# Patient Record
Sex: Male | Born: 1955
Health system: Southern US, Community
[De-identification: ages and names within clinical notes are randomized; demographics above are authoritative.]

## PROBLEM LIST (undated history)

## (undated) ENCOUNTER — Emergency Department (HOSPITAL_COMMUNITY): Admission: EM | Payer: Medicare Other

## (undated) DIAGNOSIS — E781 Pure hyperglyceridemia: Secondary | ICD-10-CM

## (undated) DIAGNOSIS — K5909 Other constipation: Secondary | ICD-10-CM

## (undated) DIAGNOSIS — H913 Deaf nonspeaking, not elsewhere classified: Secondary | ICD-10-CM

## (undated) DIAGNOSIS — K429 Umbilical hernia without obstruction or gangrene: Secondary | ICD-10-CM

## (undated) DIAGNOSIS — F200 Paranoid schizophrenia: Secondary | ICD-10-CM

## (undated) DIAGNOSIS — F79 Unspecified intellectual disabilities: Secondary | ICD-10-CM

## (undated) DIAGNOSIS — Z862 Personal history of diseases of the blood and blood-forming organs and certain disorders involving the immune mechanism: Secondary | ICD-10-CM

## (undated) DIAGNOSIS — L304 Erythema intertrigo: Secondary | ICD-10-CM

## (undated) HISTORY — DX: Other constipation: K59.09

## (undated) HISTORY — DX: Umbilical hernia without obstruction or gangrene: K42.9

## (undated) HISTORY — DX: Paranoid schizophrenia: F20.0

## (undated) HISTORY — DX: Unspecified intellectual disabilities: F79

## (undated) HISTORY — DX: Personal history of diseases of the blood and blood-forming organs and certain disorders involving the immune mechanism: Z86.2

## (undated) HISTORY — DX: Erythema intertrigo: L30.4

## (undated) HISTORY — DX: Deaf nonspeaking, not elsewhere classified: H91.3

## (undated) HISTORY — DX: Pure hyperglyceridemia: E78.1

---

## 1998-04-03 ENCOUNTER — Encounter: Admission: RE | Admit: 1998-04-03 | Discharge: 1998-04-03 | Payer: Self-pay | Admitting: Internal Medicine

## 1998-07-10 ENCOUNTER — Encounter: Admission: RE | Admit: 1998-07-10 | Discharge: 1998-07-10 | Payer: Self-pay | Admitting: Internal Medicine

## 1998-10-11 ENCOUNTER — Encounter: Admission: RE | Admit: 1998-10-11 | Discharge: 1998-10-11 | Payer: Self-pay | Admitting: Hematology and Oncology

## 1999-03-16 ENCOUNTER — Encounter: Admission: RE | Admit: 1999-03-16 | Discharge: 1999-03-16 | Payer: Self-pay | Admitting: Internal Medicine

## 1999-09-28 ENCOUNTER — Encounter: Admission: RE | Admit: 1999-09-28 | Discharge: 1999-09-28 | Payer: Self-pay | Admitting: Internal Medicine

## 1999-10-19 ENCOUNTER — Encounter: Admission: RE | Admit: 1999-10-19 | Discharge: 1999-10-19 | Payer: Self-pay | Admitting: Internal Medicine

## 2000-01-29 ENCOUNTER — Encounter: Admission: RE | Admit: 2000-01-29 | Discharge: 2000-01-29 | Payer: Self-pay | Admitting: Internal Medicine

## 2000-05-16 ENCOUNTER — Encounter: Admission: RE | Admit: 2000-05-16 | Discharge: 2000-05-16 | Payer: Self-pay | Admitting: Internal Medicine

## 2000-10-30 ENCOUNTER — Encounter: Admission: RE | Admit: 2000-10-30 | Discharge: 2000-10-30 | Payer: Self-pay | Admitting: Hematology and Oncology

## 2000-11-19 ENCOUNTER — Encounter: Admission: RE | Admit: 2000-11-19 | Discharge: 2000-11-19 | Payer: Self-pay | Admitting: Internal Medicine

## 2001-09-14 ENCOUNTER — Encounter: Admission: RE | Admit: 2001-09-14 | Discharge: 2001-09-14 | Payer: Self-pay

## 2001-10-23 ENCOUNTER — Encounter: Admission: RE | Admit: 2001-10-23 | Discharge: 2001-10-23 | Payer: Self-pay | Admitting: Internal Medicine

## 2002-04-20 ENCOUNTER — Emergency Department (HOSPITAL_COMMUNITY): Admission: EM | Admit: 2002-04-20 | Discharge: 2002-04-20 | Payer: Self-pay | Admitting: Emergency Medicine

## 2002-08-27 ENCOUNTER — Encounter: Admission: RE | Admit: 2002-08-27 | Discharge: 2002-08-27 | Payer: Self-pay | Admitting: Internal Medicine

## 2003-04-13 ENCOUNTER — Encounter: Admission: RE | Admit: 2003-04-13 | Discharge: 2003-04-13 | Payer: Self-pay | Admitting: Internal Medicine

## 2003-04-15 ENCOUNTER — Encounter: Admission: RE | Admit: 2003-04-15 | Discharge: 2003-04-15 | Payer: Self-pay | Admitting: Internal Medicine

## 2003-05-11 ENCOUNTER — Encounter: Admission: RE | Admit: 2003-05-11 | Discharge: 2003-05-11 | Payer: Self-pay | Admitting: Internal Medicine

## 2003-05-25 ENCOUNTER — Encounter: Admission: RE | Admit: 2003-05-25 | Discharge: 2003-05-25 | Payer: Self-pay | Admitting: Internal Medicine

## 2003-09-29 ENCOUNTER — Encounter: Admission: RE | Admit: 2003-09-29 | Discharge: 2003-09-29 | Payer: Self-pay | Admitting: Internal Medicine

## 2004-03-16 ENCOUNTER — Encounter: Admission: RE | Admit: 2004-03-16 | Discharge: 2004-03-16 | Payer: Self-pay | Admitting: Internal Medicine

## 2004-03-22 ENCOUNTER — Encounter: Admission: RE | Admit: 2004-03-22 | Discharge: 2004-03-22 | Payer: Self-pay | Admitting: Internal Medicine

## 2004-08-24 ENCOUNTER — Ambulatory Visit (HOSPITAL_COMMUNITY): Admission: RE | Admit: 2004-08-24 | Discharge: 2004-08-24 | Payer: Self-pay | Admitting: Internal Medicine

## 2004-08-24 ENCOUNTER — Ambulatory Visit: Payer: Self-pay | Admitting: Internal Medicine

## 2004-10-24 ENCOUNTER — Ambulatory Visit: Payer: Self-pay | Admitting: Internal Medicine

## 2005-03-08 ENCOUNTER — Ambulatory Visit: Payer: Self-pay | Admitting: Internal Medicine

## 2005-09-24 ENCOUNTER — Ambulatory Visit: Payer: Self-pay | Admitting: Internal Medicine

## 2005-11-15 ENCOUNTER — Emergency Department (HOSPITAL_COMMUNITY): Admission: EM | Admit: 2005-11-15 | Discharge: 2005-11-15 | Payer: Self-pay | Admitting: Emergency Medicine

## 2005-12-27 ENCOUNTER — Ambulatory Visit: Payer: Self-pay | Admitting: Internal Medicine

## 2006-04-01 ENCOUNTER — Ambulatory Visit: Payer: Self-pay | Admitting: Hospitalist

## 2006-08-04 ENCOUNTER — Emergency Department (HOSPITAL_COMMUNITY): Admission: EM | Admit: 2006-08-04 | Discharge: 2006-08-04 | Payer: Self-pay | Admitting: Family Medicine

## 2006-08-28 ENCOUNTER — Ambulatory Visit: Payer: Self-pay | Admitting: Hospitalist

## 2006-10-13 DIAGNOSIS — I10 Essential (primary) hypertension: Secondary | ICD-10-CM | POA: Insufficient documentation

## 2006-10-13 DIAGNOSIS — K029 Dental caries, unspecified: Secondary | ICD-10-CM

## 2006-10-13 DIAGNOSIS — F79 Unspecified intellectual disabilities: Secondary | ICD-10-CM

## 2006-10-13 DIAGNOSIS — H913 Deaf nonspeaking, not elsewhere classified: Secondary | ICD-10-CM

## 2006-10-13 DIAGNOSIS — F200 Paranoid schizophrenia: Secondary | ICD-10-CM | POA: Insufficient documentation

## 2006-11-18 DIAGNOSIS — E785 Hyperlipidemia, unspecified: Secondary | ICD-10-CM

## 2006-11-18 DIAGNOSIS — E1169 Type 2 diabetes mellitus with other specified complication: Secondary | ICD-10-CM | POA: Insufficient documentation

## 2007-05-19 ENCOUNTER — Ambulatory Visit: Payer: Self-pay | Admitting: *Deleted

## 2007-05-19 DIAGNOSIS — K5900 Constipation, unspecified: Secondary | ICD-10-CM | POA: Insufficient documentation

## 2007-06-04 ENCOUNTER — Telehealth: Payer: Self-pay | Admitting: *Deleted

## 2007-06-04 LAB — CONVERTED CEMR LAB
ALT: 19 units/L (ref 0–53)
Alkaline Phosphatase: 61 units/L (ref 39–117)
Calcium: 9.8 mg/dL (ref 8.4–10.5)
Chloride: 97 meq/L (ref 96–112)
Creatinine, Ser: 1.16 mg/dL (ref 0.40–1.50)
Glucose, Bld: 115 mg/dL — ABNORMAL HIGH (ref 70–99)
HCT: 41.5 % (ref 39.0–52.0)
MCV: 75.5 fL — ABNORMAL LOW (ref 78.0–100.0)
Platelets: 274 10*3/uL (ref 150–400)
Total Bilirubin: 0.5 mg/dL (ref 0.3–1.2)
Total Protein: 8.9 g/dL — ABNORMAL HIGH (ref 6.0–8.3)

## 2007-06-08 ENCOUNTER — Encounter (INDEPENDENT_AMBULATORY_CARE_PROVIDER_SITE_OTHER): Payer: Self-pay | Admitting: *Deleted

## 2007-06-08 ENCOUNTER — Ambulatory Visit: Payer: Self-pay | Admitting: Internal Medicine

## 2007-06-08 LAB — CONVERTED CEMR LAB
BUN: 12 mg/dL (ref 6–23)
CO2: 28 meq/L (ref 19–32)
Calcium: 9.8 mg/dL (ref 8.4–10.5)
Glucose, Bld: 109 mg/dL — ABNORMAL HIGH (ref 70–99)

## 2007-09-22 ENCOUNTER — Ambulatory Visit: Payer: Self-pay | Admitting: Hospitalist

## 2007-10-20 ENCOUNTER — Telehealth (INDEPENDENT_AMBULATORY_CARE_PROVIDER_SITE_OTHER): Payer: Self-pay | Admitting: *Deleted

## 2007-11-13 ENCOUNTER — Encounter (INDEPENDENT_AMBULATORY_CARE_PROVIDER_SITE_OTHER): Payer: Self-pay | Admitting: *Deleted

## 2007-11-13 ENCOUNTER — Ambulatory Visit: Payer: Self-pay | Admitting: Internal Medicine

## 2007-11-13 LAB — CONVERTED CEMR LAB
Albumin: 4.6 g/dL (ref 3.5–5.2)
CO2: 25 meq/L (ref 19–32)
Calcium: 9.6 mg/dL (ref 8.4–10.5)
Chloride: 104 meq/L (ref 96–112)
Creatinine, Ser: 1.08 mg/dL (ref 0.40–1.50)
Glucose, Bld: 96 mg/dL (ref 70–99)
LDL Cholesterol: 84 mg/dL (ref 0–99)
Potassium: 3.6 meq/L (ref 3.5–5.3)
Sodium: 143 meq/L (ref 135–145)
Total CHOL/HDL Ratio: 4.5
Total Protein: 8.3 g/dL (ref 6.0–8.3)
Triglycerides: 158 mg/dL — ABNORMAL HIGH (ref <150)
VLDL: 32 mg/dL (ref 0–40)

## 2007-11-29 ENCOUNTER — Emergency Department (HOSPITAL_COMMUNITY): Admission: EM | Admit: 2007-11-29 | Discharge: 2007-11-29 | Payer: Self-pay | Admitting: Emergency Medicine

## 2008-01-29 ENCOUNTER — Telehealth (INDEPENDENT_AMBULATORY_CARE_PROVIDER_SITE_OTHER): Payer: Self-pay | Admitting: *Deleted

## 2008-03-03 ENCOUNTER — Ambulatory Visit: Payer: Self-pay | Admitting: Hospitalist

## 2008-03-07 ENCOUNTER — Encounter: Payer: Self-pay | Admitting: Licensed Clinical Social Worker

## 2008-08-29 ENCOUNTER — Ambulatory Visit: Payer: Self-pay | Admitting: Internal Medicine

## 2008-12-19 ENCOUNTER — Encounter (INDEPENDENT_AMBULATORY_CARE_PROVIDER_SITE_OTHER): Payer: Self-pay | Admitting: Internal Medicine

## 2008-12-19 ENCOUNTER — Emergency Department (HOSPITAL_COMMUNITY): Admission: EM | Admit: 2008-12-19 | Discharge: 2008-12-19 | Payer: Self-pay | Admitting: Family Medicine

## 2008-12-26 ENCOUNTER — Ambulatory Visit: Payer: Self-pay | Admitting: Internal Medicine

## 2008-12-26 ENCOUNTER — Encounter (INDEPENDENT_AMBULATORY_CARE_PROVIDER_SITE_OTHER): Payer: Self-pay | Admitting: *Deleted

## 2008-12-29 ENCOUNTER — Telehealth (INDEPENDENT_AMBULATORY_CARE_PROVIDER_SITE_OTHER): Payer: Self-pay | Admitting: *Deleted

## 2009-01-06 ENCOUNTER — Telehealth (INDEPENDENT_AMBULATORY_CARE_PROVIDER_SITE_OTHER): Payer: Self-pay | Admitting: *Deleted

## 2009-01-16 ENCOUNTER — Telehealth (INDEPENDENT_AMBULATORY_CARE_PROVIDER_SITE_OTHER): Payer: Self-pay | Admitting: *Deleted

## 2009-01-18 ENCOUNTER — Telehealth (INDEPENDENT_AMBULATORY_CARE_PROVIDER_SITE_OTHER): Payer: Self-pay | Admitting: *Deleted

## 2009-03-15 ENCOUNTER — Telehealth (INDEPENDENT_AMBULATORY_CARE_PROVIDER_SITE_OTHER): Payer: Self-pay | Admitting: *Deleted

## 2009-04-04 ENCOUNTER — Encounter (INDEPENDENT_AMBULATORY_CARE_PROVIDER_SITE_OTHER): Payer: Self-pay | Admitting: *Deleted

## 2009-04-04 ENCOUNTER — Ambulatory Visit: Payer: Self-pay | Admitting: Infectious Disease

## 2009-04-04 LAB — CONVERTED CEMR LAB
ALT: 26 units/L (ref 0–53)
AST: 22 units/L (ref 0–37)
Albumin: 4.7 g/dL (ref 3.5–5.2)
BUN: 15 mg/dL (ref 6–23)
Calcium: 9.5 mg/dL (ref 8.4–10.5)
Chloride: 106 meq/L (ref 96–112)
HDL: 42 mg/dL (ref >39)
Potassium: 3.6 meq/L (ref 3.5–5.3)
Total Bilirubin: 0.3 mg/dL (ref 0.3–1.2)
Total CHOL/HDL Ratio: 4.2
Triglycerides: 209 mg/dL — ABNORMAL HIGH (ref <150)

## 2009-07-24 ENCOUNTER — Encounter: Payer: Self-pay | Admitting: Internal Medicine

## 2009-09-19 ENCOUNTER — Telehealth: Payer: Self-pay | Admitting: Internal Medicine

## 2009-09-21 ENCOUNTER — Ambulatory Visit: Payer: Self-pay | Admitting: Internal Medicine

## 2009-10-18 ENCOUNTER — Ambulatory Visit: Payer: Self-pay | Admitting: Infectious Disease

## 2009-10-18 DIAGNOSIS — J309 Allergic rhinitis, unspecified: Secondary | ICD-10-CM | POA: Insufficient documentation

## 2009-10-18 LAB — CONVERTED CEMR LAB
BUN: 13 mg/dL (ref 6–23)
Calcium: 9.2 mg/dL (ref 8.4–10.5)

## 2009-10-23 ENCOUNTER — Telehealth: Payer: Self-pay | Admitting: Internal Medicine

## 2009-11-30 ENCOUNTER — Telehealth (INDEPENDENT_AMBULATORY_CARE_PROVIDER_SITE_OTHER): Payer: Self-pay | Admitting: *Deleted

## 2009-12-08 ENCOUNTER — Telehealth: Payer: Self-pay | Admitting: Internal Medicine

## 2010-01-01 ENCOUNTER — Telehealth: Payer: Self-pay | Admitting: Internal Medicine

## 2010-01-08 ENCOUNTER — Telehealth: Payer: Self-pay | Admitting: Internal Medicine

## 2010-01-19 ENCOUNTER — Telehealth: Payer: Self-pay | Admitting: Internal Medicine

## 2010-03-20 ENCOUNTER — Ambulatory Visit: Payer: Self-pay | Admitting: Internal Medicine

## 2010-03-20 LAB — CONVERTED CEMR LAB
ALT: 30 units/L (ref 0–53)
Albumin: 4.6 g/dL (ref 3.5–5.2)
Alkaline Phosphatase: 78 units/L (ref 39–117)
Calcium: 9.3 mg/dL (ref 8.4–10.5)
Cholesterol: 178 mg/dL (ref 0–200)
LDL Cholesterol: 87 mg/dL (ref 0–99)
Total Bilirubin: 0.3 mg/dL (ref 0.3–1.2)
Total Protein: 8.2 g/dL (ref 6.0–8.3)
Triglycerides: 232 mg/dL — ABNORMAL HIGH (ref <150)
VLDL: 46 mg/dL — ABNORMAL HIGH (ref 0–40)

## 2010-03-27 DIAGNOSIS — E876 Hypokalemia: Secondary | ICD-10-CM | POA: Insufficient documentation

## 2010-06-22 ENCOUNTER — Telehealth: Payer: Self-pay | Admitting: Internal Medicine

## 2010-06-27 ENCOUNTER — Ambulatory Visit: Payer: Self-pay | Admitting: Internal Medicine

## 2010-06-27 LAB — CONVERTED CEMR LAB
BUN: 14 mg/dL (ref 6–23)
Chloride: 101 meq/L (ref 96–112)

## 2010-09-04 ENCOUNTER — Ambulatory Visit: Payer: Self-pay | Admitting: Internal Medicine

## 2010-11-07 ENCOUNTER — Ambulatory Visit: Payer: Self-pay

## 2010-12-07 ENCOUNTER — Ambulatory Visit: Admission: RE | Admit: 2010-12-07 | Discharge: 2010-12-07 | Payer: Self-pay | Source: Home / Self Care

## 2010-12-09 LAB — CONVERTED CEMR LAB
Calcium: 9.3 mg/dL (ref 8.4–10.5)
Chloride: 103 meq/L (ref 96–112)
Creatinine, Ser: 1.04 mg/dL (ref 0.40–1.50)
Sodium: 142 meq/L (ref 135–145)

## 2011-01-01 NOTE — Progress Notes (Signed)
Summary: med refill/gp  Phone Note Refill Request Message from:  Pharmacy on January 19, 2010 3:53 PM  Refills Requested: Medication #1:  LORATADINE 10 MG TABS Take 1 tablet by mouth once a day  Method Requested: Electronic Initial call taken by: Chinita Pester RN,  January 19, 2010 3:53 PM  Follow-up for Phone Call        Refill approved-nurse to complete    Prescriptions: LORATADINE 10 MG TABS (LORATADINE) Take 1 tablet by mouth once a day  #31 x 3   Entered and Authorized by:   Vassie Loll MD   Signed by:   Vassie Loll MD on 01/19/2010   Method used:   [facility]        [facility] [phone][facility] (retail)       [address]       [place], [place]  [address]       Ph: [phone] or [phone]       Fax: [phone]   RxID:   [id number]

## 2011-01-01 NOTE — Assessment & Plan Note (Signed)
Summary: checkup/pcp-Leonard Tucker/hla   Vital Signs:  Patient profile:   55 year old male Height:      67 inches Weight:      197.1 pounds BMI:     30.98 Temp:     99.3 degrees F oral Pulse rate:   76 / minute BP sitting:   110 / 70  (right arm)  Vitals Entered By: Filomena Jungling NT II (March 20, 2010 2:12 PM) CC: checkup visit Is Patient Diabetic? No Pain Assessment Patient in pain? no      Nutritional Status BMI of > 30 = obese  Have you ever been in a relationship where you felt threatened, hurt or afraid?No   Does patient need assistance? Functional Status Self care Ambulation Normal Comments lives with mother   Primary Care Provider:  [name]  CC:  checkup visit.  History of Present Illness: Leonard Tucker is a 55 y/o man who is hearing and speech impaired and has mental retardation with chronic paranoid schizophrenia (treated at [facility]). He is here with his mother Leonard Tucker) who is also his caregiver to f/u on chronic medical problems of HTN and HLD.  Patient is doing pretty good in general and denies any complaints at this point.   Patient reports to be compliant with his medications and her mother reports that he is doing better regarding healthier food choices and with portion control.  No further episodes of constipation.  BP 110/70  Preventive Screening-Counseling & Management  Alcohol-Tobacco     Alcohol drinks/day: 0     Smoking Status: never  Caffeine-Diet-Exercise     Caffeine use/day: 2  Problems Prior to Update: 1)  Allergic Rhinitis  (ICD-477.9) 2)  Constipation  (ICD-564.00) 3)  Hypertriglyceridemia  (ICD-272.1) 4)  Preventive Health Care  (ICD-V70.0) 5)  Deaf Mutism  (ICD-389.7) 6)  Paranoid Schizophrenia, Chronic  (ICD-295.32) 7)  Mental Retardation  (ICD-319) 8)  Dental Caries  (ICD-521.00) 9)  Hypertension  (ICD-401.9)  Current Problems (verified): 1)  Allergic Rhinitis  (ICD-477.9) 2)  Constipation   (ICD-564.00) 3)  Hypertriglyceridemia  (ICD-272.1) 4)  Preventive Health Care  (ICD-V70.0) 5)  Deaf Mutism  (ICD-389.7) 6)  Paranoid Schizophrenia, Chronic  (ICD-295.32) 7)  Mental Retardation  (ICD-319) 8)  Dental Caries  (ICD-521.00) 9)  Hypertension  (ICD-401.9)  Medications Prior to Update: 1)  Dyazide 37.5-25 Mg Caps (Triamterene-Hctz) .... Take 1 Tablet By Mouth Once A Day 2)  Atenolol 25 Mg Tabs (Atenolol) .... Take 1 Tablet By Mouth Once A Day 3)  Fluphenazine Hcl  Elix (Fluphenazine Hcl Elix) .... Depo Injections At [facility] Every 2 Weeks 4)  Cvs Stool Softener 100 Mg Caps (Docusate Sodium) .... Take 1 Tablet By Mouth Two Times A Day 5)  Omeprazole 20 Mg Cpdr (Omeprazole) .... Take 1 Tablet By Mouth Once A Day 6)  Loratadine 10 Mg Tabs (Loratadine) .... Take 1 Tablet By Mouth Once A Day 7)  Nasonex 50 Mcg/act Susp (Mometasone Furoate) .... 2 Spray Inside Each Nostril Every Day. 8)  Cheratussin Ac 100-10 Mg/46ml Syrp (Guaifenesin-Codeine) .Marland Kitchen.. 1-2 Teaspoon Every 12 Hours As Needed For Cough.  Current Medications (verified): 1)  Dyazide 37.5-25 Mg Caps (Triamterene-Hctz) .... Take 1 Tablet By Mouth Once A Day 2)  Atenolol 25 Mg Tabs (Atenolol) .... Take 1 Tablet By Mouth Once A Day 3)  Fluphenazine Hcl  Elix (Fluphenazine Hcl Elix) .... Depo Injections At [facility] Every 2 Weeks 4)  Cvs Stool Softener 100 Mg  Caps (Docusate Sodium) .... Take 1 Tablet By Mouth Two Times A Day 5)  Omeprazole 20 Mg Cpdr (Omeprazole) .... Take 1 Tablet By Mouth Once A Day 6)  Loratadine 10 Mg Tabs (Loratadine) .... Take 1 Tablet By Mouth Once A Day 7)  Nasonex 50 Mcg/act Susp (Mometasone Furoate) .... 2 Spray Inside Each Nostril Every Day. 8)  Cheratussin Ac 100-10 Mg/2ml Syrp (Guaifenesin-Codeine) .Marland Kitchen.. 1-2 Teaspoon Every 12 Hours As Needed For Cough.  Allergies (verified): 1)  ! * Dextromethorphan  Past History:  Past Medical History: Last updated: 04/04/2009 DEAF MUTISM with  MENTAL RETARDATION CHRONIC PARANOID SCHIZOPHRENIA    - goes to [facility] Health q 2 weeks for his fluphenazine injections. CHRONIC CONSTIPATION     a) complicated by HEMORROIDS    b) intermittent rectal bleeding HYPERTRIGLYCERIDEMIA DENTAL CARIES INTERTRIGO, tx'd in 2008 UMBILICAL HERNIA    - 1.5 cm diameter Hx of Iron deficiency anemia  Social History: Last updated: 11/13/2007 His mother, Leonard Tucker is his primary caretaker.  Risk Factors: Alcohol Use: 0 (03/20/2010) Caffeine Use: 2 (03/20/2010)  Risk Factors: Smoking Status: never (03/20/2010)  Review of Systems  The patient denies anorexia, fever, chest pain, syncope, dyspnea on exertion, prolonged cough, abdominal pain, melena, hematochezia, severe indigestion/heartburn, depression, and unusual weight change.    Physical Exam  General:  Middle-aged man in NAD. Alert, well-developed, well-nourished. Communicates with his mother by simplified sign language. Lungs:  normal respiratory effort, no intercostal retractions, no accessory muscle use, normal breath sounds, no crackles, and no wheezes.   Heart:  normal rate, regular rhythm, and no murmur.   Abdomen:  soft, non-tender, normal bowel sounds, no distention, no masses, no guarding, no rigidity, no hepatomegaly, and no splenomegaly. Positive umbilical hernia w/o signs of strangulation. Extremities:  No clubbing, cyanosis, edema, or deformity noted with normal full range of motion of all joints.   Neurologic:  Alert, awake & oriented. CN III-XII intact (except for his hearing impairment). normal gait. no focal deficit.   Impression & Recommendations:  Problem # 1:  ALLERGIC RHINITIS (ICD-477.9) Well controlled and stable with current regimen. will continue using loratadine and nasonex. Patient advised to avoid extended period of time outdoor during spring (in order to prevent over-exposure to pollen).  His updated medication list for this problem includes:    Loratadine  10 Mg Tabs (Loratadine) .Marland Kitchen... Take 1 tablet by mouth once a day    Nasonex 50 Mcg/act Susp (Mometasone furoate) .Marland Kitchen... 2 spray inside each nostril every day.  Problem # 2:  HYPERTENSION (ICD-401.9) Excellent controlled. No medication changes needed at this point. Patient renal function WNL and except for mild low potassium level all his electrolytes are stable.  His updated medication list for this problem includes:    Dyazide 37.5-25 Mg Caps (Triamterene-hctz) .Marland Kitchen... Take 1 tablet by mouth once a day    Atenolol 25 Mg Tabs (Atenolol) .Marland Kitchen... Take 1 tablet by mouth once a day  Problem # 3:  HYPERTRIGLYCERIDEMIA (ICD-272.1) Patient LDL was checked postprandially today and was 232; which is mildly elevated but not high enough to granted medication therapy. Will recommend low fat diet and exercise for now. LDL and HDL was in desirable level. Will recheck lipid profile in 6-8 months again.  Orders: T-Lipid Profile ([id number])  Problem # 4:  PARANOID SCHIZOPHRENIA, CHRONIC (ICD-295.32) Well controlled and tsble. He received his fluphenazine injection today w/o complications. He will continue following at mental health center. No SI or hallucinations appreciated.  Problem # 5:  HYPOKALEMIA (ICD-276.8) Most likely due to diuretics use. Will prescribed potassium by mouth times 1 dose and will start potassium by mouth daily for maintenance.  Complete Medication List: 1)  Dyazide 37.5-25 Mg Caps (Triamterene-hctz) .... Take 1 tablet by mouth once a day 2)  Atenolol 25 Mg Tabs (Atenolol) .... Take 1 tablet by mouth once a day 3)  Fluphenazine Hcl Elix (Fluphenazine hcl elix) .... Depo injections at behavioral health every 2 weeks 4)  Cvs Stool Softener 100 Mg Caps (Docusate sodium) .... Take 1 tablet by mouth two times a day 5)  Omeprazole 20 Mg Cpdr (Omeprazole) .... Take 1 tablet by mouth once a day 6)  Loratadine 10 Mg Tabs (Loratadine) .... Take 1 tablet by mouth once a day 7)   Nasonex 50 Mcg/act Susp (Mometasone furoate) .... 2 spray inside each nostril every day. 8)  Cheratussin Ac 100-10 Mg/83ml Syrp (Guaifenesin-codeine) .Marland Kitchen.. 1-2 teaspoon every 12 hours as needed for cough. 9)  Potassium Chloride Cr 10 Meq Cr-tabs (Potassium chloride) .... Start taking 1 tablet by mouth daily.  Other Orders: T-Comprehensive Metabolic Panel ([id number])  Patient Instructions: 1)  Please schedule a follow-up appointment in 6 months. 2)  Take your medications as prescribed. 3)  Follow a low fat diet. 4)  Limit your Sodium intake (Salt). 5)  You will be called with any abnormalities in the tests scheduled or performed today.  If you don't hear from [place] within a week from when the test was performed, you can assume that your test was normal. Prescriptions: POTASSIUM CHLORIDE CR 10 MEQ CR-TABS (POTASSIUM CHLORIDE) Start taking 1 tablet by mouth daily.  #31 x 4   Entered and Authorized by:   Vassie Loll MD   Signed by:   Vassie Loll MD on 03/27/2010   Method used:   [facility] to        [facility] [phone]* (retail)       [address]       [place], [place]  [address]       Ph: [phone] or [phone]       Fax: [phone]   RxID:   [id number]  Process Orders Check Orders Results:     [facility]: Check successful Tests Sent for requisitioning (March 27, 2010 11:32 AM):     03/20/2010: [facility] -- T-Lipid Profile [phone] (signed)     03/20/2010: [facility] -- T-Comprehensive Metabolic Panel [phone] (signed)    Prevention & Chronic Care Immunizations   Influenza vaccine: Fluvax MCR  (09/21/2009)   Influenza vaccine deferral: Deferred  (03/20/2010)    Tetanus booster: 10/18/2009: Tdap   Td booster deferral: Deferred  (03/20/2010)   Tetanus booster due: 10/19/2019    Pneumococcal vaccine: Not documented   Pneumococcal vaccine deferral: Deferred   (03/20/2010)  Colorectal Screening   Hemoccult: Not documented    Colonoscopy: Not documented   Colonoscopy action/deferral: Deferred  (03/20/2010)  Other Screening   PSA: Not documented   PSA action/deferral: Discussed-PSA declined  (03/20/2010)  Reports requested:   Last colonoscopy report requested.  Smoking status: never  (03/20/2010)  Lipids   Total Cholesterol: 178  (04/04/2009)   Lipid panel action/deferral: Lipid Panel ordered   LDL: 94  (04/04/2009)   LDL Direct: Not documented   HDL: 42  (04/04/2009)   Triglycerides: 209  (04/04/2009)    SGOT (AST): 22  (04/04/2009)   BMP action: Ordered  SGPT (ALT): 26  (04/04/2009) CMP ordered    Alkaline phosphatase: 67  (04/04/2009)   Total bilirubin: 0.3  (04/04/2009)    Lipid flowsheet reviewed?: Yes   Progress toward LDL goal: At goal  Hypertension   Last Blood Pressure: 110 / 70  (03/20/2010)   Serum creatinine: 1.09  (10/18/2009)   BMP action: Ordered   Serum potassium 3.3  (10/18/2009) CMP ordered     Hypertension flowsheet reviewed?: Yes   Progress toward BP goal: At goal  Self-Management Support :   Personal Goals (by the next clinic visit) :      Personal blood pressure goal: 140/90  (03/20/2010)     Personal LDL goal: 130  (03/20/2010)    Patient will work on the following items until the next clinic visit to reach self-care goals:     Medications and monitoring: take my medicines every day, bring all of my medications to every visit  (03/20/2010)     Eating: eat more vegetables, use fresh or frozen vegetables, eat foods that are low in salt, eat baked foods instead of fried foods, eat fruit for snacks and desserts, limit or avoid alcohol  (03/20/2010)     Activity: take a 30 minute walk every day, take the stairs instead of the elevator, park at the far end of the parking lot  (03/20/2010)    Hypertension self-management support: Written self-care plan  (03/20/2010)   Hypertension self-care plan  printed.    Lipid self-management support: Written self-care plan  (03/20/2010)   Lipid self-care plan printed.   Nursing Instructions: Request report of last colonoscopy

## 2011-01-01 NOTE — Progress Notes (Signed)
Summary: med refill/gp  Phone Note Refill Request Message from:  Fax from Pharmacy on December 08, 2009 10:26 AM  Refills Requested: Medication #1:  ATENOLOL 25 MG TABS Take 1 tablet by mouth once a day   Last Refilled: 10/06/2009  Method Requested: Electronic Initial call taken by: Chinita Pester RN,  December 08, 2009 10:26 AM  Follow-up for Phone Call        Refill approved-nurse to complete    Prescriptions: ATENOLOL 25 MG TABS (ATENOLOL) Take 1 tablet by mouth once a day  #31 x 3   Entered and Authorized by:   Vassie Loll MD   Signed by:   Vassie Loll MD on 12/08/2009   Method used:   Electronically to        [facility] [phone]* (retail)       [address]       [place], [place]  [address]       Ph: [phone] or [phone]       Fax: [phone]   RxID:   [id number]

## 2011-01-01 NOTE — Assessment & Plan Note (Signed)
Summary: potassium refill//kg  Nurse Visit   Allergies: 1)  ! * Dextromethorphan  Impression & Recommendations:  Problem # 1:  HYPOKALEMIA (ICD-276.8) Patient is supposed to continue taking his potassium pills, in order to maintain electrolytes in normal range. Cause of hypokalemia is the use of diuretics for his HTN treatment. [facility]  Complete Medication List: 1)  Dyazide 37.5-25 Mg Caps (Triamterene-hctz) .... Take 1 tablet by mouth once a day 2)  Atenolol 25 Mg Tabs (Atenolol) .... Take 1 tablet by mouth once a day 3)  Fluphenazine Hcl Elix (Fluphenazine hcl elix) .... Depo injections at behavioral health every 2 weeks 4)  Cvs Stool Softener 100 Mg Caps (Docusate sodium) .... Take 1 tablet by mouth two times a day 5)  Omeprazole 20 Mg Cpdr (Omeprazole) .... Take 1 tablet by mouth once a day 6)  Loratadine 10 Mg Tabs (Loratadine) .... Take 1 tablet by mouth once a day 7)  Nasonex 50 Mcg/act Susp (Mometasone furoate) .... 2 spray inside each nostril every day. 8)  Cheratussin Ac 100-10 Mg/85ml Syrp (Guaifenesin-codeine) .Marland Kitchen.. 1-2 teaspoon every 12 hours as needed for cough. 9)  Potassium Chloride Cr 10 Meq Cr-tabs (Potassium chloride) .... Start taking 1 tablet by mouth daily.  Other Orders: Flu Vaccine 69yrs + MEDICARE PATIENTS (Z6109) Administration Flu vaccine - MCR (G0008)   Orders Added: 1)  Flu Vaccine 44yrs + MEDICARE PATIENTS [Q2039] 2)  Administration Flu vaccine - MCR [G0008] Prescriptions: POTASSIUM CHLORIDE CR 10 MEQ CR-TABS (POTASSIUM CHLORIDE) Start taking 1 tablet by mouth daily.  #31 x 6   Entered and Authorized by:   Vassie Loll MD   Signed by:   Vassie Loll MD on 09/04/2010   Method used:   Electronically to        [facility] [phone]* (retail)       [address]       [place], [place]  [address]       Ph: [phone] or [phone]       Fax: [phone]   RxID:    [id number]  Flu Vaccine Consent Questions     Do you have a history of severe allergic reactions to this vaccine? no    Any prior history of allergic reactions to egg and/or gelatin? no    Do you have a sensitivity to the preservative Thimersol? no    Do you have a past history of Guillan-Barre Syndrome? no    Do you currently have an acute febrile illness? no    Have you ever had a severe reaction to latex? no    Vaccine information given and explained to patient? yes    Are you currently pregnant? no    Lot Number:[id number]   Exp Date:06/01/2011   Manufacturer: [employer]    Site Given  Left Deltoid IM.Cynda Familia [facility])  September 04, 2010 2:50 PM Phone Note Other Incoming   Summary of Call: Pt here for flu vaccine and pt's mother presents an empty on potassium 10mg . Per pharmacy last filled in Aug 2011.  Pt's mother wants to know if pt still needs to take, please advise and send in rx accordingly, [facility] [place] Initial call taken by: Cynda Familia [facility]ull),  September 04, 2010 2:53 PM    .Neysa Bonito

## 2011-01-01 NOTE — Assessment & Plan Note (Signed)
Summary: FU VISIT/DS   Vital Signs:  Patient profile:   55 year old male Height:      67 inches (170.18 cm) Weight:      201.9 pounds (91.77 kg) BMI:     31.74 Temp:     97.9 degrees F (36.61 degrees C) oral Pulse rate:   97 / minute BP sitting:   131 / 81  (left arm) Cuff size:   regular  Vitals Entered By: Cynda Familia Duncan Dull) (June 27, 2010 10:11 AM) CC: here for routine f/u, has not been taking atenolol, insurance will not pay for loratadine, needs something different Is Patient Diabetic? No Pain Assessment Patient in pain? no      Nutritional Status BMI of > 30 = obese  Have you ever been in a relationship where you felt threatened, hurt or afraid?Unable to ask   Does patient need assistance? Functional Status Cook/clean, Shopping, Social activities Ambulation Normal Comments pt is a deaf/mute   Primary Care Provider:  [name]  CC:  here for routine f/u, has not been taking atenolol, insurance will not pay for loratadine, and needs something different.  History of Present Illness: Leonard Tucker is a 55 y/o man who is hearing and speech impaired and has mental retardation with chronic paranoid schizophrenia (treated at [facility]). He is here with his mother Leonard Tucker) who is also his caregiver to f/u on chronic medical problems.  Patient is doing pretty good in general and denies any complaints at this point.   They are having some issues with the loratadine and inssurance coverage and they are requesting for a different drug.  Patient also reports that after stopping his loratadine for couple of days, he has been coughing in the am (dry cough).  Preventive Screening-Counseling & Management  Alcohol-Tobacco     Alcohol drinks/day: 0     Smoking Status: never  Problems Prior to Update: 1)  Hypokalemia  (ICD-276.8) 2)  Allergic Rhinitis  (ICD-477.9) 3)  Constipation  (ICD-564.00) 4)  Hypertriglyceridemia  (ICD-272.1) 5)  Preventive  Health Care  (ICD-V70.0) 6)  Deaf Mutism  (ICD-389.7) 7)  Paranoid Schizophrenia, Chronic  (ICD-295.32) 8)  Mental Retardation  (ICD-319) 9)  Dental Caries  (ICD-521.00) 10)  Hypertension  (ICD-401.9)  Current Problems (verified): 1)  Hypokalemia  (ICD-276.8) 2)  Allergic Rhinitis  (ICD-477.9) 3)  Constipation  (ICD-564.00) 4)  Hypertriglyceridemia  (ICD-272.1) 5)  Preventive Health Care  (ICD-V70.0) 6)  Deaf Mutism  (ICD-389.7) 7)  Paranoid Schizophrenia, Chronic  (ICD-295.32) 8)  Mental Retardation  (ICD-319) 9)  Dental Caries  (ICD-521.00) 10)  Hypertension  (ICD-401.9)  Medications Prior to Update: 1)  Dyazide 37.5-25 Mg Caps (Triamterene-Hctz) .... Take 1 Tablet By Mouth Once A Day 2)  Atenolol 25 Mg Tabs (Atenolol) .... Take 1 Tablet By Mouth Once A Day 3)  Fluphenazine Hcl  Elix (Fluphenazine Hcl Elix) .... Depo Injections At [facility] Every 2 Weeks 4)  [facility]vs Stool Softener 100 Mg Caps (Docusate Sodium) .... Take 1 Tablet By Mouth Two Times A Day 5)  Omeprazole 20 Mg Cpdr (Omeprazole) .... Take 1 Tablet By Mouth Once A Day 6)  Loratadine 10 Mg Tabs (Loratadine) .... Take 1 Tablet By Mouth Once A Day 7)  Nasonex 50 Mcg/act Susp (Mometasone Furoate) .... 2 Spray Inside Each Nostril Every Day. 8)  Cheratussin Ac 100-10 Mg/14ml Syrp (Guaifenesin-Codeine) .Marland Kitchen.. 1-2 Teaspoon Every 12 Hours As Needed For Cough. 9)  Potassium Chloride Cr 10 Meq  Cr-Tabs (Potassium Chloride) .... Start Taking 1 Tablet By Mouth Daily.  Current Medications (verified): 1)  Dyazide 37.5-25 Mg Caps (Triamterene-Hctz) .... Take 1 Tablet By Mouth Once A Day 2)  Atenolol 25 Mg Tabs (Atenolol) .... Take 1 Tablet By Mouth Once A Day 3)  Fluphenazine Hcl  Elix (Fluphenazine Hcl Elix) .... Depo Injections At [facility] Every 2 Weeks 4)  [facility]vs Stool Softener 100 Mg Caps (Docusate Sodium) .... Take 1 Tablet By Mouth Two Times A Day 5)  Omeprazole 20 Mg Cpdr (Omeprazole) .... Take 1 Tablet By Mouth Once A  Day 6)  Loratadine 10 Mg Tabs (Loratadine) .... Take 1 Tablet By Mouth Once A Day 7)  Nasonex 50 Mcg/act Susp (Mometasone Furoate) .... 2 Spray Inside Each Nostril Every Day. 8)  Cheratussin Ac 100-10 Mg/87ml Syrp (Guaifenesin-Codeine) .Marland Kitchen.. 1-2 Teaspoon Every 12 Hours As Needed For Cough. 9)  Potassium Chloride Cr 10 Meq Cr-Tabs (Potassium Chloride) .... Start Taking 1 Tablet By Mouth Daily.  Allergies: 1)  ! * Dextromethorphan  Past History:  Past Medical History: Last updated: 04/04/2009 DEAF MUTISM with MENTAL RETARDATION CHRONIC PARANOID SCHIZOPHRENIA    - [facility] to [facility] 2 [facility] for his fluphenazine injections. CHRONIC CONSTIPATION     a) complicated by HEMORROIDS    b) intermittent rectal bleeding HYPERTRIGLYCERIDEMIA DENTAL CARIES INTERTRIGO, tx'd in 2008 UMBILICAL HERNIA    - 1.5 cm diameter Hx of Iron deficiency anemia  Social History: Last updated: 11/13/2007 His mother, Leonard Tucker is his primary caretaker.  Risk Factors: Alcohol Use: 0 (06/27/2010) Caffeine Use: 2 (03/20/2010)  Risk Factors: Smoking Status: never (06/27/2010)  Review of Systems       As per HPI.  Physical Exam  General:  Middle-aged man in NAD. Alert, well-developed, well-nourished. Communicates with his mother by simplified sign language. Lungs:  normal respiratory effort, no intercostal retractions, no accessory muscle use, normal breath sounds, no crackles, and no wheezes.   Heart:  normal rate, regular rhythm, and no murmur.   Abdomen:  soft, non-tender, normal bowel sounds, no distention, no masses, no guarding, no rigidity, no hepatomegaly, and no splenomegaly. Positive umbilical hernia w/o signs of strangulation. Extremities:  No clubbing, cyanosis, edema, or deformity noted with normal full range of motion of all joints.   Neurologic:  Alert, awake & oriented. CN III-XII intact (except for his hearing impairment). normal gait. no focal deficit.   Impression &  Recommendations:  Problem # 1:  HYPOKALEMIA (ICD-276.8) Patient potassium was checked today, and was 3.4; talking to mother she said that for the last 3 days he has not take his potassium; because is to big. Patient and family has been instructed to be compliant with medications and educated about how to cut in half or even crushed for esier swallowing process. They reports to be compliant with medication as directed.  Orders: T-Basic Metabolic Panel [id number])  Problem # 2:  ALLERGIC RHINITIS (ICD-477.9) Patient has stopped loratadine for while due to price. Mother finally got medication; patient advised on how to use it and to call us if symptoms failed to improved. Mom will call insurance and will find out coverage for allegra and alavert; will change to any of them if better prices and cheaper copay is achieved.  His updated medication list for this problem includes:    Loratadine 10 Mg Tabs (Loratadine) .Marland Kitchen... Take 1 tablet by mouth once a day    Nasonex 50 Mcg/act Susp (Mometasone furoate) .Marland Kitchen... 2 spray inside each  nostril every day.  Problem # 3:  HYPERTENSION (ICD-401.9) Well controlled. will continue current regimen'. Patient advised to follow a low sodium diet.  His updated medication list for this problem includes:    Dyazide 37.5-25 Mg Caps (Triamterene-hctz) .Marland Kitchen... Take 1 tablet by mouth once a day    Atenolol 25 Mg Tabs (Atenolol) .Marland Kitchen... Take 1 tablet by mouth once a day  Problem # 4:  HYPERTRIGLYCERIDEMIA (ICD-272.1) Will continue encouraging him to follow a low fat diet and to lose weight. Last labs demonstarted improved on his lipid profile; will hold adding any medications at this time for high triglycerides.  Complete Medication List: 1)  Dyazide 37.5-25 Mg Caps (Triamterene-hctz) .... Take 1 tablet by mouth once a day 2)  Atenolol 25 Mg Tabs (Atenolol) .... Take 1 tablet by mouth once a day 3)  Fluphenazine Hcl Elix (Fluphenazine hcl elix) .... Depo injections at  behavioral health every 2 weeks 4)  Cvs Stool Softener 100 Mg Caps (Docusate sodium) .... Take 1 tablet by mouth two times a day 5)  Omeprazole 20 Mg Cpdr (Omeprazole) .... Take 1 tablet by mouth once a day 6)  Loratadine 10 Mg Tabs (Loratadine) .... Take 1 tablet by mouth once a day 7)  Nasonex 50 Mcg/act Susp (Mometasone furoate) .... 2 spray inside each nostril every day. 8)  Cheratussin Ac 100-10 Mg/56ml Syrp (Guaifenesin-codeine) .Marland Kitchen.. 1-2 teaspoon every 12 hours as needed for cough. 9)  Potassium Chloride Cr 10 Meq Cr-tabs (Potassium chloride) .... Start taking 1 tablet by mouth daily.  Patient Instructions: 1)  Please schedule a follow-up appointment in 4 months. 2)  Take your medications as prescribed. 3)  Follow a low fat diet. 4)  Limit your Sodium intake (Salt). 5)  You will be called with any abnormalities in the tests scheduled or performed today.  If you don't hear from [place] within a week from when the test was performed, you can assume that your test was normal. 6)  Remember to ask the pharmacy about medication coverage by Medicare (loratadine Vs Alavert). Process Orders Check Orders Results:     [facility]: Check successful Tests Sent for requisitioning (July 08, 2010 8:16 PM):     06/27/2010: [facility] -- T-Basic Metabolic Panel [phone] (signed)    Prevention & Chronic Care Immunizations   Influenza vaccine: Fluvax MCR  (09/21/2009)   Influenza vaccine deferral: Deferred  (03/20/2010)    Tetanus booster: 10/18/2009: Tdap   Td booster deferral: Deferred  (03/20/2010)   Tetanus booster due: 10/19/2019    Pneumococcal vaccine: Not documented   Pneumococcal vaccine deferral: Deferred  (03/20/2010)  Colorectal Screening   Hemoccult: Not documented    Colonoscopy: Not documented   Colonoscopy action/deferral: Deferred  (03/20/2010)  Other Screening   PSA: Not documented   PSA action/deferral: Discussed-PSA declined   (03/20/2010)   Smoking status: never  (06/27/2010)  Lipids   Total Cholesterol: 178  (03/20/2010)   Lipid panel action/deferral: Lipid Panel ordered   LDL: 87  (03/20/2010)   LDL Direct: Not documented   HDL: 45  (03/20/2010)   Triglycerides: 232  (03/20/2010)    SGOT (AST): 23  (03/20/2010)   BMP action: Ordered   SGPT (ALT): 30  (03/20/2010)   Alkaline phosphatase: 78  (03/20/2010)   Total bilirubin: 0.3  (03/20/2010)  Hypertension   Last Blood Pressure: 131 / 81  (06/27/2010)   Serum creatinine: 0.99  (03/20/2010)   BMP action: Ordered   Serum potassium  3.3  (03/20/2010)  Self-Management Support :   Personal Goals (by the next clinic visit) :      Personal blood pressure goal: 140/90  (03/20/2010)     Personal LDL goal: 130  (03/20/2010)    Patient will work on the following items until the next clinic visit to reach self-care goals:     Medications and monitoring: take my medicines every day  (06/27/2010)     Eating: eat foods that are low in salt, eat baked foods instead of fried foods  (06/27/2010)     Activity: take a 30 minute walk every day, take the stairs instead of the elevator, park at the far end of the parking lot  (03/20/2010)    Hypertension self-management support: Pre-printed educational material, Written self-care plan, Resources for patients handout  (06/27/2010)   Hypertension self-care plan printed.    Lipid self-management support: Pre-printed educational material, Written self-care plan, Resources for patients handout  (06/27/2010)   Lipid self-care plan printed.      Resource handout printed.

## 2011-01-01 NOTE — Progress Notes (Signed)
Summary: Refill/gh  Phone Note Refill Request Message from:  Fax from Pharmacy on January 01, 2010 11:18 AM  Refills Requested: Medication #1:  OMEPRAZOLE 20 MG CPDR Take 1 tablet by mouth once a day   Last Refilled: 12/04/2009  Method Requested: Electronic Initial call taken by: Angelina Ok RN,  January 01, 2010 11:18 AM  Follow-up for Phone Call        Refill approved-nurse to complete    Prescriptions: OMEPRAZOLE 20 MG CPDR (OMEPRAZOLE) Take 1 tablet by mouth once a day  #30 x 10   Entered and Authorized by:   Vassie Loll MD   Signed by:   Vassie Loll MD on 01/02/2010   Method used:   [facility] to        [facility] [phone]* (retail)       [address]       [place], [place]  [address]       Ph: [phone] or [phone]       Fax: [phone]   RxID:   [id number]

## 2011-01-01 NOTE — Progress Notes (Signed)
Summary: refill/ hla  Phone Note Refill Request Message from:  Pharmacy on June 22, 2010 3:58 PM  Refills Requested: Medication #1:  ATENOLOL 25 MG TABS Take 1 tablet by mouth once a day   Dosage confirmed as above?Dosage Confirmed  Medication #2:  LORATADINE 10 MG TABS Take 1 tablet by mouth once a day   Dosage confirmed as above?Dosage Confirmed Initial call taken by: Marin Roberts RN,  June 22, 2010 3:59 PM  Follow-up for Phone Call        Refill approved-nurse to complete    Prescriptions: LORATADINE 10 MG TABS (LORATADINE) Take 1 tablet by mouth once a day  #31 x 6   Entered and Authorized by:   Vassie Loll MD   Signed by:   Vassie Loll MD on 06/22/2010   Method used:   [facility] to        [facility] [phone]* (retail)       [address]       [place], [place]  [address]       Ph: [phone] or [phone]       Fax: [phone]   RxID:   [id number] ATENOLOL 25 MG TABS (ATENOLOL) Take 1 tablet by mouth once a day  #31 x 4   Entered and Authorized by:   Vassie Loll MD   Signed by:   Vassie Loll MD on 06/22/2010   Method used:   Electronically to        [facility] [phone]* (retail)       [address]       [place], [place]  [address]       Ph: [phone] or [phone]       Fax: [phone]   RxID:   [id number]

## 2011-01-01 NOTE — Progress Notes (Signed)
Summary: med refill/gp  Phone Note Refill Request Message from:  Fax from Pharmacy on January 08, 2010 10:53 AM  Refills Requested: Medication #1:  [facility] STOOL SOFTENER 100 MG CAPS Take 1 tablet by mouth two times a day   Last Refilled: 12/08/2009  Method Requested: Electronic Initial call taken by: Chinita Pester RN,  January 08, 2010 10:53 AM  Follow-up for Phone Call        Refill approved-nurse to complete    Prescriptions: [facility] STOOL SOFTENER 100 MG CAPS (DOCUSATE SODIUM) Take 1 tablet by mouth two times a day  #60 x 11   Entered and Authorized by:   Vassie Loll MD   Signed by:   Vassie Loll MD on 01/09/2010   Method used:   Electronically to        [facility] [phone]* (retail)       [address]       [place], [place]  [address]       Ph: [phone] or [phone]       Fax: [phone]   RxID:   [id number]

## 2011-01-03 NOTE — Assessment & Plan Note (Signed)
Summary: CHECKUP/SB.   Vital Signs:  Patient profile:   55 year old male Height:      67 inches (170.18 cm) Weight:      208.5 pounds (94.77 kg) BMI:     32.77 Temp:     96.8 degrees F (36 degrees C) oral Pulse rate:   79 / minute BP sitting:   121 / 79  (left arm)  Vitals Entered By: Leonard Kidney Ditzler RN (December 07, 2010 2:33 PM) Is Patient Diabetic? No Pain Assessment Patient in pain? no      Nutritional Status BMI of > 30 = obese Nutritional Status Detail appetite good  Have you ever been in a relationship where you felt threatened, hurt or afraid?denies   Does patient need assistance? Functional Status Self care Ambulation Normal Comments Mother with pt. Ck-up and refills on meds.   Primary Care Provider:  Leodis Sias MD   History of Present Illness: Patient is a 55 year old man who presents today for follow up of his chronic medical conditions including hypertension, and hypokalemia.  He is deaf and mute but his mother is her and she reports that he has been taking his medications as prescribed.  Her only concern for him is that he is eating a lot and even waking up at night to eat.  He is up approximately 15 lbs from 1.5 years ago.  Otherwise he has not active complaints.   Depression History:      The patient denies a depressed mood most of the day and a diminished interest in his usual daily activities.        The patient denies that he feels like life is not worth living, denies that he wishes that he were dead, and denies that he has thought about ending his life.         Preventive Screening-Counseling & Management  Alcohol-Tobacco     Alcohol drinks/day: 0     Smoking Status: never  Caffeine-Diet-Exercise     Caffeine use/day: 2  Current Medications (verified): 1)  Dyazide 37.5-25 Mg Caps (Triamterene-Hctz) .... Take 1 Tablet By Mouth Once A Day 2)  Atenolol 25 Mg Tabs (Atenolol) .... Take 1 Tablet By Mouth Once A Day 3)  Fluphenazine Hcl  Elix  (Fluphenazine Hcl Elix) .... Depo Injections At [facility] Every 2 Weeks 4)  Cvs Stool Softener 100 Mg Caps (Docusate Sodium) .... Take 1 Tablet By Mouth Two Times A Day 5)  Omeprazole 20 Mg Cpdr (Omeprazole) .... Take 1 Tablet By Mouth Once A Day 6)  Loratadine 10 Mg Tabs (Loratadine) .... Take 1 Tablet By Mouth Once A Day 7)  Nasonex 50 Mcg/act Susp (Mometasone Furoate) .... 2 Spray Inside Each Nostril Every Day. 8)  Potassium Chloride Cr 10 Meq Cr-Tabs (Potassium Chloride) .... Start Taking 1 Tablet By Mouth Daily.  Allergies: 1)  ! * Dextromethorphan  Past History:  Past medical, surgical, family and social histories (including risk factors) reviewed, and no changes noted (except as noted below).  Past Medical History: Reviewed history from 04/04/2009 and no changes required. DEAF MUTISM with MENTAL RETARDATION CHRONIC PARANOID SCHIZOPHRENIA    - goes to [facility] q 2 weeks for his fluphenazine injections. CHRONIC CONSTIPATION     a) complicated by HEMORROIDS    b) intermittent rectal bleeding HYPERTRIGLYCERIDEMIA DENTAL CARIES INTERTRIGO, tx'd in 2008 UMBILICAL HERNIA    - 1.5 cm diameter Hx of Iron deficiency anemia  Family History: Reviewed history and no changes  required.  Social History: Reviewed history from 11/13/2007 and no changes required. His mother, Leonard Tucker is his primary caretaker.  Physical Exam  Additional Exam:  General: Well developed, male in no acute distress.  Vital signs reviewed.  Head: Atraumatic, normocephalic with no signs of trauma  Eyes: PERRLA, EOM intact  Ears: TM intact  Nose: Nares patent, mucosa is pink and moist, no polyps noted  Mouth: Mucosa is pink and moist, good dention,   Neck: Supple, full ROM, no thyromegaly or masses noted  Resp: Clear to ascultation bilaterally, no wheezes, rales, or rhonchi noted  CV: Regular rate and rhythm with no murmurs, rubs, or gallops noted  Abdomen: Soft, obese, non-tender,  non-distended with normal bowel sounds  Musculoskelatal: ROM full with intact strength, no pain to palpation  Neurologic: Alert and oriented x3, Cranial nerves II-XII grossly intact, DTR normal and symmetric  Pulses: Radial, brachial, carotid, femoral, dorsal pedis, and posterior tibial pulses equal and symmetric     Impression & Recommendations:  Problem # 1:  HYPERTENSION (ICD-401.9) His blood pressure is excellent today.  He should continue on his same medication regimen and follow up in 1 year for this or as needed for other problems.   His updated medication list for this problem includes:    Dyazide 37.5-25 Mg Caps (Triamterene-hctz) .Marland Kitchen... Take 1 tablet by mouth once a day    Atenolol 25 Mg Tabs (Atenolol) .Marland Kitchen... Take 1 tablet by mouth once a day  Orders: T-Basic Metabolic Panel [id number])  BP today: 121/79 Prior BP: 131/81 (06/27/2010)  Labs Reviewed: K+: 3.4 (06/27/2010) Creat: : 1.07 (06/27/2010)   Chol: 178 (03/20/2010)   HDL: 45 (03/20/2010)   LDL: 87 (03/20/2010)   TG: 232 (03/20/2010)  Problem # 2:  HYPERTRIGLYCERIDEMIA (ICD-272.1) HIs triglycerides continue to be a bit high.  We are working on managing this with his diet.  Several options were discussed to try to help him lose some weight including switching from regular soda to diet soda, diet juices, and watching his fat intake.  Also increasing his activity would help as well.    Labs Reviewed: SGOT: 23 (03/20/2010)   SGPT: 30 (03/20/2010)   HDL:45 (03/20/2010), 42 (04/04/2009)  LDL:87 (03/20/2010), 94 (04/04/2009)  Chol:178 (03/20/2010), 178 (04/04/2009)  Trig:232 (03/20/2010), 209 (04/04/2009)  Problem # 3:  HYPOKALEMIA (ICD-276.8) HE is due for his monitoring of his hypokalemia.  The hypokalemia is likely from his diuretic use and he is currently on 10 mEq oral supplement daily.  Problem # 4:  PREVENTIVE HEALTH CARE (ICD-V70.0) He is up to date on his vaccinations.  He does however need colon cancer  screening.  Our discussions in th office ran long today so we will plan to discuss it at his next appointment.    Complete Medication List: 1)  Dyazide 37.5-25 Mg Caps (Triamterene-hctz) .... Take 1 tablet by mouth once a day 2)  Atenolol 25 Mg Tabs (Atenolol) .... Take 1 tablet by mouth once a day 3)  Fluphenazine Hcl Elix (Fluphenazine hcl elix) .... Depo injections at behavioral health every 2 weeks 4)  Cvs Stool Softener 100 Mg Caps (Docusate sodium) .... Take 1 tablet by mouth two times a day 5)  Omeprazole 20 Mg Cpdr (Omeprazole) .... Take 1 tablet by mouth once a day 6)  Loratadine 10 Mg Tabs (Loratadine) .... Take 1 tablet by mouth once a day 7)  Nasonex 50 Mcg/act Susp (Mometasone furoate) .... 2 spray inside each nostril every day. 8)  Potassium  Chloride Cr 10 Meq Cr-tabs (Potassium chloride) .... Start taking 1 tablet by mouth daily.  Patient Instructions: 1)  Continue taking medications as prescribed. 2)  Stop in the lab for your testing.  If there is something to follow up on I will call. 3)  See me back in 3 months. Prescriptions: POTASSIUM CHLORIDE CR 10 MEQ CR-TABS (POTASSIUM CHLORIDE) Start taking 1 tablet by mouth daily.  #31 x 6   Entered and Authorized by:   Leonard Sias MD   Signed by:   Leonard Sias MD on 12/07/2010   Method used:   [facility] to        [facility] [phone][facility] (retail)       [address]       [place], [place]  [address]       Ph: [phone] or [phone]       Fax: [phone]   RxID:   [id number] NASONEX 50 MCG/ACT SUSP (MOMETASONE FUROATE) 2 spray inside each nostril every day.  #1 x 2   Entered and Authorized by:   Leonard Sias MD   Signed by:   Leonard Sias MD on 12/07/2010   Method used:   [facility] to        [facility] [phone][facility] (retail)       [address]       [place], [place]  [address]       Ph: [phone] or [phone]        Fax: [phone]   RxID:   [id number] LORATADINE 10 MG TABS (LORATADINE) Take 1 tablet by mouth once a day  #31 x 6   Entered and Authorized by:   Leonard Sias MD   Signed by:   Leonard Sias MD on 12/07/2010   Method used:   [facility] to        [facility] [phone]* (retail)       [address]       [place], [place]  [address]       Ph: [phone] or [phone]       Fax: [phone]   RxID:   [id number] OMEPRAZOLE 20 MG CPDR (OMEPRAZOLE) Take 1 tablet by mouth once a day  #30 x 10   Entered and Authorized by:   Leonard Sias MD   Signed by:   Leonard Sias MD on 12/07/2010   Method used:   [facility] to        [facility] [phone]* (retail)       [address]       [place], [place]  [address]       Ph: [phone] or [phone]       Fax: [phone]   RxID:   [id number] ATENOLOL 25 MG TABS (ATENOLOL) Take 1 tablet by mouth once a day  #31 x 4   Entered and Authorized by:   Leonard Sias MD   Signed by:   Leonard Sias MD on 12/07/2010   Method used:   [facility] to        [facility] [phone]* (retail)       [address]       [place], [place]  [address]  Ph: [phone] or [phone]       Fax: [phone]   RxID:   [id number] DYAZIDE 37.5-25 MG CAPS (TRIAMTERENE-HCTZ) Take 1 tablet by mouth once a day  #31 x 11   Entered and Authorized by:   Leonard Sias MD   Signed by:   Leonard Sias MD on 12/07/2010   Method used:   [facility] to        [facility] [phone]* (retail)       [address]       [place], [place]  [address]       Ph: [phone] or [phone]       Fax: [phone]   RxID:   [id number]    Orders Added: 1)  T-Basic Metabolic Panel [phone] 2)  Est. Patient Level III [[address]]   Process Orders Check Orders  Results:     [facility]: Check successful Tests Sent for requisitioning (December 09, 2010 3:06 PM):     12/07/2010: [facility] -- T-Basic Metabolic Panel [phone] (signed)     Prevention & Chronic Care Immunizations   Influenza vaccine: Fluvax 3+  (09/04/2010)   Influenza vaccine deferral: Deferred  (03/20/2010)    Tetanus booster: 10/18/2009: Tdap   Td booster deferral: Deferred  (03/20/2010)   Tetanus booster due: 10/19/2019    Pneumococcal vaccine: Not documented   Pneumococcal vaccine deferral: Deferred  (03/20/2010)  Colorectal Screening   Hemoccult: Not documented    Colonoscopy: Not documented   Colonoscopy action/deferral: Deferred  (03/20/2010)  Other Screening   PSA: Not documented   PSA action/deferral: Discussed-PSA declined  (03/20/2010)   Smoking status: never  (12/07/2010)  Lipids   Total Cholesterol: 178  (03/20/2010)   Lipid panel action/deferral: Lipid Panel ordered   LDL: 87  (03/20/2010)   LDL Direct: Not documented   HDL: 45  (03/20/2010)   Triglycerides: 232  (03/20/2010)    SGOT (AST): 23  (03/20/2010)   BMP action: Ordered   SGPT (ALT): 30  (03/20/2010)   Alkaline phosphatase: 78  (03/20/2010)   Total bilirubin: 0.3  (03/20/2010)    Lipid flowsheet reviewed?: Yes   Progress toward LDL goal: Unchanged  Hypertension   Last Blood Pressure: 121 / 79  (12/07/2010)   Serum creatinine: 1.07  (06/27/2010)   BMP action: Ordered   Serum potassium 3.4  (06/27/2010)    Hypertension flowsheet reviewed?: Yes   Progress toward BP goal: At goal  Self-Management Support :   Personal Goals (by the next clinic visit) :      Personal blood pressure goal: 140/90  (03/20/2010)     Personal LDL goal: 130  (03/20/2010)    Patient will work on the following items until the next clinic visit to reach self-care goals:     Medications and monitoring: take my medicines every day, bring all of my medications to every  visit  (12/07/2010)     Eating: eat more vegetables, use fresh or frozen vegetables, eat foods that are low in salt, eat fruit for snacks and desserts, limit or avoid alcohol  (12/07/2010)     Activity: take a 30 minute walk every day, park at the far end of the parking lot  (12/07/2010)    Hypertension self-management support: Written self-care plan, Education handout, Resources for patients handout  (12/07/2010)   Hypertension self-care plan printed.   Hypertension education handout printed    Lipid self-management support: Written self-care  plan, Education handout, Resources for patients handout  (12/07/2010)   Lipid self-care plan printed.   Lipid education handout printed      Resource handout printed.  Appended Document: CHECKUP/SB. I discussed the patient with Dr. Tonny Branch and I agree with the assessment and plan as outlined above.

## 2011-03-15 ENCOUNTER — Encounter: Payer: Self-pay | Admitting: Internal Medicine

## 2011-03-18 LAB — HEMOCCULT GUIAC POC 1CARD (OFFICE): Fecal Occult Bld: POSITIVE

## 2011-05-08 ENCOUNTER — Encounter: Payer: Self-pay | Admitting: Internal Medicine

## 2011-05-29 ENCOUNTER — Other Ambulatory Visit: Payer: Self-pay | Admitting: *Deleted

## 2011-05-29 MED ORDER — CASANTHRANOL-DOCUSATE SODIUM 30-100 MG PO CAPS: 100.0000 mg | ORAL_CAPSULE | Freq: Two times a day (BID) | ORAL | Status: DC

## 2011-05-30 ENCOUNTER — Other Ambulatory Visit: Payer: Self-pay | Admitting: *Deleted

## 2011-05-30 MED ORDER — SENNOSIDES-DOCUSATE SODIUM 8.6-50 MG PO TABS: 2.0000 | ORAL_TABLET | Freq: Every day | ORAL | Status: DC | PRN

## 2011-05-30 NOTE — Telephone Encounter (Signed)
Medication has been discontinued; can u order something else? Thanks

## 2011-05-30 NOTE — Telephone Encounter (Signed)
This medication has been discontinued [facility].

## 2011-06-07 ENCOUNTER — Other Ambulatory Visit: Payer: Self-pay | Admitting: *Deleted

## 2011-06-07 MED ORDER — ATENOLOL 25 MG PO TABS: 25.0000 mg | ORAL_TABLET | Freq: Every day | ORAL | Status: DC

## 2011-06-28 ENCOUNTER — Ambulatory Visit (INDEPENDENT_AMBULATORY_CARE_PROVIDER_SITE_OTHER): Payer: Medicare Other | Admitting: Internal Medicine

## 2011-06-28 ENCOUNTER — Encounter: Payer: Self-pay | Admitting: Internal Medicine

## 2011-06-28 DIAGNOSIS — K219 Gastro-esophageal reflux disease without esophagitis: Secondary | ICD-10-CM | POA: Insufficient documentation

## 2011-06-28 DIAGNOSIS — E781 Pure hyperglyceridemia: Secondary | ICD-10-CM

## 2011-06-28 DIAGNOSIS — I10 Essential (primary) hypertension: Secondary | ICD-10-CM

## 2011-06-28 DIAGNOSIS — J309 Allergic rhinitis, unspecified: Secondary | ICD-10-CM

## 2011-06-28 DIAGNOSIS — F200 Paranoid schizophrenia: Secondary | ICD-10-CM

## 2011-06-28 DIAGNOSIS — K5900 Constipation, unspecified: Secondary | ICD-10-CM

## 2011-06-28 DIAGNOSIS — E876 Hypokalemia: Secondary | ICD-10-CM

## 2011-06-28 LAB — COMPREHENSIVE METABOLIC PANEL
ALT: 55 U/L — ABNORMAL HIGH (ref 0–53)
AST: 42 U/L — ABNORMAL HIGH (ref 0–37)
Alkaline Phosphatase: 78 U/L (ref 39–117)
Creat: 1.05 mg/dL (ref 0.50–1.35)
Total Protein: 8.5 g/dL — ABNORMAL HIGH (ref 6.0–8.3)

## 2011-06-28 LAB — LIPID PANEL
LDL Cholesterol: 87 mg/dL (ref 0–99)
Total CHOL/HDL Ratio: 5.8 Ratio
Triglycerides: 332 mg/dL — ABNORMAL HIGH (ref <150)

## 2011-06-28 MED ORDER — OMEPRAZOLE 20 MG PO CPDR: 20.0000 mg | DELAYED_RELEASE_CAPSULE | Freq: Every day | ORAL | Status: DC

## 2011-06-28 MED ORDER — CETIRIZINE HCL 10 MG PO TABS: 10.0000 mg | ORAL_TABLET | Freq: Every day | ORAL | Status: DC

## 2011-06-28 MED ORDER — TRIAMTERENE-HCTZ 37.5-25 MG PO CAPS: 1.0000 | ORAL_CAPSULE | Freq: Every day | ORAL | Status: DC

## 2011-06-28 MED ORDER — POTASSIUM CHLORIDE ER 10 MEQ PO TBCR: 10.0000 meq | EXTENDED_RELEASE_TABLET | Freq: Every day | ORAL | Status: DC

## 2011-06-28 NOTE — Patient Instructions (Addendum)
Stop the Loratidine  Start Cetirizine 10 mg tablets take 1 tablet daily.  Continue all other medications as prescribed.  Stop in the lab to have your blood drawn.  I will call if we need to follow up on the results.  Follow up in about 6 months.

## 2011-06-28 NOTE — Progress Notes (Signed)
Subjective:   Patient ID: Leonard Tucker male   DOB: July 28, 1956 55 y.o.   MRN: [id number]  HPI: [name] is a 55 y.o. man who presents to clinic today for routine follow up of his chronic medical problems including hypertriglyceridemia, HTN, GERD, chronic constipation, and hypokalemia.  He is accompanied by his mother who is his primary caretaker and the historian for him.  He does sign but is deaf and does not speak as well.  His mother states that is taking his medication and has no problems with side effects.  She is concerned because several of the OTC medications she is using including Miralax and preparation H, are expensive for them.  She also has been giving him Sennakot-S on recommendation from the pharmacist and wanted to discuss it with the doctor.  He is due for monitoring of his hypokalemia as well as his hypertriglyceridemia.    He has also been having trouble with coughing, clear nasal drainage, and itching, red eyes.  He has been on Loratadine for sometime and this usually helps but lately he has been having problems with continued symptoms.  His mom denies any fevers, chills, production with the cough, shortness of breath, or wheezing.   Past Medical History  Diagnosis Date  . Deaf-mutism   . MR (mental retardation)   . Schizophrenia, paranoid, chronic   . Chronic constipation   . Hypertriglyceridemia   . Dental caries   . Intertrigo   . Umbilical hernia   . History of iron deficiency anemia    Current Outpatient Prescriptions  Medication Sig Dispense Refill  . atenolol (TENORMIN) 25 MG tablet Take 1 tablet (25 mg total) by mouth daily.  30 tablet  6  . Casanthranol-Docusate Sodium 30-100 MG CAPS Take 100 mg by mouth 2 (two) times daily.  60 each  11  . fluphenazine (PROLIXIN) 2.5 MG/5ML elixir Inject as directed. Depo injections at behavioral health every 2 weeks       . loratadine (CLARITIN) 10 MG tablet Take 10 mg by mouth daily.        . mometasone (NASONEX) 50  MCG/ACT nasal spray Place 2 sprays into the nose daily.        Marland Kitchen omeprazole (PRILOSEC) 20 MG capsule Take 20 mg by mouth daily.        . potassium chloride (K-DUR) 10 MEQ tablet Take 10 mEq by mouth daily. Start taking 1 tablet by mouth daily       . senna-docusate (SENOKOT-S) 8.6-50 MG per tablet Take 2 tablets by mouth daily as needed for constipation.  60 tablet  1  . triamterene-hydrochlorothiazide (DYAZIDE) 37.5-25 MG per capsule Take 1 capsule by mouth daily.         No family history on file. History   Social History  . Marital Status: Single    Spouse Name: N/A    Number of Children: N/A  . Years of Education: N/A   Social History Main Topics  . Smoking status: Never Smoker   . Smokeless tobacco: Not on file  . Alcohol Use: Not on file  . Drug Use: Not on file  . Sexually Active: Not on file   Other Topics Concern  . Not on file   Social History Narrative   His mother, Leonard Tucker is his primary caretaker   Review of Systems: Constitutional: Denies fever, chills, diaphoresis, appetite change and fatigue.  HEENT: Positive for  rhinorrhea, sneezing, and redness. Denies photophobia, eye pain, , hearing  loss, ear pain, congestion, sore throat, mouth sores, trouble swallowing, neck pain, neck stiffness and tinnitus.   Respiratory: Denies SOB, DOE, cough, chest tightness,  and wheezing.   Cardiovascular: Denies chest pain, palpitations and leg swelling.  Gastrointestinal: Denies nausea, vomiting, abdominal pain, diarrhea, constipation, blood in stool and abdominal distention.  Genitourinary: Denies dysuria, urgency, frequency, hematuria, flank pain and difficulty urinating.  Musculoskeletal: Denies myalgias, back pain, joint swelling, arthralgias and gait problem.  Skin: Denies pallor, rash and wound.  Neurological: Denies dizziness, seizures, syncope, weakness, light-headedness, numbness and headaches.  Hematological: Denies adenopathy. Easy bruising, personal or family  bleeding history  Psychiatric/Behavioral: Denies suicidal ideation, mood changes, confusion, nervousness, sleep disturbance and agitation  Objective:  Physical Exam: Filed Vitals:   06/28/11 1510  BP: 113/71  Pulse: 88  Temp: 98 F (36.7 C)  TempSrc: Oral  Height: 5\' 6"  (1.676 m)  Weight: 207 lb 6.4 oz (94.076 kg)   Constitutional: Vital signs reviewed.  Patient is a pleasant middle aged man in no acute distress and cooperative with exam. He is normally interactive and does sign to his mother to answer questions.  Head: Normocephalic and atraumatic Ear: TM normal bilaterally Mouth: no erythema or exudates, MMM Eyes: PERRL, EOMI, conjunctivae normal, No scleral icterus.  Neck: Supple, Trachea midline normal ROM, No JVD, mass, thyromegaly, or carotid bruit present.  Cardiovascular: RRR, S1 normal, S2 normal, no MRG, pulses symmetric and intact bilaterally Pulmonary/Chest: CTAB, no wheezes, rales, or rhonchi Abdominal: Soft. Non-tender, non-distended, bowel sounds are normal, no masses, organomegaly, or guarding present.  GU: no CVA tenderness Musculoskeletal: No joint deformities, erythema, or stiffness, ROM full and no nontender Hematology: no cervical, inginal, or axillary adenopathy.  Neurological: A&O x3, Strenght is normal and symmetric bilaterally, cranial nerve II-XII are grossly intact, no focal motor deficit, sensory intact to light touch bilaterally.  Skin: Warm, dry and intact. No rash, cyanosis, or clubbing.  Psychiatric: Normal mood and affect. speech and behavior is normal. Judgment and thought content normal. Cognition and memory are normal.   Assessment & Plan:

## 2011-07-01 ENCOUNTER — Encounter: Payer: Self-pay | Admitting: Internal Medicine

## 2011-07-01 NOTE — Assessment & Plan Note (Signed)
He continues to struggle with constipation.  We discussed the treatment of chronic constipation with OTC medications including docusate, sennakot-S, and miralax.  Suggestions were given to save them money including using the store brands which should work as well but be significantly less expensive.

## 2011-07-01 NOTE — Assessment & Plan Note (Signed)
Potassium  Date/Time Value Range Status  06/28/2011  4:06 PM 3.3* 3.5-5.3 (mEq/L) Final  12/07/2010  9:30 PM 3.8  3.5-5.3 (mEq/L) Final  06/27/2010 10:31 PM 3.4* (3.5-5.3) (mEq/L) Final  03/20/2010  9:45 PM 3.3* (3.5-5.3) (mEq/L) Final  10/18/2009  8:31 PM 3.3* (3.5-5.3) (mEq/L) Final   His potassium has continued to be low as as it has been for sometime.  He has been taking his supplement but complains that the size is sometimes too big.  It was discussed with his mother that she could crush it and put it in apple sauce or something to make it more palpable for him.  His numbers are similar to previously so we will continue on his current prescription since it has been hit or miss that he has been taking his medications.

## 2011-07-01 NOTE — Assessment & Plan Note (Signed)
Lab Results  Component Value Date   CHOL 185 06/28/2011   CHOL 178 03/20/2010   CHOL 178 04/04/2009   Lab Results  Component Value Date   HDL 32* 06/28/2011   HDL 45 03/20/2010   HDL 42 4/0/9811   Lab Results  Component Value Date   LDLCALC 87 06/28/2011   LDLCALC 87 03/20/2010   LDLCALC 94 04/04/2009   Lab Results  Component Value Date   TRIG 332* 06/28/2011   TRIG 232* 03/20/2010   TRIG 209* 04/04/2009   Lab Results  Component Value Date   CHOLHDL 5.8 06/28/2011   CHOLHDL 4.0 Ratio 03/20/2010   CHOLHDL 4.2 Ratio 04/04/2009   No results found for this basename: LDLDIRECT   His LDL and HDL are doing well.  His triglycerides continue to be higher then we would like.  It has been discussed previously with him about treating this with either omega-3 capsules or statin therapy and since it is only a mild elevation it was decided to leave well enough alone for now.  We will continue to monitor his LDL for his risk stratification and if his triglycerides get worse we will revisit the discussion on treatment.

## 2011-07-01 NOTE — Assessment & Plan Note (Signed)
His symptoms are consistent with his known allergic rhinitis.  Since he has been on Loratadine for sometime we will try switching him to Zyrtec to try to give him relief.  They will continue to monitor this and see if the change helps.  Likely he will be able to go back to Claritin in the future if the Zyrtec stops working.

## 2011-07-01 NOTE — Assessment & Plan Note (Signed)
BP Readings from Last 3 Encounters:  06/28/11 113/71  12/07/10 121/79  06/27/10 131/81   Blood pressure today is excellent and below the goal so we will continue his current medications.

## 2011-07-01 NOTE — Assessment & Plan Note (Signed)
He is followed by Mental health and gets his biweekly Prolixin shot.  His mother states that he has been doing well so we will continue to monitor and they will inform me if his medications change.

## 2011-08-19 ENCOUNTER — Telehealth: Payer: Self-pay | Admitting: *Deleted

## 2011-08-19 NOTE — Telephone Encounter (Signed)
His level of potassium was low so 20 mEq is fine for him to take and we will follow up when he comes to his follow up.

## 2011-08-19 NOTE — Telephone Encounter (Signed)
[facility] [place] pt has been taking K-Dur 10 meq daily from Dr. Tonny Branch and Klor-Con 10 Meq from Dr. Clerance Lav daily.  Pharmacy wanted to know what pt should be on.  Pharmacy was told that pt should only be on the 10 meq ordered by Dr. Tonny Branch.  Pt's wife did  Not know who or where they saw Dr. Clerance Lav.  Pt was advised to schedule an appointment to come in to see Dr. Tonny Branch since he has been doubling his potassium.  Pt was connected to the front to schedule an appointment to come in as soon as possible.

## 2011-08-21 ENCOUNTER — Ambulatory Visit (INDEPENDENT_AMBULATORY_CARE_PROVIDER_SITE_OTHER): Payer: Medicare Other | Admitting: Internal Medicine

## 2011-08-21 ENCOUNTER — Encounter: Payer: Self-pay | Admitting: Internal Medicine

## 2011-08-21 VITALS — BP 124/82 | HR 90 | Temp 97.0°F | Wt 211.2 lb

## 2011-08-21 DIAGNOSIS — B351 Tinea unguium: Secondary | ICD-10-CM | POA: Insufficient documentation

## 2011-08-21 DIAGNOSIS — Z23 Encounter for immunization: Secondary | ICD-10-CM

## 2011-08-21 DIAGNOSIS — I10 Essential (primary) hypertension: Secondary | ICD-10-CM

## 2011-08-21 DIAGNOSIS — K5900 Constipation, unspecified: Secondary | ICD-10-CM

## 2011-08-21 DIAGNOSIS — E876 Hypokalemia: Secondary | ICD-10-CM

## 2011-08-21 LAB — BASIC METABOLIC PANEL
CO2: 30 mEq/L (ref 19–32)
Calcium: 9.8 mg/dL (ref 8.4–10.5)
Chloride: 100 mEq/L (ref 96–112)
Potassium: 3.1 mEq/L — ABNORMAL LOW (ref 3.5–5.3)

## 2011-08-21 NOTE — Progress Notes (Signed)
Subjective:   Patient ID: Leonard Tucker male   DOB: 1956/08/03 55 y.o.   MRN: [id number]  HPI: [name] is a 55 y.o. man who presents today to discuss his medications.  His mother is with him today and is his caregiver and the primary historian.  She states that he had two prescriptions for 10 mEq of K daily and she was accidentally giving him both tablets.  She states that since that happened she has noticed that his bowel movements have been more frequent and looser.  After calling the office she cut him back to only 1 tablet today.    His only other complaint was his right 5th toenail.  It has been loose for a few weeks and he states that it hurts from time to time.  Denies any lesions or ulcerations.  The nail is still intact.    Past Medical History  Diagnosis Date  . Deaf-mutism   . MR (mental retardation)   . Schizophrenia, paranoid, chronic   . Chronic constipation   . Hypertriglyceridemia   . Dental caries   . Intertrigo   . Umbilical hernia   . History of iron deficiency anemia    Current Outpatient Prescriptions  Medication Sig Dispense Refill  . atenolol (TENORMIN) 25 MG tablet Take 1 tablet (25 mg total) by mouth daily.  30 tablet  6  . Casanthranol-Docusate Sodium 30-100 MG CAPS Take 100 mg by mouth 2 (two) times daily.  60 each  11  . cetirizine (ZYRTEC) 10 MG tablet Take 1 tablet (10 mg total) by mouth daily.  30 tablet  6  . fluphenazine (PROLIXIN) 2.5 MG/5ML elixir Inject as directed. Depo injections at behavioral health every 2 weeks       . mometasone (NASONEX) 50 MCG/ACT nasal spray Place 2 sprays into the nose daily.        Marland Kitchen omeprazole (PRILOSEC) 20 MG capsule Take 1 capsule (20 mg total) by mouth daily.  30 capsule  6  . potassium chloride (K-DUR) 10 MEQ tablet Take 1 tablet (10 mEq total) by mouth daily. Start taking 1 tablet by mouth daily  30 tablet  6  . senna-docusate (SENOKOT-S) 8.6-50 MG per tablet Take 2 tablets by mouth daily as needed for  constipation.  60 tablet  1  . triamterene-hydrochlorothiazide (DYAZIDE) 37.5-25 MG per capsule Take 1 capsule by mouth daily.  30 capsule  6   No family history on file. History   Social History  . Marital Status: Single    Spouse Name: N/A    Number of Children: N/A  . Years of Education: N/A   Social History Main Topics  . Smoking status: Never Smoker   . Smokeless tobacco: None  . Alcohol Use: None  . Drug Use: None  . Sexually Active: None   Other Topics Concern  . None   Social History Narrative   His mother, Leonard Tucker is his primary caretaker   Review of Systems: Constitutional: Denies fever, chills, diaphoresis, appetite change and fatigue.  HEENT: Denies photophobia, eye pain, redness, hearing loss, ear pain, congestion, sore throat, rhinorrhea, sneezing, mouth sores, trouble swallowing, neck pain, neck stiffness and tinnitus.   Respiratory: Denies SOB, DOE, cough, chest tightness,  and wheezing.   Cardiovascular: Denies chest pain, palpitations and leg swelling.  Gastrointestinal: Denies nausea, vomiting, abdominal pain, diarrhea, constipation, blood in stool and abdominal distention.  Genitourinary: Denies dysuria, urgency, frequency, hematuria, flank pain and difficulty urinating.  Musculoskeletal: Denies  myalgias, back pain, joint swelling, arthralgias and gait problem.  Skin: Denies pallor, rash and wound.  Neurological: Denies dizziness, seizures, syncope, weakness, light-headedness, numbness and headaches.  Hematological: Denies adenopathy. Easy bruising, personal or family bleeding history  Psychiatric/Behavioral: Denies suicidal ideation, mood changes, confusion, nervousness, sleep disturbance and agitation  Objective:  Physical Exam: Filed Vitals:   08/21/11 1546  BP: 124/82  Pulse: 90  Temp: 97 F (36.1 C)  TempSrc: Oral  Weight: 211 lb 3.2 oz (95.8 kg)   Constitutional: Vital signs reviewed.  Patient is a well-nourished, mute man in no acute  distress and cooperative with exam. Alert and oriented x3.  Head: Normocephalic and atraumatic Ear: TM normal bilaterally Mouth: no erythema or exudates, MMM Eyes: PERRL, EOMI, conjunctivae normal, No scleral icterus.  Neck: Supple, Trachea midline normal ROM, No JVD, mass, thyromegaly, or carotid bruit present.  Cardiovascular: RRR, S1 normal, S2 normal, no MRG, pulses symmetric and intact bilaterally Pulmonary/Chest: CTAB, no wheezes, rales, or rhonchi Abdominal: Soft. Non-tender, non-distended, bowel sounds are normal, no masses, organomegaly, or guarding present.  GU: no CVA tenderness Musculoskeletal: No joint deformities, erythema, or stiffness, ROM full and no nontender Skin: right 5th great toe is hyperkeratotic and thickened.  It appears to be attached to a small nail bed.  Warm, dry and intact. No rash, cyanosis, or clubbing.  Psychiatric: Normal mood and affect. speech and behavior is normal. Judgment and thought content normal. Cognition and memory are normal.   Assessment & Plan:

## 2011-08-21 NOTE — Patient Instructions (Addendum)
Cut back to just 1 10 mEq tablet daily of the potassium.  You can use a band Aid to protect the toe and toe nail.  We will refer you to the foot doctor to help trim his nails.   Follow up in 2-3 months.

## 2011-08-22 NOTE — Assessment & Plan Note (Signed)
He has had some looser stools lately.  We talked about titrating the constipation medication to achieve 1-2 BM daily.

## 2011-08-22 NOTE — Assessment & Plan Note (Addendum)
Potassium  Date Value Range Status  08/21/2011 3.1* 3.5-5.3 (mEq/L) Final  06/28/2011 3.3* 3.5-5.3 (mEq/L) Final  12/07/2010 3.8  3.5-5.3 (mEq/L) Final  06/27/2010 3.4* (3.5-5.3) (mEq/L) Final  03/20/2010 3.3* (3.5-5.3) (mEq/L) Final  10/18/2009 3.3* (3.5-5.3) (mEq/L) Final   His potassium has been low for sometime even with replacement.  As far as I can tell from our records he has never been worked up for hyperaldosteronism so that may be needed.  We will start with a Mg level since that hasn't been checked in a few years and go from there.   Magnesium  Date Value Range Status  08/21/2011 2.1  1.5-2.5 (mg/dL) Final  12/04/2438 2.2  1.0-2.7 (mg/dL) Final   His magnesium level has been stable and doesn't explain the continued low potassium.  I went back through his paper records and he has had hypokalemia since he started Triamterene/HCTZ and has been on between 10-40 mEq replacement daily.  He has never had complains and if he continues to be stable I think it is okay to continue the medications as is.  If he has any problems I think switching him to a different blood pressure medication would be useful.

## 2011-08-22 NOTE — Assessment & Plan Note (Signed)
BP Readings from Last 3 Encounters:  08/21/11 124/82  06/28/11 113/71  12/07/10 121/79   Blood pressure today is well controlled.  He has been taking 2 of his KCl replacements but he has also had some looser stools recently.  We will check a Bmet today to make sure he is not high though I doubt it since he has been low consistently for sometime.    Basic Metabolic Panel:    Component Value Date/Time   NA 141 08/21/2011 1631   K 3.1* 08/21/2011 1631   CL 100 08/21/2011 1631   CO2 30 08/21/2011 1631   BUN 10 08/21/2011 1631   CREATININE 1.04 08/21/2011 1631   CREATININE 1.04 12/07/2010 2130   GLUCOSE 106* 08/21/2011 1631   CALCIUM 9.8 08/21/2011 1631   His K is actually lower then before.  He has no symptoms currently.  His blood pressure has been well controlled on Triamterene/HCTZ so I'm hesitant to change but I'm also concerned about his constant low potassium even with increased replacement.  We will discuss this at his follow up visit and likely change his medications up.

## 2011-08-22 NOTE — Assessment & Plan Note (Signed)
He has a fungal infection of his toe nails with hyperkeratosis of the nails as well. They are too thick to be trimmed in our office so we will refer him to a podiatrist to have them trimmed.

## 2011-08-28 ENCOUNTER — Telehealth: Payer: Self-pay | Admitting: *Deleted

## 2011-08-28 NOTE — Telephone Encounter (Signed)
Pt 's mother called and states pt gets loose stool when he takes potassium.  She stopped 2 days ago and pt feels better. Pt is on k-dur 10 meq.  Mother thinks he did better on last kind of k dur.   I called pharmacy and pt had been getting  klor con M26meq and last refill he got Klor-con 10 meq.  The M is a little slower release. I had them switch back to the Klor-con M10 meq.  Same directions. Is this okay with you?

## 2011-08-28 NOTE — Telephone Encounter (Signed)
Sounds perfect.  Thanks Inocencio Homes!

## 2011-10-29 ENCOUNTER — Telehealth: Payer: Self-pay | Admitting: *Deleted

## 2011-10-29 NOTE — Telephone Encounter (Signed)
Hilda Lias calls for [employer] c/o hemorrhoids and scant bleeding, appt will be made for asap per marie's request

## 2011-10-30 NOTE — Telephone Encounter (Signed)
i called Leonard Tucker back, appt thurs 11/29 at 1015, she is agreeable, appt per chilonb.

## 2011-10-30 NOTE — Telephone Encounter (Signed)
Thank you :)

## 2011-10-31 ENCOUNTER — Ambulatory Visit (INDEPENDENT_AMBULATORY_CARE_PROVIDER_SITE_OTHER): Payer: Medicare Other | Admitting: Internal Medicine

## 2011-10-31 ENCOUNTER — Telehealth: Payer: Self-pay | Admitting: *Deleted

## 2011-10-31 ENCOUNTER — Encounter: Payer: Self-pay | Admitting: Internal Medicine

## 2011-10-31 DIAGNOSIS — K649 Unspecified hemorrhoids: Secondary | ICD-10-CM

## 2011-10-31 DIAGNOSIS — K5900 Constipation, unspecified: Secondary | ICD-10-CM

## 2011-10-31 DIAGNOSIS — D500 Iron deficiency anemia secondary to blood loss (chronic): Secondary | ICD-10-CM

## 2011-10-31 MED ORDER — PSYLLIUM 48.57 % PO POWD: 15.0000 g | Freq: Every day | ORAL | Status: DC

## 2011-10-31 MED ORDER — HYDROCORTISONE ACETATE 25 MG RE SUPP: 25.0000 mg | Freq: Two times a day (BID) | RECTAL | Status: AC

## 2011-10-31 NOTE — Telephone Encounter (Signed)
Call from [facility] needing clarification on Rx. They do not have Psyllium and wanted to know if you wanted metamucil?  The directions would be different, it comes 3,350 NF Please clarify..  Pharmacy # [phone]

## 2011-10-31 NOTE — Patient Instructions (Signed)

## 2011-10-31 NOTE — Telephone Encounter (Signed)
Metamucil is the brand name for psyllium. It should be ok to have metamucil instead of psyllium but I do not want him to take 30 gm a day and only 15 gm a day as ordered in the original order.  Please let me know if further clarification is needed.  Thank you, Weber Monnier

## 2011-10-31 NOTE — Progress Notes (Signed)
  Subjective:    Patient ID: Leonard Tucker, male    DOB: [birth year], 55 y.o.   MRN: [id number]  HPI  Leonard Tucker is a 55 year old man with PMH most significant for mental retardation and deaf mutism.  He was brough in by his mother with chief complaints of blood noted after defecation since last 2 days. Patient has had history of hemorrhoids and constipation in the past but is on confusing regimen of meds. She is currently not giving him anything for constipation or loose stools.  She is concerned about his weight gain and unhealthy eating habits.  No other complaints.  Review of Systems  Constitutional: Negative for fever, activity change and appetite change.  HENT: Negative for sore throat.   Respiratory: Negative for cough and shortness of breath.   Cardiovascular: Negative for chest pain and leg swelling.  Gastrointestinal: Negative for nausea, abdominal pain, diarrhea, constipation and abdominal distention.  Genitourinary: Negative for frequency, hematuria and difficulty urinating.  Neurological: Negative for dizziness and headaches.  Psychiatric/Behavioral: Negative for suicidal ideas and behavioral problems.       Objective:   Physical Exam  Constitutional: He is oriented to person, place, and time. He appears well-developed and well-nourished.  HENT:  Head: Normocephalic and atraumatic.  Eyes: Conjunctivae and EOM are normal. Pupils are equal, round, and reactive to light. No scleral icterus.  Neck: Normal range of motion. Neck supple. No JVD present. No thyromegaly present.  Cardiovascular: Normal rate, regular rhythm, normal heart sounds and intact distal pulses.  Exam reveals no gallop and no friction rub.   No murmur heard. Pulmonary/Chest: Effort normal and breath sounds normal. No respiratory distress. He has no wheezes. He has no rales.  Abdominal: Soft. Bowel sounds are normal. He exhibits no distension and no mass. There is no tenderness. There is no rebound and no  guarding.  Musculoskeletal: Normal range of motion. He exhibits no edema and no tenderness.  Lymphadenopathy:    He has no cervical adenopathy.  Neurological: He is alert and oriented to person, place, and time.  Psychiatric: He has a normal mood and affect. His behavior is normal.          Assessment & Plan:

## 2011-11-01 ENCOUNTER — Other Ambulatory Visit: Payer: Self-pay | Admitting: Internal Medicine

## 2011-11-01 DIAGNOSIS — D500 Iron deficiency anemia secondary to blood loss (chronic): Secondary | ICD-10-CM

## 2011-11-01 LAB — CBC WITH DIFFERENTIAL/PLATELET
Basophils Relative: 1 % (ref 0–1)
Eosinophils Absolute: 0.1 10*3/uL (ref 0.0–0.7)
HCT: 38.7 % — ABNORMAL LOW (ref 39.0–52.0)
Hemoglobin: 11.7 g/dL — ABNORMAL LOW (ref 13.0–17.0)
Lymphocytes Relative: 24 % (ref 12–46)
Lymphs Abs: 1.7 10*3/uL (ref 0.7–4.0)
MCH: 21.9 pg — ABNORMAL LOW (ref 26.0–34.0)
Monocytes Relative: 16 % — ABNORMAL HIGH (ref 3–12)
Neutrophils Relative %: 58 % (ref 43–77)
Platelets: 276 10*3/uL (ref 150–400)
RDW: 17.3 % — ABNORMAL HIGH (ref 11.5–15.5)

## 2011-11-01 MED ORDER — FERROUS SULFATE 325 (65 FE) MG PO TABS: 325.0000 mg | ORAL_TABLET | Freq: Three times a day (TID) | ORAL | Status: DC

## 2011-11-01 NOTE — Assessment & Plan Note (Signed)
Hemmorhhoids are the most likely cause of his anemia.

## 2011-11-01 NOTE — Telephone Encounter (Signed)
Pharmacy informed of new order per Dr Eben Burow

## 2011-11-04 NOTE — Assessment & Plan Note (Signed)
I will check CBC today to check for anemia sec to blood loss.

## 2011-11-04 NOTE — Assessment & Plan Note (Signed)
Start fiber(metamucil) everyday and will also provide anusol cream for use for next 6 days to reduce inflammation.

## 2011-11-08 ENCOUNTER — Ambulatory Visit: Payer: Medicare Other | Admitting: Internal Medicine

## 2011-12-12 ENCOUNTER — Other Ambulatory Visit: Payer: Self-pay | Admitting: *Deleted

## 2011-12-12 ENCOUNTER — Other Ambulatory Visit: Payer: Self-pay | Admitting: Internal Medicine

## 2011-12-12 DIAGNOSIS — J309 Allergic rhinitis, unspecified: Secondary | ICD-10-CM

## 2011-12-12 MED ORDER — MOMETASONE FUROATE 50 MCG/ACT NA SUSP: 2.0000 | Freq: Every day | NASAL | Status: DC

## 2011-12-30 ENCOUNTER — Other Ambulatory Visit: Payer: Self-pay | Admitting: *Deleted

## 2011-12-30 DIAGNOSIS — I10 Essential (primary) hypertension: Secondary | ICD-10-CM

## 2011-12-31 MED ORDER — TRIAMTERENE-HCTZ 37.5-25 MG PO CAPS: 1.0000 | ORAL_CAPSULE | Freq: Every day | ORAL | Status: DC

## 2011-12-31 NOTE — Telephone Encounter (Signed)
Please sch with Dr Tonny Branch in next couple of months.

## 2012-01-10 ENCOUNTER — Other Ambulatory Visit: Payer: Self-pay | Admitting: *Deleted

## 2012-01-10 MED ORDER — ATENOLOL 25 MG PO TABS: 25.0000 mg | ORAL_TABLET | Freq: Every day | ORAL | Status: DC

## 2012-01-12 ENCOUNTER — Emergency Department (INDEPENDENT_AMBULATORY_CARE_PROVIDER_SITE_OTHER)
Admission: EM | Admit: 2012-01-12 | Discharge: 2012-01-12 | Disposition: A | Discharge status: Home / Self Care | Payer: Medicare Other | Source: Home / Self Care

## 2012-01-12 ENCOUNTER — Encounter (HOSPITAL_COMMUNITY): Payer: Self-pay | Admitting: *Deleted

## 2012-01-12 DIAGNOSIS — J309 Allergic rhinitis, unspecified: Secondary | ICD-10-CM

## 2012-01-12 DIAGNOSIS — I10 Essential (primary) hypertension: Secondary | ICD-10-CM | POA: Diagnosis not present

## 2012-01-12 DIAGNOSIS — K649 Unspecified hemorrhoids: Secondary | ICD-10-CM | POA: Diagnosis not present

## 2012-01-12 DIAGNOSIS — J302 Other seasonal allergic rhinitis: Secondary | ICD-10-CM

## 2012-01-12 MED ORDER — HYDROCORTISONE ACETATE 25 MG RE SUPP: 25.0000 mg | Freq: Two times a day (BID) | RECTAL | Status: AC

## 2012-01-12 MED ORDER — HYDROCORTISONE 1 % EX CREA: TOPICAL_CREAM | CUTANEOUS | Status: DC

## 2012-01-12 MED ORDER — ATENOLOL 25 MG PO TABS: 25.0000 mg | ORAL_TABLET | Freq: Every day | ORAL | Status: DC

## 2012-01-12 MED ORDER — MOMETASONE FUROATE 50 MCG/ACT NA SUSP: 2.0000 | Freq: Every day | NASAL | Status: DC

## 2012-01-12 NOTE — ED Provider Notes (Signed)
History     CSN: [id number]  Arrival date & time 01/12/12  1404   None     Chief Complaint  Patient presents with  . Constipation    (Consider location/radiation/quality/duration/timing/severity/associated sxs/prior treatment) HPI Comments: Mother (caregiver) with pt, explains that pt is out of his atenolol (has been taking for at least a year), nasonex, preparation H and anusol suppositories for hemorrhoids.  Pt has appt with pcp next month, mother requests refills to last until that appt.  Mother has noticed small amount red blood in stool recently, and when this happened before, the preparation H and anusol HC took care of it.  No black, tarry stools (although stool is dark from Iron pt takes).  Small amounts of blood only when wiping.  No problems with bp or nasal allergy sx, just out of meds.   The history is provided by a parent. The history is limited by a developmental delay.    Past Medical History  Diagnosis Date  . Deaf-mutism   . MR (mental retardation)   . Schizophrenia, paranoid, chronic   . Chronic constipation   . Hypertriglyceridemia   . Dental caries   . Intertrigo   . Umbilical hernia   . History of iron deficiency anemia     History reviewed. No pertinent past surgical history.  History reviewed. No pertinent family history.  History  Substance Use Topics  . Smoking status: Never Smoker   . Smokeless tobacco: Not on file  . Alcohol Use: Not on file      Review of Systems  Unable to perform ROS: Other    Allergies  Review of patient's allergies indicates no known allergies.  Home Medications   Current Outpatient Rx  Name Route Sig Dispense Refill  . ATENOLOL 25 MG PO TABS Oral Take 1 tablet (25 mg total) by mouth daily. 30 tablet 6  . FLUPHENAZINE HCL 2.5 MG/5ML PO ELIX Injection Inject as directed. Depo injections at behavioral health every 2 weeks     . PSYLLIUM 48.57 % PO POWD Oral Take 15 g by mouth daily. 1254 g 3  . ATENOLOL 25 MG  PO TABS Oral Take 1 tablet (25 mg total) by mouth daily. 30 tablet 0  . CETIRIZINE HCL 10 MG PO TABS Oral Take 1 tablet (10 mg total) by mouth daily. 30 tablet 6  . FERROUS SULFATE 325 (65 FE) MG PO TABS Oral Take 1 tablet (325 mg total) by mouth 3 (three) times daily with meals. 90 tablet 3  . HYDROCORTISONE ACETATE 25 MG RE SUPP  PLACE 1 SUPPOSITORY RECTALLY TWICE A DAY 12 suppository 0  . HYDROCORTISONE ACETATE 25 MG RE SUPP Rectal Place 1 suppository (25 mg total) rectally 2 (two) times daily. 12 suppository 0  . HYDROCORTISONE 1 % EX CREA  Apply to affected area 2 times daily 15 g 0  . MOMETASONE FUROATE 50 MCG/ACT NA SUSP Nasal Place 2 sprays into the nose daily. 17 g 3  . MOMETASONE FUROATE 50 MCG/ACT NA SUSP Nasal Place 2 sprays into the nose daily. 17 g 0  . OMEPRAZOLE 20 MG PO CPDR Oral Take 1 capsule (20 mg total) by mouth daily. 30 capsule 6  . POTASSIUM CHLORIDE ER 10 MEQ PO TBCR Oral Take 1 tablet (10 mEq total) by mouth daily. Start taking 1 tablet by mouth daily 30 tablet 6  . TRIAMTERENE-HCTZ 37.5-25 MG PO CAPS Oral Take 1 each (1 capsule total) by mouth daily. 30 capsule 5  BP 124/85  Pulse 71  Temp(Src) 97.5 F (36.4 C) (Oral)  Resp 24  SpO2 99%  Physical Exam  Constitutional: He appears well-developed and well-nourished. No distress.  Cardiovascular: Normal rate and normal heart sounds.   Pulmonary/Chest: Effort normal and breath sounds normal.  Genitourinary: Rectal exam shows external hemorrhoid.       Pt with several large, soft hemorrhoids externally; internal exam deferred as these are same mild sx as previous and pt with developmental and communication challenges.  Is seeing pcp in less than 1 month for f/u.      ED Course  Procedures (including critical care time)  Labs Reviewed - No data to display No results found.   1. Hemorrhoid   2. Hypertension   3. Seasonal allergic rhinitis    Mother brought pt here for medication refills; mother does not  have any concerns about pt's condition.  Pt eating and behaving normally, no change in activity level or usual demeanor.  Deferred internal rectal exam based on sx and stable pt.     MDM          Cathlyn Parsons, NP 01/12/12 1557

## 2012-01-12 NOTE — ED Notes (Addendum)
Per mother pt needs refill for atenolol 25mg  once a day has appt on 3/8 with primary care at out pt clinic - - mother also concerned due to pt with cough at night - per mother pt straining when having bowel movements - history of same - had prescription for suppository which helped with his constipation per mother would like prescription for atenolol/nasonex/suppository and preparation H

## 2012-01-13 NOTE — ED Provider Notes (Signed)
Medical screening examination/treatment/procedure(s) were performed by non-physician practitioner and as supervising physician I was immediately available for consultation/collaboration.   KINDL,Lissandro DOUGLAS MD.    Christerpher Douglas Kindl, MD 01/13/12 1547 

## 2012-02-02 ENCOUNTER — Other Ambulatory Visit: Payer: Self-pay | Admitting: Internal Medicine

## 2012-02-07 ENCOUNTER — Ambulatory Visit (INDEPENDENT_AMBULATORY_CARE_PROVIDER_SITE_OTHER): Payer: Medicare Other | Admitting: Internal Medicine

## 2012-02-07 VITALS — BP 118/80 | HR 74 | Temp 98.1°F | Resp 20 | Ht 68.0 in | Wt 209.4 lb

## 2012-02-07 DIAGNOSIS — K5900 Constipation, unspecified: Secondary | ICD-10-CM

## 2012-02-07 DIAGNOSIS — E876 Hypokalemia: Secondary | ICD-10-CM

## 2012-02-07 DIAGNOSIS — I10 Essential (primary) hypertension: Secondary | ICD-10-CM

## 2012-02-07 DIAGNOSIS — J069 Acute upper respiratory infection, unspecified: Secondary | ICD-10-CM | POA: Diagnosis not present

## 2012-02-07 MED ORDER — POTASSIUM CHLORIDE ER 10 MEQ PO TBCR: 10.0000 meq | EXTENDED_RELEASE_TABLET | Freq: Every day | ORAL | Status: DC

## 2012-02-07 NOTE — Patient Instructions (Signed)
1.  Continue your medications as prescribed.    2.  Follow up in 6 months to see how things are going.

## 2012-02-07 NOTE — Progress Notes (Signed)
Subjective:   Patient ID: Leonard Tucker male   DOB: 09-27-1956 56 y.o.   MRN: [id number]  HPI: Leonard Tucker is a 56 y.o. man who presents to clinic today for follow up on his chronic medical conditions including hypertension, constipation, and hypokalemia.   His mother is present for the visit and states that he is taking all of his medications as prescribed.    He has had URI symptoms with nasal congestion, cough, post nasal drainage, and chest congestion.  They have had this going around all the house for the last 2 weeks.  His mom states that they have been doing Nasonex, chloraseptic spray, and cough drops and he seems to be clearing up.  They deny fevers, chills, nausea, vomiting, or diarrhea.    He has been taking his medications as prescribed and has no problems with orthostasis, headache, or blurry vision.    He has been having regular bowel movements on his current regimen of fiber, and colace.  He has also been eating prunes which seem to have helped a lot as well.  He has had no more episodes of rectal bleeding.    Past Medical History  Diagnosis Date  . Deaf-mutism   . MR (mental retardation)   . Schizophrenia, paranoid, chronic   . Chronic constipation   . Hypertriglyceridemia   . Dental caries   . Intertrigo   . Umbilical hernia   . History of iron deficiency anemia    Current Outpatient Prescriptions  Medication Sig Dispense Refill  . atenolol (TENORMIN) 25 MG tablet Take 1 tablet (25 mg total) by mouth daily.  30 tablet  6  . cetirizine (ZYRTEC) 10 MG tablet TAKE 1 TABLET BY MOUTH EVERY DAY  30 tablet  6  . ferrous sulfate (FERROUSUL) 325 (65 FE) MG tablet Take 1 tablet (325 mg total) by mouth 3 (three) times daily with meals.  90 tablet  3  . fluphenazine (PROLIXIN) 2.5 MG/5ML elixir Inject as directed. Depo injections at behavioral health every 2 weeks       . hydrocortisone (ANUSOL-HC) 25 MG suppository PLACE 1 SUPPOSITORY RECTALLY TWICE A DAY  12 suppository  0    . hydrocortisone cream 1 % Apply to affected area 2 times daily  15 g  0  . mometasone (NASONEX) 50 MCG/ACT nasal spray Place 2 sprays into the nose daily.  17 g  3  . mometasone (NASONEX) 50 MCG/ACT nasal spray Place 2 sprays into the nose daily.  17 g  0  . omeprazole (PRILOSEC) 20 MG capsule Take 1 capsule (20 mg total) by mouth daily.  30 capsule  6  . potassium chloride (K-DUR) 10 MEQ tablet Take 1 tablet (10 mEq total) by mouth daily. Start taking 1 tablet by mouth daily  30 tablet  6  . Psyllium (EQ FIBER THERAPY) 48.57 % POWD Take 15 g by mouth daily.  1254 g  3  . triamterene-hydrochlorothiazide (DYAZIDE) 37.5-25 MG per capsule Take 1 each (1 capsule total) by mouth daily.  30 capsule  5   No family history on file. History   Social History  . Marital Status: Single    Spouse Name: N/A    Number of Children: N/A  . Years of Education: N/A   Social History Main Topics  . Smoking status: Never Smoker   . Smokeless tobacco: Not on file  . Alcohol Use: Not on file  . Drug Use: Not on file  . Sexually Active:  Not on file   Other Topics Concern  . Not on file   Social History Narrative   His mother, Leonard Tucker is his primary caretaker   Review of Systems: Constitutional: Denies fever, chills, diaphoresis, appetite change and fatigue.  HEENT: Denies photophobia, eye pain, redness, hearing loss, ear pain, congestion, sore throat, rhinorrhea, sneezing, mouth sores, trouble swallowing, neck pain, neck stiffness and tinnitus.   Respiratory: Denies SOB, DOE, cough, chest tightness,  and wheezing.   Cardiovascular: Denies chest pain, palpitations and leg swelling.  Gastrointestinal: Denies nausea, vomiting, abdominal pain, diarrhea, constipation, blood in stool and abdominal distention.  Genitourinary: Denies dysuria, urgency, frequency, hematuria, flank pain and difficulty urinating.  Musculoskeletal: Denies myalgias, back pain, joint swelling, arthralgias and gait problem.   Skin: Denies pallor, rash and wound.  Neurological: Denies dizziness, seizures, syncope, weakness, light-headedness, numbness and headaches.  Hematological: Denies adenopathy. Easy bruising, personal or family bleeding history  Psychiatric/Behavioral: Denies suicidal ideation, mood changes, confusion, nervousness, sleep disturbance and agitation  Objective:  Physical Exam: Filed Vitals:   02/07/12 1341  BP: 118/80  Pulse: 74  Temp: 98.1 F (36.7 C)  TempSrc: Oral  Resp: 20  Height: 5\' 8"  (1.727 m)  Weight: 209 lb 6.4 oz (94.983 kg)   Constitutional: Vital signs reviewed.  Patient is a well-developed and well-nourished deaf man in no acute distress and cooperative with exam. Alert and oriented x3.  Head: Normocephalic and atraumatic Ear: TM normal bilaterally Mouth: no erythema or exudates, MMM Eyes: PERRL, EOMI, conjunctivae normal, No scleral icterus.  Neck: Supple, Trachea midline normal ROM, No JVD, mass, thyromegaly, or carotid bruit present.  Cardiovascular: RRR, S1 normal, S2 normal, no MRG, pulses symmetric and intact bilaterally Pulmonary/Chest: CTAB, no wheezes, rales, or rhonchi Abdominal: Soft. Non-tender, non-distended, bowel sounds are normal, no masses, organomegaly, or guarding present.  GU: no CVA tenderness Musculoskeletal: No joint deformities, erythema, or stiffness, ROM full and no nontender Hematology: no cervical, inginal, or axillary adenopathy.  Neurological: A&O x3, Strength is normal and symmetric bilaterally, cranial nerve II-XII are grossly intact, no focal motor deficit, sensory intact to light touch bilaterally.  Skin: Warm, dry and intact. No rash, cyanosis, or clubbing.  Psychiatric: Normal mood and affect. Speech is absent and behavior is normal. Judgment and thought content normal. Cognition is unchanged from previous and memory are normal.   Assessment & Plan:

## 2012-03-20 ENCOUNTER — Other Ambulatory Visit: Payer: Self-pay | Admitting: Internal Medicine

## 2012-04-17 ENCOUNTER — Other Ambulatory Visit: Payer: Self-pay | Admitting: Internal Medicine

## 2012-04-20 NOTE — Assessment & Plan Note (Signed)
Seems to be resolving on their current therapy.  Will continue and also suggested adding saline gargles for sore throat pain.

## 2012-04-20 NOTE — Assessment & Plan Note (Signed)
He continues to suffer from constipation but has a good regimen in place currently.  We will continue to support them as needed.

## 2012-04-20 NOTE — Assessment & Plan Note (Signed)
Lab Results  Component Value Date   NA 141 08/21/2011   K 3.1* 08/21/2011   CL 100 08/21/2011   CO2 30 08/21/2011   BUN 10 08/21/2011   CREATININE 1.04 08/21/2011   CREATININE 1.04 12/07/2010    BP Readings from Last 3 Encounters:  02/07/12 118/80  01/12/12 124/85  10/31/11 121/81    Assessment: Hypertension control:  controlled  Progress toward goals:  at goal Barriers to meeting goals:  no barriers identified  Plan: Hypertension treatment:  continue current medications

## 2012-04-20 NOTE — Assessment & Plan Note (Signed)
Basic Metabolic Panel:    Component Value Date/Time   NA 141 08/21/2011 1631   K 3.1* 08/21/2011 1631   CL 100 08/21/2011 1631   CO2 30 08/21/2011 1631   BUN 10 08/21/2011 1631   CREATININE 1.04 08/21/2011 1631   CREATININE 1.04 12/07/2010 2130   GLUCOSE 106* 08/21/2011 1631   CALCIUM 9.8 08/21/2011 1631   His K is about his baseline and he has been asymptomatic.  He is taking his supplementation as directed.

## 2012-04-27 ENCOUNTER — Encounter (HOSPITAL_COMMUNITY): Payer: Self-pay

## 2012-04-27 ENCOUNTER — Emergency Department (INDEPENDENT_AMBULATORY_CARE_PROVIDER_SITE_OTHER)
Admission: EM | Admit: 2012-04-27 | Discharge: 2012-04-27 | Disposition: A | Discharge status: Home Health | Payer: Medicare Other | Source: Home / Self Care | Attending: Family Medicine | Admitting: Family Medicine

## 2012-04-27 DIAGNOSIS — K644 Residual hemorrhoidal skin tags: Secondary | ICD-10-CM

## 2012-04-27 MED ORDER — HYDROCORTISONE ACETATE 25 MG RE SUPP: 25.0000 mg | Freq: Two times a day (BID) | RECTAL | Status: DC | PRN

## 2012-04-27 NOTE — Discharge Instructions (Signed)
I do not feel mass or tumors rectal exam. There is no blood on exam today. But I was able to see that there is an erosion in one of the external hemorrhoids. As it is no longer bleeding I recommend that you continue to use stool softener as previously indicated and plain glycerin suppositories if straining during the next week. Use Anusol suppositories only if pain or swelling. Followup with your primary care provider if persistent symptoms or can call GI specialist number provided above as needed.

## 2012-04-27 NOTE — ED Notes (Signed)
Mother c/o hemorrhoids.  Mother states pt has HX of same.  Mother states pt has had blood in toilet bowl and on tissue when cleaning self.

## 2012-05-03 NOTE — ED Provider Notes (Signed)
History     CSN: [id number]  Arrival date & time 04/27/12  1509   First MD Initiated Contact with Patient 04/27/12 1648      Chief Complaint  Patient presents with  . Hemorrhoids    (Consider location/radiation/quality/duration/timing/severity/associated sxs/prior treatment) HPI Comments: 70 y/ male with h/o MR, chronic constipation and external hemorrhoids. Comes with his mother concerned about blood with defecation yesterday and today in am.( of blood in stools and in tissue paper when cleaning after defecation) has been constipated in prior days but was able to defecate yesterday and today (inicially hard stoold yesterday followed by soft stools today). Taking Miralax in last 3 days. Patient has not complained of pain and activity level remains as usual. Mother ran out rectal suppositories and will prefer the ones with applicator as it is easier for her. No diarrhea or vomiting, appetite is good as usual.    Past Medical History  Diagnosis Date  . Deaf-mutism   . MR (mental retardation)   . Schizophrenia, paranoid, chronic   . Chronic constipation   . Hypertriglyceridemia   . Dental caries   . Intertrigo   . Umbilical hernia   . History of iron deficiency anemia     History reviewed. No pertinent past surgical history.  No family history on file.  History  Substance Use Topics  . Smoking status: Never Smoker   . Smokeless tobacco: Not on file  . Alcohol Use: Not on file      Review of Systems  Constitutional: Negative for fever, activity change, appetite change and unexpected weight change.  Gastrointestinal: Positive for constipation and blood in stool. Negative for vomiting, abdominal pain, diarrhea and rectal pain.  Neurological: Negative for dizziness and headaches.  Psychiatric/Behavioral: Negative for agitation.  All other systems reviewed and are negative.    Allergies  Review of patient's allergies indicates no known allergies.  Home Medications    Current Outpatient Rx  Name Route Sig Dispense Refill  . ATENOLOL 25 MG PO TABS Oral Take 1 tablet (25 mg total) by mouth daily. 30 tablet 6  . CETIRIZINE HCL 10 MG PO TABS  TAKE 1 TABLET BY MOUTH EVERY DAY 30 tablet 6    PATIENT HAS AN APPT. ON MARCH 8TH. BUT HE IS OUT O ...  . FERROUS SULFATE 325 (65 FE) MG PO TABS  TAKE 1 TABLET (325 MG TOTAL) BY MOUTH 3 (THREE) TIMES DAILY WITH MEALS. 90 tablet 3  . FLUPHENAZINE HCL 2.5 MG/5ML PO ELIX Injection Inject as directed. Depo injections at behavioral health every 2 weeks     . HYDROCORTISONE ACETATE 25 MG RE SUPP Rectal Place 1 suppository (25 mg total) rectally 2 (two) times daily as needed for hemorrhoids. 12 suppository 0    Dispense with applicator to be applied by care tak ...  . MOMETASONE FUROATE 50 MCG/ACT NA SUSP Nasal Place 2 sprays into the nose daily. 17 g 3  . OMEPRAZOLE 20 MG PO CPDR Oral Take 1 capsule (20 mg total) by mouth daily. 30 capsule 6  . POTASSIUM CHLORIDE ER 10 MEQ PO TBCR Oral Take 1 tablet (10 mEq total) by mouth daily. Start taking 1 tablet by mouth daily 30 tablet 6  . PSYLLIUM 48.57 % PO POWD Oral Take 15 g by mouth daily. 1254 g 3  . TRIAMTERENE-HCTZ 37.5-25 MG PO CAPS Oral Take 1 each (1 capsule total) by mouth daily. 30 capsule 5    BP 134/85  Pulse 82  Temp(Src)  99.1 F (37.3 C) (Oral)  Resp 20  SpO2 97%  Physical Exam  Nursing note and vitals reviewed. Constitutional: He is oriented to person, place, and time. He appears well-developed and well-nourished. No distress.  HENT:  Head: Normocephalic and atraumatic.  Mouth/Throat: Oropharynx is clear and moist.  Eyes: Conjunctivae are normal. No scleral icterus.  Neck: No thyromegaly present.  Cardiovascular: Normal heart sounds.   Pulmonary/Chest: Breath sounds normal.  Abdominal: Soft. Bowel sounds are normal. He exhibits no distension. There is no tenderness.       Obese abdomen  Genitourinary: Prostate normal. Rectal exam shows external  hemorrhoid. Rectal exam shows no fissure, no mass, no tenderness and anal tone normal. Guaiac negative stool.     Neurological: He is alert and oriented to person, place, and time.  Skin: No rash noted.    ED Course  Procedures (including critical care time)  Labs Reviewed - No data to display No results found.   1. Hemorrhoids, external       MDM  Refilled Anusol suppositories but asked mother to hold steroids suppository for now and only use glycerine suppository for mucosal lubrication (paper prescription provided). Only use Anusol if tenderness, redness or swelling. Continue miralax daily prn.        Sharin Grave, MD 05/03/12 [phone]

## 2012-05-05 DIAGNOSIS — F29 Unspecified psychosis not due to a substance or known physiological condition: Secondary | ICD-10-CM | POA: Diagnosis not present

## 2012-07-03 ENCOUNTER — Other Ambulatory Visit: Payer: Self-pay | Admitting: Internal Medicine

## 2012-07-21 ENCOUNTER — Other Ambulatory Visit: Payer: Self-pay | Admitting: Internal Medicine

## 2012-07-21 DIAGNOSIS — I10 Essential (primary) hypertension: Secondary | ICD-10-CM

## 2012-07-21 MED ORDER — TRIAMTERENE-HCTZ 37.5-25 MG PO CAPS: 1.0000 | ORAL_CAPSULE | Freq: Every day | ORAL | Status: DC

## 2012-08-10 ENCOUNTER — Other Ambulatory Visit: Payer: Self-pay | Admitting: Internal Medicine

## 2012-08-14 ENCOUNTER — Encounter: Payer: Self-pay | Admitting: Internal Medicine

## 2012-08-14 ENCOUNTER — Ambulatory Visit (INDEPENDENT_AMBULATORY_CARE_PROVIDER_SITE_OTHER): Payer: Medicare Other | Admitting: Internal Medicine

## 2012-08-14 VITALS — BP 107/70 | HR 68 | Temp 97.2°F | Ht 68.0 in | Wt 200.6 lb

## 2012-08-14 DIAGNOSIS — F200 Paranoid schizophrenia: Secondary | ICD-10-CM

## 2012-08-14 DIAGNOSIS — E781 Pure hyperglyceridemia: Secondary | ICD-10-CM

## 2012-08-14 DIAGNOSIS — E876 Hypokalemia: Secondary | ICD-10-CM

## 2012-08-14 DIAGNOSIS — I10 Essential (primary) hypertension: Secondary | ICD-10-CM | POA: Diagnosis not present

## 2012-08-14 DIAGNOSIS — Z23 Encounter for immunization: Secondary | ICD-10-CM

## 2012-08-14 DIAGNOSIS — F79 Unspecified intellectual disabilities: Secondary | ICD-10-CM

## 2012-08-14 DIAGNOSIS — J309 Allergic rhinitis, unspecified: Secondary | ICD-10-CM

## 2012-08-14 MED ORDER — POTASSIUM CHLORIDE ER 10 MEQ PO TBCR: 10.0000 meq | EXTENDED_RELEASE_TABLET | Freq: Every day | ORAL | Status: DC

## 2012-08-14 MED ORDER — MOMETASONE FUROATE 50 MCG/ACT NA SUSP: 2.0000 | Freq: Every day | NASAL | Status: DC

## 2012-08-14 MED ORDER — GUAIFENESIN-CODEINE 100-10 MG/5ML PO SYRP: 5.0000 mL | ORAL_SOLUTION | Freq: Three times a day (TID) | ORAL | Status: AC | PRN

## 2012-08-14 MED ORDER — CETIRIZINE HCL 10 MG PO TABS: 10.0000 mg | ORAL_TABLET | Freq: Every day | ORAL | Status: DC

## 2012-08-14 NOTE — Progress Notes (Signed)
Subjective:   Patient ID: Leonard Tucker male   DOB: [birth year] 56 y.o.   MRN: [id number]  HPI: [name] is a 56 y.o. man who presents to clinic today for follow up on his chronic medical conditions including paranoid schizophrenia, mental retardation, hypokalemia, hypertriglyceridemia, hypertension, and allergic rhinitis.  See Problem focused Assessment and Plan for full details of his chronic medical conditions.   Past Medical History  Diagnosis Date  . Deaf-mutism   . MR (mental retardation)   . Schizophrenia, paranoid, chronic   . Chronic constipation   . Hypertriglyceridemia   . Dental caries   . Intertrigo   . Umbilical hernia   . History of iron deficiency anemia    Current Outpatient Prescriptions  Medication Sig Dispense Refill  . atenolol (TENORMIN) 25 MG tablet Take 1 tablet (25 mg total) by mouth daily.  30 tablet  6  . cetirizine (ZYRTEC) 10 MG tablet TAKE 1 TABLET BY MOUTH EVERY DAY  30 tablet  6  . ferrous sulfate 325 (65 FE) MG tablet TAKE 1 TABLET (325 MG TOTAL) BY MOUTH 3 (THREE) TIMES DAILY WITH MEALS.  90 tablet  3  . fluphenazine (PROLIXIN) 2.5 MG/5ML elixir Inject as directed. Depo injections at behavioral health every 2 weeks       . hydrocortisone (ANUSOL-HC) 25 MG suppository Place 1 suppository (25 mg total) rectally 2 (two) times daily as needed for hemorrhoids.  12 suppository  0  . mometasone (NASONEX) 50 MCG/ACT nasal spray Place 2 sprays into the nose daily.  17 g  3  . omeprazole (PRILOSEC) 20 MG capsule TAKE ONE CAPSULE BY MOUTH EVERY DAY  30 capsule  5  . potassium chloride (K-DUR) 10 MEQ tablet Take 1 tablet (10 mEq total) by mouth daily. Start taking 1 tablet by mouth daily  30 tablet  6  . Psyllium (EQ FIBER THERAPY) 48.57 % POWD Take 15 g by mouth daily.  1254 g  3  . triamterene-hydrochlorothiazide (DYAZIDE) 37.5-25 MG per capsule Take 1 each (1 capsule total) by mouth daily.  30 capsule  5   No family history on file. History   Social  History  . Marital Status: Single    Spouse Name: N/A    Number of Children: N/A  . Years of Education: N/A   Social History Main Topics  . Smoking status: Never Smoker   . Smokeless tobacco: None  . Alcohol Use: None  . Drug Use: None  . Sexually Active: None   Other Topics Concern  . None   Social History Narrative   His mother, Whitney Bingaman is his primary caretaker   Review of Systems: A full 12 system ROS is negative except as noted in the HPI and A&P.   Objective:  Physical Exam: Filed Vitals:   08/14/12 1408  BP: 107/70  Pulse: 68  Temp: 97.2 F (36.2 C)  TempSrc: Oral  Height: 5\' 8"  (1.727 m)  Weight: 200 lb 9.6 oz (90.992 kg)  SpO2: 99%   Constitutional: Vital signs reviewed.  Patient is a well-developed and well-nourished mute and deaf man in no acute distress and cooperative with exam. Alert and oriented x3. He is jovial as always today.  Head: Normocephalic and atraumatic Ear: TM normal bilaterally Nose: boggy turbinates and clear drainage.   Mouth: no erythema or exudates, MMM Eyes: PERRL, EOMI, conjunctivae injected.  No scleral icterus.  Neck: Supple, Trachea midline normal ROM, No JVD, mass, thyromegaly, or carotid bruit present.  Cardiovascular: RRR, S1 normal, S2 normal, no MRG, pulses symmetric and intact bilaterally Pulmonary/Chest: CTAB, no wheezes, rales, or rhonchi Abdominal: Soft. Non-tender, non-distended, bowel sounds are normal, no masses, organomegaly, or guarding present.  GU: no CVA tenderness Musculoskeletal: No joint deformities, erythema, or stiffness, ROM full and no nontender Hematology: no cervical, inginal, or axillary adenopathy.  Neurological: A&O x3, Strength is normal and symmetric bilaterally, cranial nerve II-XII are grossly intact, no focal motor deficit, sensory intact to light touch bilaterally.  Skin: Warm, dry and intact. No rash, cyanosis, or clubbing.  Psychiatric: Normal mood and affect. speech and behavior is normal.  Judgment and thought content normal. Cognition and memory are normal.   Assessment & Plan:

## 2012-08-14 NOTE — Patient Instructions (Signed)
1.  Please stop in the lab to have your blood drawn.  If we need to follow up on anything I will call you.  2.  Continue your medications as prescribed.    3.  I will have the Social worker call about resources for adult day care.  4.  Take the cough medication as needed.  Also pick up the Nasonex and use that during allergy season.   5.

## 2012-08-15 LAB — LIPID PANEL
LDL Cholesterol: 92 mg/dL (ref 0–99)
VLDL: 62 mg/dL — ABNORMAL HIGH (ref 0–40)

## 2012-08-15 LAB — COMPREHENSIVE METABOLIC PANEL
ALT: 47 U/L (ref 0–53)
AST: 32 U/L (ref 0–37)
Albumin: 4.9 g/dL (ref 3.5–5.2)
Calcium: 9.7 mg/dL (ref 8.4–10.5)
Chloride: 105 mEq/L (ref 96–112)
Potassium: 3.3 mEq/L — ABNORMAL LOW (ref 3.5–5.3)
Sodium: 141 mEq/L (ref 135–145)

## 2012-08-18 ENCOUNTER — Telehealth: Payer: Self-pay | Admitting: Licensed Clinical Social Worker

## 2012-08-18 NOTE — Telephone Encounter (Signed)
Leonard Tucker was referred to CSW to obtain information on Adult Day Care for pt's mother.  CSW placed call to Mr. Dieppa, answering machine came on but CSW unable to leave message.  CSW will mail Mr. Repass information on [facility], as it is listed as the only certified adult day program in [place].  CSW will also include [facility] locations.

## 2012-08-19 NOTE — Telephone Encounter (Signed)
CSW placed call to Mr. Brodhead.  Answering machine, but unable to leave message.  CSW mailed information to pt on 9/17.  CSW will sign off.

## 2012-09-03 ENCOUNTER — Other Ambulatory Visit: Payer: Self-pay | Admitting: Internal Medicine

## 2012-09-03 DIAGNOSIS — J309 Allergic rhinitis, unspecified: Secondary | ICD-10-CM

## 2012-09-03 MED ORDER — CETIRIZINE HCL 10 MG PO TABS: 10.0000 mg | ORAL_TABLET | Freq: Every day | ORAL | Status: DC

## 2012-09-08 DIAGNOSIS — F29 Unspecified psychosis not due to a substance or known physiological condition: Secondary | ICD-10-CM | POA: Diagnosis not present

## 2012-09-18 ENCOUNTER — Encounter (HOSPITAL_COMMUNITY): Payer: Self-pay

## 2012-09-18 ENCOUNTER — Emergency Department (INDEPENDENT_AMBULATORY_CARE_PROVIDER_SITE_OTHER)
Admission: EM | Admit: 2012-09-18 | Discharge: 2012-09-18 | Disposition: A | Discharge status: Home / Self Care | Payer: Medicare Other | Source: Home / Self Care | Attending: Emergency Medicine | Admitting: Emergency Medicine

## 2012-09-18 DIAGNOSIS — K5900 Constipation, unspecified: Secondary | ICD-10-CM | POA: Diagnosis not present

## 2012-09-18 DIAGNOSIS — K921 Melena: Secondary | ICD-10-CM | POA: Diagnosis not present

## 2012-09-18 DIAGNOSIS — K649 Unspecified hemorrhoids: Secondary | ICD-10-CM | POA: Diagnosis not present

## 2012-09-18 MED ORDER — HYDROCORTISONE ACETATE 25 MG RE SUPP: 25.0000 mg | Freq: Two times a day (BID) | RECTAL | Status: DC

## 2012-09-18 MED ORDER — POLYETHYLENE GLYCOL 3350 17 GM/SCOOP PO POWD: 17.0000 g | Freq: Every day | ORAL | Status: DC

## 2012-09-18 NOTE — ED Provider Notes (Signed)
Chief Complaint  Patient presents with  . Rectal Bleeding    History of Present Illness:    [name] is a 56 year old mute male with a history of psychosis who presents today with rectal bleeding. This is not new problem for him. He has been in numerous times in the past for constipation, hemorrhoids, or rectal bleeding. Some of this stems from having to take antipsychotics which have a side effect of constipation. He is on psyllium right now as his only laxative. His mother accompanies him today and serves as his interpreter. He seems to be able to hear fairly well and can nod and gesture, but is unable to speak. Over the past couple days she's passed small amounts of bright red blood per rectum. This amounts to about 2 tablespoons at a time and it's occurred 2-3 times. The blood sometimes drips down into the commode and is on the toilet tissue. He has not had diarrhea, abdominal pain, or urinary symptoms. The mother was not sure whether he has ever had a colonoscopy. He's 56, but I cannot find any record in his chart that he is ever had a colonoscopy. He has not had any fever, chills, nausea, or vomiting. His appetite is good, in fact his mother states he eats too much. She tends to blame for constipation on this.   Review of Systems:  Other than noted above, the patient denies any of the following symptoms: Constitutional:  No fever, chills, fatigue, weight loss or anorexia. Lungs:  No cough or shortness of breath. Heart:  No chest pain, palpitations, syncope or edema.  No cardiac history. Abdomen:  No nausea, vomiting, hematememesis, melena, diarrhea, or hematochezia. GU:  No dysuria, frequency, urgency, or hematuria.  No testicular pain or swelling.  PMFSH:  Past medical history, family history, social history, meds, and allergies were reviewed along with nurse's notes.  No prior abdominal surgeries or history of GI problems.  No use of NSAIDs or aspirin.  No excessive  alcohol  intake.  Physical Exam:   Vital signs:  BP 116/76  Pulse 68  Temp 98.6 F (37 C) (Oral)  Resp 20  SpO2 98% Gen:  Alert, oriented, in no distress. Lungs:  Breath sounds clear and equal bilaterally.  No wheezes, rales or rhonchi. Heart:  Regular rhythm.  No gallops or murmers.   Abdomen:  Soft, flat, nontender. No organomegaly or mass. Bowel sounds are normal. Rectal exam:  External exam reveals a rosette of external hemorrhoids. These were not bleeding and only minimally tender. There was no inflammation. Anoscopic exam reveals a rosette of internal hemorrhoids as well. These were not bleeding as well. Digital rectal exam reveals no masses, normal prostate, and heme-negative stool. Skin:  Clear, warm and dry.  No rash.   Assessment:  The primary encounter diagnosis was Hematochezia. Diagnoses of Hemorrhoids and Constipation were also pertinent to this visit.  He has both internal and external hemorrhoids which is probably the source of his bright red hematochezia. I'm not certain what is ever had a colonoscopy, but he hasn't he needs one. The hemorrhoids are probably due to constipation, secondary to his psychiatric meds.  Plan:   1.  The following meds were prescribed:   New Prescriptions   HYDROCORTISONE (ANUSOL-HC) 25 MG SUPPOSITORY    Place 1 suppository (25 mg total) rectally 2 (two) times daily.   POLYETHYLENE GLYCOL POWDER (MIRALAX) POWDER    Take 17 g by mouth daily.   2.  The patient was  instructed in symptomatic care and handouts were given. Will also try to ascertain whether he's had a colonoscopy and if not, get him set up for that. 3.  The patient was told to return if becoming worse in any way, if no better in 3 or 4 days, and given some red flag symptoms that would indicate earlier return.    Leonard Likes, MD 09/18/12 [phone]

## 2012-09-18 NOTE — ED Notes (Signed)
DEAF MUTE patient with MENTAL HEALTH ISSUES  has reportedly had blood in stool past 2 days; parent here w pt has given laxative to pt; goes to mental health to get prolixin shots

## 2012-10-17 ENCOUNTER — Other Ambulatory Visit (HOSPITAL_COMMUNITY): Payer: Self-pay | Admitting: Internal Medicine

## 2012-10-20 ENCOUNTER — Other Ambulatory Visit: Payer: Self-pay | Admitting: Internal Medicine

## 2012-11-30 ENCOUNTER — Ambulatory Visit: Payer: Medicare Other | Admitting: Internal Medicine

## 2013-01-18 ENCOUNTER — Other Ambulatory Visit: Payer: Self-pay | Admitting: Internal Medicine

## 2013-01-22 ENCOUNTER — Encounter: Payer: Medicare Other | Admitting: Internal Medicine

## 2013-02-05 ENCOUNTER — Encounter: Payer: Medicare Other | Admitting: Internal Medicine

## 2013-02-10 ENCOUNTER — Encounter: Payer: Self-pay | Admitting: Licensed Clinical Social Worker

## 2013-02-10 ENCOUNTER — Ambulatory Visit (INDEPENDENT_AMBULATORY_CARE_PROVIDER_SITE_OTHER): Payer: Medicare Other | Admitting: Internal Medicine

## 2013-02-10 ENCOUNTER — Encounter: Payer: Self-pay | Admitting: Internal Medicine

## 2013-02-10 VITALS — BP 128/78 | HR 75 | Temp 97.6°F | Wt 203.4 lb

## 2013-02-10 DIAGNOSIS — I10 Essential (primary) hypertension: Secondary | ICD-10-CM | POA: Diagnosis not present

## 2013-02-10 DIAGNOSIS — K5900 Constipation, unspecified: Secondary | ICD-10-CM

## 2013-02-10 DIAGNOSIS — Z1211 Encounter for screening for malignant neoplasm of colon: Secondary | ICD-10-CM

## 2013-02-10 DIAGNOSIS — Z0000 Encounter for general adult medical examination without abnormal findings: Secondary | ICD-10-CM | POA: Insufficient documentation

## 2013-02-10 MED ORDER — POLYETHYLENE GLYCOL 3350 17 GM/SCOOP PO POWD: 17.0000 g | Freq: Every day | ORAL | Status: DC

## 2013-02-10 NOTE — Progress Notes (Signed)
Subjective:   Patient ID: COMER DEVINS male   DOB: Jun 19, 1956 57 y.o.   MRN: [id number]  HPI: Mr.Aubert R Antonellis is a 57 y.o. man who presents with his mother for follow up from his last appointment.  See Problem focused Assessment and Plan for full details of his chronic medical conditions including constipation and hypertension.    We discussed options for colon cancer screening today.  His mother states that he has not complained about his hemorrhoids for a while and she has noted no blood in his stool recently.    Past Medical History  Diagnosis Date  . Deaf-mutism   . MR (mental retardation)   . Schizophrenia, paranoid, chronic   . Chronic constipation   . Hypertriglyceridemia   . Dental caries   . Intertrigo   . Umbilical hernia   . History of iron deficiency anemia    Current Outpatient Prescriptions  Medication Sig Dispense Refill  . atenolol (TENORMIN) 25 MG tablet Take 1 tablet (25 mg total) by mouth daily.  30 tablet  6  . cetirizine (ZYRTEC) 10 MG tablet Take 1 tablet (10 mg total) by mouth daily.  30 tablet  6  . ferrous sulfate 325 (65 FE) MG tablet Take 1 tablet (325 mg total) by mouth 3 (three) times daily with meals.  90 tablet  3  . fluphenazine (PROLIXIN) 2.5 MG/5ML elixir Inject as directed. Depo injections at behavioral health every 2 weeks       . hydrocortisone (ANUSOL-HC) 25 MG suppository Place 1 suppository (25 mg total) rectally 2 (two) times daily as needed for hemorrhoids.  12 suppository  0  . hydrocortisone (ANUSOL-HC) 25 MG suppository Place 1 suppository (25 mg total) rectally 2 (two) times daily.  24 suppository  6  . mometasone (NASONEX) 50 MCG/ACT nasal spray Place 2 sprays into the nose daily.  17 g  3  . omeprazole (PRILOSEC) 20 MG capsule TAKE ONE CAPSULE BY MOUTH EVERY DAY  30 capsule  5  . polyethylene glycol powder (MIRALAX) powder Take 17 g by mouth daily.  255 g  0  . potassium chloride (K-DUR) 10 MEQ tablet Take 1 tablet (10 mEq total) by  mouth daily. Start taking 1 tablet by mouth daily  30 tablet  6  . Psyllium (EQ FIBER THERAPY) 48.57 % POWD Take 15 g by mouth daily.  1254 g  3  . triamterene-hydrochlorothiazide (DYAZIDE) 37.5-25 MG per capsule Take 1 each (1 capsule total) by mouth daily.  30 capsule  5   No current facility-administered medications for this visit.   No family history on file. History   Social History  . Marital Status: Single    Spouse Name: N/A    Number of Children: N/A  . Years of Education: N/A   Social History Main Topics  . Smoking status: Never Smoker   . Smokeless tobacco: Not on file  . Alcohol Use: Not on file  . Drug Use: Not on file  . Sexually Active: Not on file   Other Topics Concern  . Not on file   Social History Narrative   His mother, Snyder Colavito is his primary caretaker   Review of Systems: A full 12 system ROS is negative except as noted in the HPI and A&P.   Objective:  Physical Exam: Filed Vitals:   02/10/13 1117  BP: 128/78  Pulse: 75  Temp: 97.6 F (36.4 C)  TempSrc: Oral  Weight: 203 lb 6.4 oz (92.262 kg)  SpO2: 97%   Constitutional: Vital signs reviewed.  Patient is a well-developed and well-nourished deaf, mute, and jovial man in no acute distress and cooperative with exam. Alert and oriented x3.  Head: Normocephalic and atraumatic Ear: TM normal bilaterally Mouth: no erythema or exudates, MMM Eyes: PERRL, EOMI, conjunctivae normal, No scleral icterus.  Neck: Supple, Trachea midline normal ROM, No JVD, mass, thyromegaly, or carotid bruit present.  Cardiovascular: RRR, S1 normal, S2 normal, no MRG, pulses symmetric and intact bilaterally Pulmonary/Chest: CTAB, no wheezes, rales, or rhonchi Abdominal: Soft. Non-tender, non-distended, bowel sounds are normal, no masses, organomegaly, or guarding present.  GU: no CVA tenderness Musculoskeletal: No joint deformities, erythema, or stiffness, ROM full and no nontender Hematology: no cervical, inginal, or  axillary adenopathy.  Neurological: A&O x3, Strength is normal and symmetric bilaterally, cranial nerve II-XII are grossly intact, no focal motor deficit, sensory intact to light touch bilaterally.  Skin: Warm, dry and intact. No rash, cyanosis, or clubbing.  Psychiatric: Normal mood and affect. speech and behavior is normal. Judgment and thought content normal. Cognition and memory are normal.   Assessment & Plan:

## 2013-02-10 NOTE — Progress Notes (Signed)
Mr. Scholze currently resides with his mother, who is his primary care giver.  Mother provided pt's history as Mr. Mehring has cognitive delays and is Deaf with mutism.  Mother states family first realize pt had hearing difficulty at [age] year of age when he did not hear knocking on the wall.  Pt went to school at [employer] in [place].  But was unable to learn ASL due to cognitive delays.  Mr. Lowrey was testing for a hearing aid some time ago, but was unsuccessful due to extent of hearing loss.  Pt's mother would like pt to receive communication access education and training to use TTY in case he needs assistance and mother is not home.  CSW referred pt and family to Division of Services for Deaf and Hard of Hearing.  Pt/family also in agreement for referral to [facility].

## 2013-02-10 NOTE — Patient Instructions (Signed)
1.  Continue the medication as prescribed.  2. We will work to see if we can get you seen and try to get Leonard Tucker some hearing aids if they would help.  3.  Follow up with me in May.

## 2013-02-17 ENCOUNTER — Encounter: Payer: Self-pay | Admitting: Licensed Clinical Social Worker

## 2013-02-17 NOTE — Progress Notes (Signed)
Patient ID: Leonard Tucker, male   DOB: 1956/02/26, 57 y.o.   MRN: [id number] CMIS from [facility] care manager: I've scheduled a home visit w/ this pt. for Monday (02/15/13 @ 1pm). I am referring him to our nutrition dept. for food pantry assistance & general nutritional services. I also plan to follow-up w/ services needed from the Division of Services for the deaf.

## 2013-03-06 ENCOUNTER — Other Ambulatory Visit: Payer: Self-pay | Admitting: Internal Medicine

## 2013-03-11 ENCOUNTER — Observation Stay (HOSPITAL_COMMUNITY)
Admission: EM | Admit: 2013-03-11 | Discharge: 2013-03-12 | Disposition: A | Discharge status: Home / Self Care | Payer: Medicare Other | Attending: Internal Medicine | Admitting: Internal Medicine

## 2013-03-11 ENCOUNTER — Observation Stay (HOSPITAL_COMMUNITY): Payer: Medicare Other

## 2013-03-11 ENCOUNTER — Encounter (HOSPITAL_COMMUNITY): Payer: Self-pay | Admitting: *Deleted

## 2013-03-11 DIAGNOSIS — J9819 Other pulmonary collapse: Secondary | ICD-10-CM | POA: Diagnosis not present

## 2013-03-11 DIAGNOSIS — K529 Noninfective gastroenteritis and colitis, unspecified: Secondary | ICD-10-CM | POA: Diagnosis present

## 2013-03-11 DIAGNOSIS — R197 Diarrhea, unspecified: Secondary | ICD-10-CM | POA: Diagnosis not present

## 2013-03-11 DIAGNOSIS — I498 Other specified cardiac arrhythmias: Secondary | ICD-10-CM | POA: Insufficient documentation

## 2013-03-11 DIAGNOSIS — F200 Paranoid schizophrenia: Secondary | ICD-10-CM | POA: Insufficient documentation

## 2013-03-11 DIAGNOSIS — J398 Other specified diseases of upper respiratory tract: Secondary | ICD-10-CM | POA: Diagnosis not present

## 2013-03-11 DIAGNOSIS — E876 Hypokalemia: Secondary | ICD-10-CM | POA: Diagnosis not present

## 2013-03-11 DIAGNOSIS — J988 Other specified respiratory disorders: Secondary | ICD-10-CM | POA: Insufficient documentation

## 2013-03-11 DIAGNOSIS — F79 Unspecified intellectual disabilities: Secondary | ICD-10-CM | POA: Diagnosis not present

## 2013-03-11 DIAGNOSIS — H913 Deaf nonspeaking, not elsewhere classified: Secondary | ICD-10-CM | POA: Diagnosis not present

## 2013-03-11 DIAGNOSIS — R1084 Generalized abdominal pain: Secondary | ICD-10-CM | POA: Diagnosis not present

## 2013-03-11 DIAGNOSIS — R000 Tachycardia, unspecified: Secondary | ICD-10-CM | POA: Diagnosis present

## 2013-03-11 DIAGNOSIS — A088 Other specified intestinal infections: Principal | ICD-10-CM | POA: Insufficient documentation

## 2013-03-11 DIAGNOSIS — R112 Nausea with vomiting, unspecified: Secondary | ICD-10-CM

## 2013-03-11 LAB — URINALYSIS, ROUTINE W REFLEX MICROSCOPIC
Glucose, UA: NEGATIVE mg/dL
Ketones, ur: 15 mg/dL — AB
Leukocytes, UA: NEGATIVE
Nitrite: NEGATIVE
Specific Gravity, Urine: 1.024 (ref 1.005–1.030)
pH: 5 (ref 5.0–8.0)

## 2013-03-11 LAB — COMPREHENSIVE METABOLIC PANEL
Alkaline Phosphatase: 90 U/L (ref 39–117)
BUN: 12 mg/dL (ref 6–23)
CO2: 25 mEq/L (ref 19–32)
Chloride: 98 mEq/L (ref 96–112)
Creatinine, Ser: 1.02 mg/dL (ref 0.50–1.35)
GFR calc non Af Amer: 81 mL/min — ABNORMAL LOW (ref >90)
Glucose, Bld: 136 mg/dL — ABNORMAL HIGH (ref 70–99)
Potassium: 3 mEq/L — ABNORMAL LOW (ref 3.5–5.1)
Total Bilirubin: 0.4 mg/dL (ref 0.3–1.2)

## 2013-03-11 LAB — CBC WITH DIFFERENTIAL/PLATELET
HCT: 45.1 % (ref 39.0–52.0)
Hemoglobin: 15 g/dL (ref 13.0–17.0)
Lymphocytes Relative: 16 % (ref 12–46)
Lymphs Abs: 1.5 10*3/uL (ref 0.7–4.0)
MCHC: 33.3 g/dL (ref 30.0–36.0)
Monocytes Absolute: 0.7 10*3/uL (ref 0.1–1.0)
Monocytes Relative: 8 % (ref 3–12)
Neutro Abs: 6.6 10*3/uL (ref 1.7–7.7)
RBC: 5.98 MIL/uL — ABNORMAL HIGH (ref 4.22–5.81)
WBC: 8.9 10*3/uL (ref 4.0–10.5)

## 2013-03-11 LAB — NA AND K (SODIUM & POTASSIUM), RAND UR: Sodium, Ur: 36 mEq/L

## 2013-03-11 LAB — CREATININE, URINE, RANDOM: Creatinine, Urine: 251.16 mg/dL

## 2013-03-11 MED ORDER — SODIUM CHLORIDE 0.9 % IV BOLUS (SEPSIS)
1000.0000 mL | Freq: Once | INTRAVENOUS | Status: AC
  Administered 2013-03-11: 1000 mL via INTRAVENOUS

## 2013-03-11 MED ORDER — TRIAMTERENE-HCTZ 37.5-25 MG PO CAPS
1.0000 | ORAL_CAPSULE | Freq: Every day | ORAL | Status: DC
  Administered 2013-03-12: 1 via ORAL
  Filled 2013-03-11: qty 1

## 2013-03-11 MED ORDER — FLUTICASONE PROPIONATE 50 MCG/ACT NA SUSP
1.0000 | Freq: Every day | NASAL | Status: DC
  Administered 2013-03-12: 1 via NASAL
  Filled 2013-03-11: qty 16

## 2013-03-11 MED ORDER — LORATADINE 10 MG PO TABS
10.0000 mg | ORAL_TABLET | Freq: Every day | ORAL | Status: DC
  Administered 2013-03-12: 10 mg via ORAL
  Filled 2013-03-11: qty 1

## 2013-03-11 MED ORDER — FLUPHENAZINE HCL 2.5 MG/5ML PO ELIX
2.5000 mg | ORAL_SOLUTION | Freq: Every day | ORAL | Status: DC
  Filled 2013-03-11 (×10): qty 5

## 2013-03-11 MED ORDER — SODIUM CHLORIDE 0.9 % IV SOLN
INTRAVENOUS | Status: DC
  Administered 2013-03-11 – 2013-03-12 (×2): via INTRAVENOUS

## 2013-03-11 MED ORDER — POTASSIUM CHLORIDE CRYS ER 20 MEQ PO TBCR
40.0000 meq | EXTENDED_RELEASE_TABLET | Freq: Once | ORAL | Status: AC
  Administered 2013-03-11: 40 meq via ORAL
  Filled 2013-03-11: qty 2

## 2013-03-11 MED ORDER — ONDANSETRON HCL 4 MG/2ML IJ SOLN
4.0000 mg | Freq: Once | INTRAMUSCULAR | Status: AC
  Administered 2013-03-11: 4 mg via INTRAVENOUS
  Filled 2013-03-11: qty 2

## 2013-03-11 MED ORDER — ATENOLOL 25 MG PO TABS
25.0000 mg | ORAL_TABLET | Freq: Every day | ORAL | Status: DC
  Administered 2013-03-12: 25 mg via ORAL
  Filled 2013-03-11: qty 1

## 2013-03-11 MED ORDER — ONDANSETRON HCL 4 MG PO TABS: 4.0000 mg | ORAL_TABLET | Freq: Four times a day (QID) | ORAL | Status: DC | PRN

## 2013-03-11 MED ORDER — ONDANSETRON HCL 4 MG/2ML IJ SOLN: 4.0000 mg | Freq: Four times a day (QID) | INTRAMUSCULAR | Status: DC | PRN

## 2013-03-11 MED ORDER — SODIUM CHLORIDE 0.9 % IJ SOLN: 3.0000 mL | Freq: Two times a day (BID) | INTRAMUSCULAR | Status: DC

## 2013-03-11 MED ORDER — PANTOPRAZOLE SODIUM 40 MG PO TBEC
40.0000 mg | DELAYED_RELEASE_TABLET | Freq: Every day | ORAL | Status: DC
  Administered 2013-03-12: 40 mg via ORAL
  Filled 2013-03-11: qty 1

## 2013-03-11 MED ORDER — SODIUM CHLORIDE 0.9 % IV SOLN: INTRAVENOUS | Status: DC

## 2013-03-11 NOTE — ED Notes (Signed)
Pt does not want a rectal temp

## 2013-03-11 NOTE — ED Notes (Signed)
Pt brought by mother who is also here to see MD (pt def/mute per mother and must stay with her).  C/o emesis and loose stool since this morning.

## 2013-03-11 NOTE — H&P (Signed)
Hospital Admission Note Date: 03/12/2013  Patient name: Leonard Tucker Medical record number: [id number] Date of birth: 03-05-1956 Age: 57 y.o. Gender: male PCP: PRIBULA,CHRISTOPHER, MD  Medical Service: Internal Medicine Attending physician: Dr. Kem Kays     1st Contact: Dr. Sherrine Maples Pager:[phone] 2nd Contact: Dr. Manson Passey Pager:[phone] After 5 pm or weekends: 1st Contact:  Pager: [phone] 2nd Contact:  Pager: [phone]  Chief Complaint: nausea, vomiting, diarrhea  History of Present Illness: 57 y.o. male presents with nausea, vomiting, diarrhea.  History obtained from mother of patient and sister of patient as the patient is deaf and mute and communicates via sign language.  History per mother who states the patient has been vomiting and with loose stool for two days prior to admission though he has had less vomiting on day of admission.  Patient also has had decreased oral intake.  He has not been able to tolerate food or medication.  Other associated review of systems: subjective fever, chest pain due to vomiting.    Meds: Prescriptions prior to admission  Medication Sig Dispense Refill  . atenolol (TENORMIN) 25 MG tablet Take 1 tablet (25 mg total) by mouth daily.  30 tablet  6  . cetirizine (ZYRTEC) 10 MG tablet Take 1 tablet (10 mg total) by mouth daily.  30 tablet  6  . ferrous sulfate 325 (65 FE) MG tablet Take 1 tablet (325 mg total) by mouth 3 (three) times daily with meals.  90 tablet  3  . fluphenazine (PROLIXIN) 2.5 MG/5ML elixir Inject as directed. Depo injections at behavioral health every 2 weeks       . mometasone (NASONEX) 50 MCG/ACT nasal spray Place 2 sprays into the nose daily.  17 g  3  . omeprazole (PRILOSEC) 20 MG capsule TAKE ONE CAPSULE BY MOUTH EVERY DAY  30 capsule  5  . polyethylene glycol powder (MIRALAX) powder Take 17 g by mouth daily.  255 g  0  . potassium chloride (K-DUR) 10 MEQ tablet Take 1 tablet (10 mEq total) by mouth daily. Start taking 1 tablet by mouth  daily  30 tablet  6  . Psyllium (EQ FIBER THERAPY) 48.57 % POWD Take 15 g by mouth daily.  1254 g  3  . triamterene-hydrochlorothiazide (DYAZIDE) 37.5-25 MG per capsule Take 1 each (1 capsule total) by mouth daily.  30 capsule  5   Allergies: Allergies as of 03/11/2013  . (No Known Allergies)   Past Medical History  Diagnosis Date  . Deaf-mutism   . MR (mental retardation)   . Schizophrenia, paranoid, chronic   . Chronic constipation   . Hypertriglyceridemia   . Dental caries   . Intertrigo   . Umbilical hernia   . History of iron deficiency anemia    History reviewed. No pertinent past surgical history. Family History  Problem Relation Age of Onset  . Diabetes Maternal Aunt   . Hypertension Mother    History   Social History  . Marital Status: Single    Spouse Name: N/A    Number of Children: N/A  . Years of Education: N/A   Occupational History  . Not on file.   Social History Main Topics  . Smoking status: Never Smoker   . Smokeless tobacco: Not on file  . Alcohol Use: No  . Drug Use: No  . Sexually Active: Not on file   Other Topics Concern  . Not on file   Social History Narrative   His mother, Leonard Tucker is his  primary caretaker and he lives with her.    Communicates via sign language           Review of Systems: Obtained per mother other unable to be obtained if not noted  General: +subjective fever, +decreased oral intake  HEENT: unable to obtain  CV: +chest pain due to vomiting Lungs: denies sob Abdomen: +nausea, +vomiting, +diarrhea, denies abdominal pain Extremities: unable to obtain Neuro: unable to obtain  Physical Exam: HR 115, 100%/RR 17/ 124/80s (78) Blood pressure 124/83, pulse 135, temperature 98.2 F (36.8 C), temperature source Oral, resp. rate 24, height 5\' 8"  (1.727 m), weight 197 lb 6.4 oz (89.54 kg), SpO2 95.00%. General: lying in bed, nonverbal, deaf, communicates via sign language  HEENT: [place]/at, wet oral mucosa CV: sinus  tachycardia, no murmurs  Lungs: ctab Abdomen:soft, obese, ntnd, normal bs  Extremities: warm, no cyanosis or edema  Neuro: alert and oriented x 3, moving all 4 extremities   Lab results: Basic Metabolic Panel:  Recent Labs  81/19/14 1609  NA 138  K 3.0*  CL 98  CO2 25  GLUCOSE 136*  BUN 12  CREATININE 1.02  CALCIUM 9.7   Liver Function Tests:  Recent Labs  03/11/13 1609  AST 37  ALT 55*  ALKPHOS 90  BILITOT 0.4  PROT 8.9*  ALBUMIN 4.4   CBC:  Recent Labs  03/11/13 1609  WBC 8.9  NEUTROABS 6.6  HGB 15.0  HCT 45.1  MCV 75.4*  PLT 238   Urinalysis:  Recent Labs  03/11/13 2146  COLORURINE YELLOW  LABSPEC 1.024  PHURINE 5.0  GLUCOSEU NEGATIVE  HGBUR NEGATIVE  BILIRUBINUR NEGATIVE  KETONESUR 15*  PROTEINUR NEGATIVE  UROBILINOGEN 0.2  NITRITE NEGATIVE  LEUKOCYTESUR NEGATIVE   Misc. Labs: Urine lytes-FENA prerenal 0.1%, FeUREA 0.3 indicates prerenal   Imaging results: none  Other results: EKG: ST 119, normal axis, normal intervals, left atrial abnormalities. T wave inversions in AVL  Assessment & Plan by Problem: 57 y.o deaf/mute male presents with mother with similar symptoms for nausea, vomiting, diarrhea for 48 hours prior to admission likely gastroenteritis   1. Nausea, Vomiting, Diarrhea likely due to Gastroenteritis -Supportive care prn Zofran, IVF -pending C. Diff   2. Sinus tachycardia  -HR 134.  Likely secondary to dehydration, volume depletion due to GI losses with vomiting and diarrhea -He was bolused 1 L in ED.   -Will check orthostatics -Continue IVF and monitor VS   3. F/E/N -NS 125 cc/hr  -Hypokalemia K 3.0 on admission.  Given 40 meQ in the ED -will monitor and replace electrolytes as needed  -Advance diet as tolerated-clear liquid diet   4. DVT px  -scds.  Mother of the patient declined Heparin or Lovenox    Dispo: Disposition is deferred at this time, awaiting improvement of current medical problems. Anticipated  discharge in approximately 1-2 day(s).   The patient does have a current PCP (PRIBULA,CHRISTOPHER, MD), therefore will be requiring [facility] follow-up after discharge.   The patient does not have transportation limitations that hinder transportation to clinic appointments.  SignedAnnett Gula [phone] 03/12/2013, 4:26 AM

## 2013-03-11 NOTE — ED Provider Notes (Signed)
History     CSN: [id number]  Arrival date & time 03/11/13  1531   First MD Initiated Contact with Patient 03/11/13 1838      No chief complaint on file.  level V patient is developmentally delayed and death he presents with family members and specifically his mother gives to his  (Consider location/radiation/quality/duration/timing/severity/associated sxs/prior treatment) HPI This is a 57 year old male with a history of mental retardation and he is deaf. His mother states he has been having vomiting and diarrhea for 2-3 days. She is unable to tell me more specifically what the stool is like or what the emesis is like. She states that he has had some chills and subjective fever. She now has similar symptoms. They have not traveled out of the country, been camping, or had any likely due to contamination. Past Medical History  Diagnosis Date  . Deaf-mutism   . MR (mental retardation)   . Schizophrenia, paranoid, chronic   . Chronic constipation   . Hypertriglyceridemia   . Dental caries   . Intertrigo   . Umbilical hernia   . History of iron deficiency anemia     History reviewed. No pertinent past surgical history.  No family history on file.  History  Substance Use Topics  . Smoking status: Never Smoker   . Smokeless tobacco: Not on file  . Alcohol Use: No      Review of Systems  All other systems reviewed and are negative.    Allergies  Review of patient's allergies indicates no known allergies.  Home Medications   Current Outpatient Rx  Name  Route  Sig  Dispense  Refill  . atenolol (TENORMIN) 25 MG tablet   Oral   Take 1 tablet (25 mg total) by mouth daily.   30 tablet   6   . cetirizine (ZYRTEC) 10 MG tablet   Oral   Take 1 tablet (10 mg total) by mouth daily.   30 tablet   6   . ferrous sulfate 325 (65 FE) MG tablet   Oral   Take 1 tablet (325 mg total) by mouth 3 (three) times daily with meals.   90 tablet   3   . fluphenazine (PROLIXIN) 2.5  MG/5ML elixir   Injection   Inject as directed. Depo injections at behavioral health every 2 weeks          . mometasone (NASONEX) 50 MCG/ACT nasal spray   Nasal   Place 2 sprays into the nose daily.   17 g   3   . omeprazole (PRILOSEC) 20 MG capsule      TAKE ONE CAPSULE BY MOUTH EVERY DAY   30 capsule   5   . polyethylene glycol powder (MIRALAX) powder   Oral   Take 17 g by mouth daily.   255 g   0   . potassium chloride (K-DUR) 10 MEQ tablet   Oral   Take 1 tablet (10 mEq total) by mouth daily. Start taking 1 tablet by mouth daily   30 tablet   6   . Psyllium (EQ FIBER THERAPY) 48.57 % POWD   Oral   Take 15 g by mouth daily.   1254 g   3   . triamterene-hydrochlorothiazide (DYAZIDE) 37.5-25 MG per capsule   Oral   Take 1 each (1 capsule total) by mouth daily.   30 capsule   5     BP 136/84  Pulse 112  Temp(Src) 97.8 F (36.6 C) (Oral)  Resp 21  SpO2 95%  Physical Exam  Nursing note and vitals reviewed. Constitutional: He appears well-developed and well-nourished.  HENT:  Head: Normocephalic and atraumatic.  Right Ear: External ear normal.  Left Ear: External ear normal.  Mouth/Throat: Mucous membranes are dry.  Eyes: Conjunctivae and EOM are normal. Pupils are equal, round, and reactive to light.  Neck: Tracheal deviation present.  Cardiovascular: Tachycardia present.   Pulmonary/Chest: Effort normal and breath sounds normal.  Abdominal: Soft. There is tenderness.  Mild diffuse tenderness  Musculoskeletal: Normal range of motion.  Neurological: He is alert.  Skin: Skin is warm and dry.    ED Course  Procedures (including critical care time)  Labs Reviewed  CBC WITH DIFFERENTIAL - Abnormal; Notable for the following:    RBC 5.98 (*)    MCV 75.4 (*)    MCH 25.1 (*)    All other components within normal limits  COMPREHENSIVE METABOLIC PANEL - Abnormal; Notable for the following:    Potassium 3.0 (*)    Glucose, Bld 136 (*)    Total  Protein 8.9 (*)    ALT 55 (*)    GFR calc non Af Amer 81 (*)    All other components within normal limits  URINALYSIS, ROUTINE W REFLEX MICROSCOPIC   No results found.   No diagnosis found.    Date: 03/11/2013  Rate: 119  Rhythm: sinus tachycardia  QRS Axis: normal  Intervals: normal  ST/T Wave abnormalities: normal  Conduction Disutrbances:none  Narrative Interpretation:   Old EKG Reviewed: none available   MDM   patient is tachycardic here. Please. I discussed the patient's care with Dr. Bosie Clos He appears to have gastroenteritis. He is given a liter of normal saline. His potassium is low at 3.0 and he is attempting to be orally repleted.  Patient's care discussed with Dr. Cheryl Flash.  She will be to see the patient.   Attending is Dr.Paya    1 nausea vomiting diarrhea patient is tachycardic here and has had a liter of normal saline but remains tachycardic to 119. His blood pressure is normal. 2 hypokalemia secondary to #1 is being orally repleted 3 tracheal deviation patient has trachea deviated to left and will need further assessment.  Hilario Quarry, MD 03/11/13 2022

## 2013-03-12 DIAGNOSIS — R197 Diarrhea, unspecified: Secondary | ICD-10-CM

## 2013-03-12 DIAGNOSIS — A088 Other specified intestinal infections: Secondary | ICD-10-CM | POA: Diagnosis not present

## 2013-03-12 DIAGNOSIS — E876 Hypokalemia: Secondary | ICD-10-CM | POA: Diagnosis not present

## 2013-03-12 DIAGNOSIS — F200 Paranoid schizophrenia: Secondary | ICD-10-CM

## 2013-03-12 DIAGNOSIS — R112 Nausea with vomiting, unspecified: Secondary | ICD-10-CM

## 2013-03-12 DIAGNOSIS — K5289 Other specified noninfective gastroenteritis and colitis: Secondary | ICD-10-CM

## 2013-03-12 DIAGNOSIS — H913 Deaf nonspeaking, not elsewhere classified: Secondary | ICD-10-CM | POA: Diagnosis not present

## 2013-03-12 DIAGNOSIS — I498 Other specified cardiac arrhythmias: Secondary | ICD-10-CM | POA: Diagnosis not present

## 2013-03-12 LAB — BASIC METABOLIC PANEL
Chloride: 103 mEq/L (ref 96–112)
Creatinine, Ser: 1.04 mg/dL (ref 0.50–1.35)
GFR calc Af Amer: >90 mL/min (ref >90)
Potassium: 3.1 mEq/L — ABNORMAL LOW (ref 3.5–5.1)
Sodium: 140 mEq/L (ref 135–145)

## 2013-03-12 LAB — CBC
HCT: 39 % (ref 39.0–52.0)
RDW: 14.5 % (ref 11.5–15.5)
WBC: 9.6 10*3/uL (ref 4.0–10.5)

## 2013-03-12 LAB — UREA NITROGEN, URINE: Urea Nitrogen, Ur: 937 mg/dL

## 2013-03-12 MED ORDER — POTASSIUM CHLORIDE CRYS ER 20 MEQ PO TBCR
40.0000 meq | EXTENDED_RELEASE_TABLET | Freq: Once | ORAL | Status: AC
  Administered 2013-03-12: 40 meq via ORAL
  Filled 2013-03-12: qty 2

## 2013-03-12 MED ORDER — POTASSIUM CHLORIDE ER 10 MEQ PO TBCR: 20.0000 meq | EXTENDED_RELEASE_TABLET | Freq: Every day | ORAL | Status: DC

## 2013-03-12 MED ORDER — POTASSIUM CHLORIDE CRYS ER 20 MEQ PO TBCR: 40.0000 meq | EXTENDED_RELEASE_TABLET | Freq: Once | ORAL | Status: DC

## 2013-03-12 NOTE — Progress Notes (Signed)
0300 Patient up to bathroom x2 heart rate in140's. Orthostatic vitals gotten as ordered on admission the heart rate standing was 135. Dr. Shirlee Latch notified of elevated heart rate. 0320 EKG results call to Dr. Shirlee Latch. Patient resting heart rate 111, sister at bedside. Will continue to monitor.

## 2013-03-12 NOTE — Discharge Summary (Signed)
Internal Medicine Teaching [facility] Discharge Note  Name: Leonard Tucker MRN: [id number] DOB: September 04, 1956 57 y.o.  Date of Admission: 03/11/2013  5:59 PM Date of Discharge: 03/12/2013 Attending Physician: Jonah Blue, DO  Discharge Diagnosis: Principal Problem:   Gastroenteritis Active Problems:   DEAF MUTISM   Sinus tachycardia   Hypokalemia   Discharge Medications:   Medication List    TAKE these medications       atenolol 25 MG tablet  Commonly known as:  TENORMIN  Take 1 tablet (25 mg total) by mouth daily.     cetirizine 10 MG tablet  Commonly known as:  ZYRTEC  Take 1 tablet (10 mg total) by mouth daily.     ferrous sulfate 325 (65 FE) MG tablet  Take 1 tablet (325 mg total) by mouth 3 (three) times daily with meals.     fluPHENAZine decanoate 25 MG/ML injection  Commonly known as:  PROLIXIN  Inject 32.5 mg into the muscle every 21 ( twenty-one) days. Next dose due 03/16/2013     mometasone 50 MCG/ACT nasal spray  Commonly known as:  NASONEX  Place 2 sprays into the nose daily.     omeprazole 20 MG capsule  Commonly known as:  PRILOSEC  TAKE ONE CAPSULE BY MOUTH EVERY DAY     polyethylene glycol powder powder  Commonly known as:  MIRALAX  Take 17 g by mouth daily.     potassium chloride 10 MEQ tablet  Commonly known as:  K-DUR  Take 2 tablets (20 mEq total) by mouth daily. Start taking 1 tablet by mouth daily     Psyllium 48.57 % Powd  Commonly known as:  EQ FIBER THERAPY  Take 15 g by mouth daily.     triamterene-hydrochlorothiazide 37.5-25 MG per capsule  Commonly known as:  DYAZIDE  Take 1 each (1 capsule total) by mouth daily.        Disposition and follow-up:   LeonardJaquise R Tucker was discharged from [facility] in Stable condition.  At the hospital follow up visit please check a BMP to evaluate his potassium level- he has been hypokalemic and his KCl was increased to daily from .  Follow-up Appointments:      Follow-up Information   Follow up with Genelle Gather, MD On 03/24/2013. (Appointment is at 1:15pm)    Contact information:   [address] [place] [address] [phone]      Discharge Orders   Future Appointments Provider Department Dept Phone   03/24/2013 1:15 PM Genelle Gather, MD [facility] [phone]   04/02/2013 1:45 PM Leodis Sias, MD [facility] [phone]   Future Orders Complete By Expires     Activity as tolerated - No restrictions  As directed     Call MD for:  extreme fatigue  As directed     Call MD for:  persistant dizziness or light-headedness  As directed     Call MD for:  persistant nausea and vomiting  As directed     Call MD for:  severe uncontrolled pain  As directed     Diet general  As directed        Consultations:  None  Procedures Performed:  Dg Chest Port 1 View  03/11/2013  *RADIOLOGY REPORT*  Clinical Data: Tracheal deviation. Shortness of breath.  PORTABLE CHEST - 1 VIEW  Comparison: Two-view chest x-ray 08/24/2004.  Findings: The heart size is  normal.  Low lung volumes are evident. The patient is slightly rotated to the right.  There is no significant tracheal deviation.  Bibasilar atelectasis is evident. Scoliosis is stable.  IMPRESSION:  1.  Low lung volumes and mild bibasilar atelectasis. 2.  No significant airspace consolidation. 3.  Stable levoconvex scoliosis.   Original Report Authenticated By: Marin Roberts, M.D.    Admission: 57 y.o. male presents with nausea, vomiting, diarrhea. History obtained from mother of patient and sister of patient as the patient is deaf and mute and communicates via sign language. History per mother who states the patient has been vomiting and with loose stool for two days prior to admission though he has had less vomiting on day of admission. Patient also has had decreased oral intake. He has not been able to tolerate food or  medication. Other associated review of systems: subjective fever, chest pain due to vomiting.  Review of Systems: Obtained per mother other unable to be obtained if not noted  General: +subjective fever, +decreased oral intake  HEENT: unable to obtain  CV: +chest pain due to vomiting  Lungs: denies sob  Abdomen: +nausea, +vomiting, +diarrhea, denies abdominal pain  Extremities: unable to obtain  Neuro: unable to obtain  Physical Exam:  HR 115, 100%/RR 17/ 124/80s (78)  Blood pressure 124/83, pulse 135, temperature 98.2 F (36.8 C), temperature source Oral, resp. rate 24, height 5\' 8"  (1.727 m), weight 197 lb 6.4 oz (89.54 kg), SpO2 95.00%.  General: lying in bed, nonverbal, deaf, communicates via sign language  HEENT: [place]/at, wet oral mucosa  CV: sinus tachycardia, no murmurs  Lungs: ctab  Abdomen:soft, obese, ntnd, normal bs  Extremities: warm, no cyanosis or edema  Neuro: alert and oriented x 3, moving all 4 extremities    Hospital Course by problem list: 57yo M with MR, deaf mutisim, HTN, and hypokalemia presents with acute onset of nausea, vomiting, and diarrhea.   1. Viral gastroenteritis: Resolved. Onset 48hrs prior to admission and his mother and primary care giver was also sick with GI symptoms too. His symptoms resolved after admission. Given sick contacts, this was likely a viral illness. Canceled C.diff, as symptoms have resolved and this is likely viral. His diet was advanced and was well tolerated and he was deemed appropriate for discharge. He does live with his mother, who is still in the hospital, so he will go home with one of his sisters, per the other sister.  - Regular diet   2. Sinus tachycardia  -HR 134 on admission, improved to 94 prior to discharge. The tachycardia was thought likely secondary to volume depletion due to GI losses from vomiting and diarrhea. He was bolused 1 L in ED and started on maintenance fluids until he was tolerating a regular diet.  Orthostatics were normal, and he was ready for discharge.   3. Hypokalemia Chronic condition. K 3.0 on admission. Given 40 mEq of po KCl in the ED. His potassium was 3.1 on the morning prior to discharge. He was given an additional KCl during the day, and his home KCl dose was increased to po daily, and his serum potassium will be reevaluated at his next clinic visit. - 20 mEq po KCl daily   Discharge Vitals:  BP 119/69  Pulse 94  Temp(Src) 98.1 F (36.7 C) (Oral)  Resp 15  Ht 5\' 8"  (1.727 m)  Wt 197 lb 6.4 oz (89.54 kg)  BMI 30.02 kg/m2  SpO2 92%  Discharge Labs:  Results  for orders placed during the hospital encounter of 03/11/13 (from the past 24 hour(s))  CBC WITH DIFFERENTIAL     Status: Abnormal   Collection Time    03/11/13  4:09 PM      Result Value Range   WBC 8.9  4.0 - 10.5 K/uL   RBC 5.98 (*) 4.22 - 5.81 MIL/uL   Hemoglobin 15.0  13.0 - 17.0 g/dL   HCT 16.1  09.6 - 04.5 %   MCV 75.4 (*) 78.0 - 100.0 fL   MCH 25.1 (*) 26.0 - 34.0 pg   MCHC 33.3  30.0 - 36.0 g/dL   RDW 40.9  81.1 - 91.4 %   Platelets 238  150 - 400 K/uL   Neutrophils Relative 75  43 - 77 %   Neutro Abs 6.6  1.7 - 7.7 K/uL   Lymphocytes Relative 16  12 - 46 %   Lymphs Abs 1.5  0.7 - 4.0 K/uL   Monocytes Relative 8  3 - 12 %   Monocytes Absolute 0.7  0.1 - 1.0 K/uL   Eosinophils Relative 1  0 - 5 %   Eosinophils Absolute 0.1  0.0 - 0.7 K/uL   Basophils Relative 0  0 - 1 %   Basophils Absolute 0.0  0.0 - 0.1 K/uL  COMPREHENSIVE METABOLIC PANEL     Status: Abnormal   Collection Time    03/11/13  4:09 PM      Result Value Range   Sodium 138  135 - 145 mEq/L   Potassium 3.0 (*) 3.5 - 5.1 mEq/L   Chloride 98  96 - 112 mEq/L   CO2 25  19 - 32 mEq/L   Glucose, Bld 136 (*) 70 - 99 mg/dL   BUN 12  6 - 23 mg/dL   Creatinine, Ser 7.82  0.50 - 1.35 mg/dL   Calcium 9.7  8.4 - 95.6 mg/dL   Total Protein 8.9 (*) 6.0 - 8.3 g/dL   Albumin 4.4  3.5 - 5.2 g/dL   AST 37  0 - 37 U/L   ALT 55 (*)  0 - 53 U/L   Alkaline Phosphatase 90  39 - 117 U/L   Total Bilirubin 0.4  0.3 - 1.2 mg/dL   GFR calc non Af Amer 81 (*) >90 mL/min   GFR calc Af Amer >90  >90 mL/min  URINALYSIS, ROUTINE W REFLEX MICROSCOPIC     Status: Abnormal   Collection Time    03/11/13  9:46 PM      Result Value Range   Color, Urine YELLOW  YELLOW   APPearance CLEAR  CLEAR   Specific Gravity, Urine 1.024  1.005 - 1.030   pH 5.0  5.0 - 8.0   Glucose, UA NEGATIVE  NEGATIVE mg/dL   Hgb urine dipstick NEGATIVE  NEGATIVE   Bilirubin Urine NEGATIVE  NEGATIVE   Ketones, ur 15 (*) NEGATIVE mg/dL   Protein, ur NEGATIVE  NEGATIVE mg/dL   Urobilinogen, UA 0.2  0.0 - 1.0 mg/dL   Nitrite NEGATIVE  NEGATIVE   Leukocytes, UA NEGATIVE  NEGATIVE  NA AND K (SODIUM & POTASSIUM), RAND UR     Status: None   Collection Time    03/11/13  9:46 PM      Result Value Range   Sodium, Ur 36     Potassium Urine Timed 131    CREATININE, URINE, RANDOM     Status: None   Collection Time    03/11/13  9:46 PM  Result Value Range   Creatinine, Urine 251.16    UREA NITROGEN, URINE     Status: None   Collection Time    03/11/13  9:47 PM      Result Value Range   Urea Nitrogen, Ur 937    BASIC METABOLIC PANEL     Status: Abnormal   Collection Time    03/12/13  5:30 AM      Result Value Range   Sodium 140  135 - 145 mEq/L   Potassium 3.1 (*) 3.5 - 5.1 mEq/L   Chloride 103  96 - 112 mEq/L   CO2 25  19 - 32 mEq/L   Glucose, Bld 136 (*) 70 - 99 mg/dL   BUN 13  6 - 23 mg/dL   Creatinine, Ser 4.09  0.50 - 1.35 mg/dL   Calcium 8.5  8.4 - 81.1 mg/dL   GFR calc non Af Amer 79 (*) >90 mL/min   GFR calc Af Amer >90  >90 mL/min  CBC     Status: Abnormal   Collection Time    03/12/13  5:30 AM      Result Value Range   WBC 9.6  4.0 - 10.5 K/uL   RBC 5.23  4.22 - 5.81 MIL/uL   Hemoglobin 12.9 (*) 13.0 - 17.0 g/dL   HCT 91.4  78.2 - 95.6 %   MCV 74.6 (*) 78.0 - 100.0 fL   MCH 24.7 (*) 26.0 - 34.0 pg   MCHC 33.1  30.0 - 36.0 g/dL    RDW 21.3  08.6 - 57.8 %   Platelets 192  150 - 400 K/uL  MAGNESIUM     Status: None   Collection Time    03/12/13  5:30 AM      Result Value Range   Magnesium 1.5  1.5 - 2.5 mg/dL    Signed: Genelle Gather 03/12/2013, 3:03 PM   Time Spent on Discharge: Services Ordered on Discharge: None Equipment Ordered on Discharge: None

## 2013-03-12 NOTE — Progress Notes (Signed)
Patient discharged to home with family.  Discharge teaching done with sister and mother, including follow up care, medications and diet. Verbalize understanding with no further questions.  Tolerating regular diet with no nausea, vital signs stable.

## 2013-03-12 NOTE — Progress Notes (Signed)
Subjective: Nausea, vomiting, and diarrhea have all resolved. Pt denies abdominal pain. He is tolerating clears.  Objective: Vital signs in last 24 hours: Filed Vitals:   03/12/13 0252 03/12/13 0256 03/12/13 0300 03/12/13 0706  BP: 109/65 114/65 124/83 120/69  Pulse: 116 125 135 104  Temp: 98.2 F (36.8 C)   98.1 F (36.7 C)  TempSrc: Oral   Oral  Resp: 24   18  Height:      Weight:      SpO2: 96% 96% 95% 95%   Weight change:  No intake or output data in the 24 hours ending 03/12/13 1056 Vitals reviewed. General: Sitting up on the side of the bed, NAD HEENT: PERRL, EOMI, no scleral icterus Cardiac: RRR, no rubs, murmurs or gallops Pulm: Clear to auscultation bilaterally, no wheezes, rales, or rhonchi Abd: Soft, nontender, nondistended Ext: warm and well perfused, no pedal edema Neuro: Alert, nonfocal  Lab Results: Basic Metabolic Panel:  Recent Labs Lab 03/11/13 1609 03/12/13 0530  NA 138 140  K 3.0* 3.1*  CL 98 103  CO2 25 25  GLUCOSE 136* 136*  BUN 12 13  CREATININE 1.02 1.04  CALCIUM 9.7 8.5  MG  --  1.5   Liver Function Tests:  Recent Labs Lab 03/11/13 1609  AST 37  ALT 55*  ALKPHOS 90  BILITOT 0.4  PROT 8.9*  ALBUMIN 4.4   CBC:  Recent Labs Lab 03/11/13 1609 03/12/13 0530  WBC 8.9 9.6  NEUTROABS 6.6  --   HGB 15.0 12.9*  HCT 45.1 39.0  MCV 75.4* 74.6*  PLT 238 192   Urinalysis:  Recent Labs Lab 03/11/13 2146  COLORURINE YELLOW  LABSPEC 1.024  PHURINE 5.0  GLUCOSEU NEGATIVE  HGBUR NEGATIVE  BILIRUBINUR NEGATIVE  KETONESUR 15*  PROTEINUR NEGATIVE  UROBILINOGEN 0.2  NITRITE NEGATIVE  LEUKOCYTESUR NEGATIVE    Studies/Results: Dg Chest Port 1 View  03/11/2013  *RADIOLOGY REPORT*  Clinical Data: Tracheal deviation. Shortness of breath.  PORTABLE CHEST - 1 VIEW  Comparison: Two-view chest x-ray 08/24/2004.  Findings: The heart size is normal.  Low lung volumes are evident. The patient is slightly rotated to the right.  There is  no significant tracheal deviation.  Bibasilar atelectasis is evident. Scoliosis is stable.  IMPRESSION:  1.  Low lung volumes and mild bibasilar atelectasis. 2.  No significant airspace consolidation. 3.  Stable levoconvex scoliosis.   Original Report Authenticated By: Marin Roberts, M.D.    Medications: I have reviewed the patient's current medications. Scheduled Meds: . sodium chloride   Intravenous STAT  . atenolol  25 mg Oral Daily  . fluphenazine  2.5 mg Oral Daily  . fluticasone  1 spray Each Nare Daily  . loratadine  10 mg Oral Daily  . pantoprazole  40 mg Oral Daily  . potassium chloride  40 mEq Oral Once  . sodium chloride  3 mL Intravenous Q12H  . triamterene-hydrochlorothiazide  1 each Oral Daily   Continuous Infusions: . sodium chloride 125 mL/hr at 03/12/13 0626   PRN Meds:.ondansetron (ZOFRAN) IV, ondansetron  Assessment/Plan: 57yo M with MR, deaf mutisim, HTN, and hypokalemia presents with acute onset of nausea, vomiting, and diarrhea.   1. Viral gastroenteritis: Resolved. Onset 48hrs prior to admission. Symptoms resolved after admission. Given sick contacts, this was likely a viral illness. Canceled C.diff, as symptoms have resolved and this is likely viral. He has been tolerating clears, so will advance his diet, and as long as he tolerates this, he can  be discharged.  - Regular diet - NS@125 , will d/c after lunch  2. Sinus tachycardia  -HR 134 on admission, 104 this morning. Likely secondary to dehydration, volume depletion due to GI losses with vomiting and diarrhea. He was bolused 1 L in ED. Orthostatics are normal. -Continuing IVF until tolerating regular diet  3. Hypokalemia K 3.0 on admission. Given 40 meQ in the ED. 3.1 this morning, given an additional KCl.  - Regular diet  4. DVT px  - SCDs. Mother of the patient declined Heparin or Lovenox    Dispo: Disposition is deferred at this time, awaiting improvement of current medical problems.   Anticipated discharge in approximately 0-1 day(s).   The patient does have a current PCP (PRIBULA,CHRISTOPHER, MD), therefore will be requiring OPC follow-up after discharge.   The patient does not have transportation limitations that hinder transportation to clinic appointments.  .Services Needed at time of discharge: Y = Yes, Blank = No PT:   OT:   RN:   Equipment:   Other:     LOS: 1 day   Genelle Gather 03/12/2013, 10:56 AM

## 2013-03-12 NOTE — H&P (Signed)
INTERNAL MEDICINE TEACHING SERVICE Attending Admission Note  Date: 03/12/2013  Patient name: Leonard Tucker  Medical record number: [id number]  Date of birth: [birth year]    I have seen and evaluated Leonard Tucker and discussed their care with the Residency Team.  57 yr. Old AAM w/ pmhx significant for Deaf-mutism, delayed mental development, paranoid schizophrenia, presented with nausea, vomiting, diarrhea.  He has a family member in the room who helped translated via sign language.  He states his mother (who also was admitted) and him were having similar symptoms over the past few days. He admits to decreased oral intake and a subjective fever. The vomiting was causing upper chest burning.  Today, he feels better. He denies CP, N/V, diarrhea. He was noted to be tachycardic on admission as well as hypokalemic. His HR improved with IVF volume resuscitation. He is eager to go home today and his family member states he has someone that can help him at home while his mother stays in the hospital.  Physical Exam: Blood pressure 120/69, pulse 104, temperature 98.1 F (36.7 C), temperature source Oral, resp. rate 18, height 5\' 8"  (1.727 m), weight 197 lb 6.4 oz (89.54 kg), SpO2 95.00%.  General: Vital signs reviewed and noted. Well-developed, well-nourished, in no acute distress; alert, appropriate and cooperative throughout examination.  Head: Normocephalic, atraumatic.  Eyes: PERRL, EOMI, No signs of anemia or jaundince.  Nose: Mucous membranes moist, not inflammed, nonerythematous.  Throat: Oropharynx nonerythematous, no exudate appreciated.   Neck: No deformities, masses, or tenderness noted.Supple, No carotid Bruits, no JVD.  Lungs:  Normal respiratory effort. Clear to auscultation BL without crackles or wheezes.  Heart: RRR. S1 and S2 normal without gallop, murmur, or rubs.  Abdomen:  BS normoactive. Soft, Nondistended, non-tender.  No masses or organomegaly.  Extremities: No pretibial edema.   Neurologic: A&O X3 (via interpreter), CN II - XII are grossly intact. Motor strength is 5/5 in the all 4 extremities, Sensations intact to light touch, Cerebellar signs negative.  Skin: No visible rashes, scars.    Lab results: Results for orders placed during the hospital encounter of 03/11/13 (from the past 24 hour(s))  CBC WITH DIFFERENTIAL     Status: Abnormal   Collection Time    03/11/13  4:09 PM      Result Value Range   WBC 8.9  4.0 - 10.5 K/uL   RBC 5.98 (*) 4.22 - 5.81 MIL/uL   Hemoglobin 15.0  13.0 - 17.0 g/dL   HCT 40.9  81.1 - 91.4 %   MCV 75.4 (*) 78.0 - 100.0 fL   MCH 25.1 (*) 26.0 - 34.0 pg   MCHC 33.3  30.0 - 36.0 g/dL   RDW 78.2  95.6 - 21.3 %   Platelets 238  150 - 400 K/uL   Neutrophils Relative 75  43 - 77 %   Neutro Abs 6.6  1.7 - 7.7 K/uL   Lymphocytes Relative 16  12 - 46 %   Lymphs Abs 1.5  0.7 - 4.0 K/uL   Monocytes Relative 8  3 - 12 %   Monocytes Absolute 0.7  0.1 - 1.0 K/uL   Eosinophils Relative 1  0 - 5 %   Eosinophils Absolute 0.1  0.0 - 0.7 K/uL   Basophils Relative 0  0 - 1 %   Basophils Absolute 0.0  0.0 - 0.1 K/uL  COMPREHENSIVE METABOLIC PANEL     Status: Abnormal   Collection Time    03/11/13  4:09  PM      Result Value Range   Sodium 138  135 - 145 mEq/L   Potassium 3.0 (*) 3.5 - 5.1 mEq/L   Chloride 98  96 - 112 mEq/L   CO2 25  19 - 32 mEq/L   Glucose, Bld 136 (*) 70 - 99 mg/dL   BUN 12  6 - 23 mg/dL   Creatinine, Ser 1.61  0.50 - 1.35 mg/dL   Calcium 9.7  8.4 - 09.6 mg/dL   Total Protein 8.9 (*) 6.0 - 8.3 g/dL   Albumin 4.4  3.5 - 5.2 g/dL   AST 37  0 - 37 U/L   ALT 55 (*) 0 - 53 U/L   Alkaline Phosphatase 90  39 - 117 U/L   Total Bilirubin 0.4  0.3 - 1.2 mg/dL   GFR calc non Af Amer 81 (*) >90 mL/min   GFR calc Af Amer >90  >90 mL/min  URINALYSIS, ROUTINE W REFLEX MICROSCOPIC     Status: Abnormal   Collection Time    03/11/13  9:46 PM      Result Value Range   Color, Urine YELLOW  YELLOW   APPearance CLEAR  CLEAR    Specific Gravity, Urine 1.024  1.005 - 1.030   pH 5.0  5.0 - 8.0   Glucose, UA NEGATIVE  NEGATIVE mg/dL   Hgb urine dipstick NEGATIVE  NEGATIVE   Bilirubin Urine NEGATIVE  NEGATIVE   Ketones, ur 15 (*) NEGATIVE mg/dL   Protein, ur NEGATIVE  NEGATIVE mg/dL   Urobilinogen, UA 0.2  0.0 - 1.0 mg/dL   Nitrite NEGATIVE  NEGATIVE   Leukocytes, UA NEGATIVE  NEGATIVE  NA AND K (SODIUM & POTASSIUM), RAND UR     Status: None   Collection Time    03/11/13  9:46 PM      Result Value Range   Sodium, Ur 36     Potassium Urine Timed 131    CREATININE, URINE, RANDOM     Status: None   Collection Time    03/11/13  9:46 PM      Result Value Range   Creatinine, Urine 251.16    UREA NITROGEN, URINE     Status: None   Collection Time    03/11/13  9:47 PM      Result Value Range   Urea Nitrogen, Ur 937    BASIC METABOLIC PANEL     Status: Abnormal   Collection Time    03/12/13  5:30 AM      Result Value Range   Sodium 140  135 - 145 mEq/L   Potassium 3.1 (*) 3.5 - 5.1 mEq/L   Chloride 103  96 - 112 mEq/L   CO2 25  19 - 32 mEq/L   Glucose, Bld 136 (*) 70 - 99 mg/dL   BUN 13  6 - 23 mg/dL   Creatinine, Ser 0.45  0.50 - 1.35 mg/dL   Calcium 8.5  8.4 - 40.9 mg/dL   GFR calc non Af Amer 79 (*) >90 mL/min   GFR calc Af Amer >90  >90 mL/min  CBC     Status: Abnormal   Collection Time    03/12/13  5:30 AM      Result Value Range   WBC 9.6  4.0 - 10.5 K/uL   RBC 5.23  4.22 - 5.81 MIL/uL   Hemoglobin 12.9 (*) 13.0 - 17.0 g/dL   HCT 81.1  91.4 - 78.2 %   MCV 74.6 (*)  78.0 - 100.0 fL   MCH 24.7 (*) 26.0 - 34.0 pg   MCHC 33.1  30.0 - 36.0 g/dL   RDW 16.1  09.6 - 04.5 %   Platelets 192  150 - 400 K/uL  MAGNESIUM     Status: None   Collection Time    03/12/13  5:30 AM      Result Value Range   Magnesium 1.5  1.5 - 2.5 mg/dL    Imaging results:  Dg Chest Port 1 View  03/11/2013  *RADIOLOGY REPORT*  Clinical Data: Tracheal deviation. Shortness of breath.  PORTABLE CHEST - 1 VIEW  Comparison:  Two-view chest x-ray 08/24/2004.  Findings: The heart size is normal.  Low lung volumes are evident. The patient is slightly rotated to the right.  There is no significant tracheal deviation.  Bibasilar atelectasis is evident. Scoliosis is stable.  IMPRESSION:  1.  Low lung volumes and mild bibasilar atelectasis. 2.  No significant airspace consolidation. 3.  Stable levoconvex scoliosis.   Original Report Authenticated By: Marin Roberts, M.D.      Assessment and Plan: I agree with the formulated Assessment and Plan with the following changes: 57 yr. Old AAM w/ pmhx significant for Deaf-mutism, delayed mental development, paranoid schizophrenia, presented with nausea, vomiting, diarrhea, with viral gastroenteritis. 1) Viral gastroenteritis: Replace K, continue IVF for now. His HR is improving and he looks euvolemic. If he tolerates his lunch and is able to tolerate liquids, he can be discharged home. He has help available at home and is clinically stable today. I do not think this is C diff and this test can be discontinued as he is not having diarrhea.     The treatment plan was discussed in detail with the patient.  Alternatives to treatment, side effects, risks and benefits, and complications were discussed with the patient. Informed consent was obtained. The patient agrees to proceed with the current treatment plan.  Jonah Blue, [place] 4/11/20141:44 PM

## 2013-03-15 NOTE — Discharge Summary (Signed)
INTERNAL MEDICINE TEACHING SERVICE Attending Note  Date: 03/15/2013  Patient name: Leonard Tucker  Medical record number: [id number]  Date of birth: 02/11/1956   This patient has been  discussed with the house staff. Please see their note for complete details. I concur with their findings and plan.  The treatment plan was discussed in detail with the patient.  Alternatives to treatment, side effects, risks and benefits, and complications were discussed with the patient. Informed consent was obtained. The patient agrees to proceed with the current treatment plan.   Jonah Blue, DO  03/15/2013, 11:08 AM

## 2013-03-24 ENCOUNTER — Encounter: Payer: Self-pay | Admitting: Internal Medicine

## 2013-03-24 ENCOUNTER — Telehealth: Payer: Self-pay | Admitting: Internal Medicine

## 2013-03-24 ENCOUNTER — Ambulatory Visit (INDEPENDENT_AMBULATORY_CARE_PROVIDER_SITE_OTHER): Payer: Medicare Other | Admitting: Internal Medicine

## 2013-03-24 VITALS — BP 101/69 | HR 68 | Temp 98.2°F | Wt 199.6 lb

## 2013-03-24 DIAGNOSIS — E876 Hypokalemia: Secondary | ICD-10-CM | POA: Diagnosis not present

## 2013-03-24 DIAGNOSIS — J309 Allergic rhinitis, unspecified: Secondary | ICD-10-CM

## 2013-03-24 DIAGNOSIS — K5289 Other specified noninfective gastroenteritis and colitis: Secondary | ICD-10-CM

## 2013-03-24 DIAGNOSIS — K529 Noninfective gastroenteritis and colitis, unspecified: Secondary | ICD-10-CM

## 2013-03-24 LAB — BASIC METABOLIC PANEL WITH GFR
GFR, Est African American: >89 mL/min
GFR, Est Non African American: 88 mL/min
Glucose, Bld: 81 mg/dL (ref 70–99)
Potassium: 3.8 mEq/L (ref 3.5–5.3)
Sodium: 139 mEq/L (ref 135–145)

## 2013-03-24 MED ORDER — FLUTICASONE PROPIONATE 50 MCG/ACT NA SUSP: 1.0000 | Freq: Every day | NASAL | Status: DC

## 2013-03-24 NOTE — Telephone Encounter (Signed)
I received a call from [facility] lab at 8:42 PM, and was told that patient's BMP ordered by Dr. Josem Kaufmann today is normal.  Recent Labs Lab 03/24/13 1446  NA 139  K 3.8  CL 101  CO2 29  GLUCOSE 81  BUN 11  CREATININE 0.97  CALCIUM 9.7   Lorretta Harp, MD PGY2, Internal Medicine Teaching Service Pager: [phone]

## 2013-03-24 NOTE — Assessment & Plan Note (Signed)
Resolved

## 2013-03-24 NOTE — Addendum Note (Signed)
Addended by: Remus Blake on: 03/24/2013 02:44 PM   Modules accepted: Orders

## 2013-03-24 NOTE — Progress Notes (Signed)
Patient ID: Leonard Tucker, male   DOB: [birth year], 57 y.o.   MRN: [id number]  Subjective:   Patient ID: Leonard Tucker male   DOB: 1956-04-03 57 y.o.   MRN: [id number]  HPI: [name] is a 57 y.o. with mental retardation and hypertension who presents to the clinic for a hospital f/u after being admitted with gastroenteritis.  He and his mother, his primary care giver, were admitted to [facility] with a 1 day h/o N/V/diarrhea, consistent with viral gastroenteritis. Due to his inability to tolerate PO and concern about the patient's ability to care for himself at home, the he was admitted and given IV fluids. His symptoms resolved that evening, and he was discharged home with one of his sisters the next day. Since his discharge, his mother reports that he did have 2-3 loose stools, immediately after discharge, but none since.  During this hospitalization, he was noted to be hypokalemic, possibly worsened 2/2 to his vomiting and diarrhea. His KCl was increased to daily from .   Past Medical History  Diagnosis Date  . Deaf-mutism   . MR (mental retardation)   . Schizophrenia, paranoid, chronic   . Chronic constipation   . Hypertriglyceridemia   . Dental caries   . Intertrigo   . Umbilical hernia   . History of iron deficiency anemia    Current Outpatient Prescriptions  Medication Sig Dispense Refill  . atenolol (TENORMIN) 25 MG tablet Take 1 tablet (25 mg total) by mouth daily.  30 tablet  6  . cetirizine (ZYRTEC) 10 MG tablet Take 1 tablet (10 mg total) by mouth daily.  30 tablet  6  . ferrous sulfate 325 (65 FE) MG tablet Take 1 tablet (325 mg total) by mouth 3 (three) times daily with meals.  90 tablet  3  . fluPHENAZine decanoate (PROLIXIN) 25 MG/ML injection Inject 32.5 mg into the muscle every 21 ( twenty-one) days. Next dose due 03/16/2013      . mometasone (NASONEX) 50 MCG/ACT nasal spray Place 2 sprays into the nose daily.  17 g  3  . omeprazole (PRILOSEC) 20 MG  capsule TAKE ONE CAPSULE BY MOUTH EVERY DAY  30 capsule  5  . polyethylene glycol powder (MIRALAX) powder Take 17 g by mouth daily.  255 g  0  . potassium chloride (K-DUR) 10 MEQ tablet Take 2 tablets (20 mEq total) by mouth daily. Start taking 1 tablet by mouth daily  30 tablet  6  . Psyllium (EQ FIBER THERAPY) 48.57 % POWD Take 15 g by mouth daily.  1254 g  3  . triamterene-hydrochlorothiazide (DYAZIDE) 37.5-25 MG per capsule Take 1 each (1 capsule total) by mouth daily.  30 capsule  5   No current facility-administered medications for this visit.   Family History  Problem Relation Age of Onset  . Diabetes Maternal Aunt   . Hypertension Mother    History   Social History  . Marital Status: Single    Spouse Name: N/A    Number of Children: N/A  . Years of Education: N/A   Social History Main Topics  . Smoking status: Never Smoker   . Smokeless tobacco: Not on file  . Alcohol Use: No  . Drug Use: No  . Sexually Active: Not on file   Other Topics Concern  . Not on file   Social History Narrative   His mother, Leonard Tucker is his primary caretaker and he lives with her.  Communicates via sign language          Review of Systems: A 10 point ROS was performed; pertinent positives and negatives were noted in the HPI as per the patient's care giver.   Objective:  Physical Exam: There were no vitals filed for this visit. Constitutional: Vital signs reviewed.  Patient is a well-developed and well-nourished male in no acute distress and cooperative with exam.  Head: Normocephalic and atraumatic Eyes: PERRL, EOMI, conjunctivae normal, No scleral icterus.   Cardiovascular: RRR, S1 normal, S2 normal, no MRG, pulses symmetric and intact bilaterally Pulmonary/Chest: CTAB, no wheezes, rales, or rhonchi Abdominal: Soft. Non-tender, non-distended, no guarding present.  Musculoskeletal: No joint deformities Neurological: A&O x3, nonfocal Skin: Warm, dry and intact. No rash,  cyanosis, or clubbing.   Assessment & Plan:   Please refer to Problem List based Assessment and Plan

## 2013-03-24 NOTE — Assessment & Plan Note (Signed)
Will continue Flonase, which works well, per mother.

## 2013-03-24 NOTE — Assessment & Plan Note (Addendum)
Potassium was 3.1 on 03/12/13,a dn his KCl was increased to daily. Will continue the for now, and recheck potassium today.   With his hypokalemia and HTN, possibility for primary hypoaldosteronism. Will check serum aldosterone and aldosterone/renin levels today to r/o.

## 2013-03-27 NOTE — Progress Notes (Signed)
Case discussed with Dr. Sherrine Maples at the time of the visit. We reviewed the resident's history and exam and pertinent patient test results. I agree with the assessment, diagnosis and plan of care documented in the resident's note.  His potassium increased to 3.8 with increased KCl supplementation.  Interestingly, the combination of hypokalemia and hypertension could be consistent with primary hyperaldosteronism.  We will check an aldosterone and a plasma renin activity level to assess for hyperaldosteronism as a cause of his hypertension.

## 2013-03-31 LAB — ALDOSTERONE + RENIN ACTIVITY W/ RATIO
ALDO / PRA Ratio: 5.4 Ratio (ref 0.9–28.9)
Aldosterone: 16 ng/dL

## 2013-04-02 ENCOUNTER — Encounter: Payer: Self-pay | Admitting: Internal Medicine

## 2013-04-02 ENCOUNTER — Ambulatory Visit (INDEPENDENT_AMBULATORY_CARE_PROVIDER_SITE_OTHER): Payer: Medicare Other | Admitting: Internal Medicine

## 2013-04-02 VITALS — BP 105/73 | HR 66 | Temp 97.2°F | Ht 67.0 in | Wt 197.8 lb

## 2013-04-02 DIAGNOSIS — J309 Allergic rhinitis, unspecified: Secondary | ICD-10-CM | POA: Diagnosis not present

## 2013-04-02 DIAGNOSIS — K5900 Constipation, unspecified: Secondary | ICD-10-CM | POA: Diagnosis not present

## 2013-04-02 DIAGNOSIS — E876 Hypokalemia: Secondary | ICD-10-CM | POA: Diagnosis not present

## 2013-04-02 DIAGNOSIS — I10 Essential (primary) hypertension: Secondary | ICD-10-CM

## 2013-04-02 DIAGNOSIS — D500 Iron deficiency anemia secondary to blood loss (chronic): Secondary | ICD-10-CM | POA: Diagnosis not present

## 2013-04-02 MED ORDER — CETIRIZINE HCL 10 MG PO TABS: 10.0000 mg | ORAL_TABLET | Freq: Every day | ORAL | Status: DC

## 2013-04-02 MED ORDER — POLYETHYLENE GLYCOL 3350 17 GM/SCOOP PO POWD: 17.0000 g | Freq: Every day | ORAL | Status: DC

## 2013-04-02 MED ORDER — FERROUS SULFATE 325 (65 FE) MG PO TABS: 325.0000 mg | ORAL_TABLET | Freq: Three times a day (TID) | ORAL | Status: DC

## 2013-04-02 NOTE — Patient Instructions (Signed)
1.  Continue your medications as prescribed.  2.  Use the Potassium tablets 1 tablet daily.  3.  Follow up with your new doctor in July or August  4. It has been an absolute pleasure caring for you and your Mom over the last 3 years.  I wish you all the best!

## 2013-04-02 NOTE — Progress Notes (Signed)
Subjective:   Patient ID: Leonard Tucker male   DOB: 06-05-1956 57 y.o.   MRN: [id number]  HPI: [name] is a 58 y.o. man who presents to clinic today for follow up from his last appointment.  His mother is present as always.  She states that he has been doing well and needs refills on his medications.   She states that when he is in the hospital he was noted to be hypokalemic and that his potassium supplementation was increased from 10 mEq to 20 mEq daily.  She states that when he has been bumped to 2 tablets daily his behavior worsens and she put him back down to one tablet.   Past Medical History  Diagnosis Date  . Deaf-mutism   . MR (mental retardation)   . Schizophrenia, paranoid, chronic   . Chronic constipation   . Hypertriglyceridemia   . Dental caries   . Intertrigo   . Umbilical hernia   . History of iron deficiency anemia    Current Outpatient Prescriptions  Medication Sig Dispense Refill  . atenolol (TENORMIN) 25 MG tablet Take 1 tablet (25 mg total) by mouth daily.  30 tablet  6  . cetirizine (ZYRTEC) 10 MG tablet Take 1 tablet (10 mg total) by mouth daily.  30 tablet  6  . ferrous sulfate 325 (65 FE) MG tablet Take 1 tablet (325 mg total) by mouth 3 (three) times daily with meals.  90 tablet  3  . fluPHENAZine decanoate (PROLIXIN) 25 MG/ML injection Inject 32.5 mg into the muscle every 21 ( twenty-one) days. Next dose due 03/16/2013      . fluticasone (FLONASE) 50 MCG/ACT nasal spray Place 1 spray into the nose daily.  16 g  6  . omeprazole (PRILOSEC) 20 MG capsule TAKE ONE CAPSULE BY MOUTH EVERY DAY  30 capsule  5  . polyethylene glycol powder (MIRALAX) powder Take 17 g by mouth daily.  255 g  0  . potassium chloride (K-DUR) 10 MEQ tablet Take 2 tablets (20 mEq total) by mouth daily. Start taking 1 tablet by mouth daily  30 tablet  6  . Psyllium (EQ FIBER THERAPY) 48.57 % POWD Take 15 g by mouth daily.  1254 g  3  . triamterene-hydrochlorothiazide (DYAZIDE)  37.5-25 MG per capsule Take 1 each (1 capsule total) by mouth daily.  30 capsule  5   No current facility-administered medications for this visit.   Family History  Problem Relation Age of Onset  . Diabetes Maternal Aunt   . Hypertension Mother    History   Social History  . Marital Status: Single    Spouse Name: N/A    Number of Children: N/A  . Years of Education: N/A   Social History Main Topics  . Smoking status: Never Smoker   . Smokeless tobacco: None  . Alcohol Use: No  . Drug Use: No  . Sexually Active: None   Other Topics Concern  . None   Social History Narrative   His mother, Leonard Tucker is his primary caretaker and he lives with her.    Communicates via sign language          Review of Systems: A full review of systems unable to be obtained due to patients existing mental retardation.  He specifically denies pain, nausea, vomiting, diarrhea, weakness, or shortness of breath.   Objective:  Physical Exam: Filed Vitals:   04/02/13 1346  BP: 105/73  Pulse: 66  Temp: 97.2  F (36.2 C)  TempSrc: Oral  Height: 5\' 7"  (1.702 m)  Weight: 197 lb 12.8 oz (89.721 kg)  SpO2: 97%   Constitutional: Vital signs reviewed.  Patient is a well-developed and well-nourished deaf and mute man in no acute distress and cooperative with exam. Alert and oriented x3. His mother serves as a Nurse, learning disability to [employer].  Head: Normocephalic and atraumatic Ear: TM normal bilaterally Mouth: no erythema or exudates, MMM Eyes: PERRL, EOMI, conjunctivae normal, No scleral icterus.  Neck: Supple, Trachea midline normal ROM, No JVD, mass, thyromegaly, or carotid bruit present.  Cardiovascular: RRR, S1 normal, S2 normal, no MRG, pulses symmetric and intact bilaterally Pulmonary/Chest: CTAB, no wheezes, rales, or rhonchi Abdominal: Soft. Non-tender, non-distended, bowel sounds are normal, no masses, organomegaly, or guarding present.  Musculoskeletal: No joint deformities, erythema, or stiffness,  ROM full and no nontender Hematology: no cervical, inginal, or axillary adenopathy.  Neurological: A&O x3, Strength is normal and symmetric bilaterally, cranial nerve II-XII are grossly intact, no focal motor deficit, sensory intact to light touch bilaterally.  Skin: Warm, dry and intact. No rash, cyanosis, or clubbing.  Psychiatric: Normal mood and jovial affect. speech is absent.  Behavior is normal. Judgment and insight appear intact. Thought content appears normal. Cognition is congruent.  Memory are normal.   Assessment & Plan:

## 2013-04-05 ENCOUNTER — Other Ambulatory Visit: Payer: Self-pay | Admitting: Internal Medicine

## 2013-04-05 DIAGNOSIS — J309 Allergic rhinitis, unspecified: Secondary | ICD-10-CM

## 2013-04-06 MED ORDER — CETIRIZINE HCL 10 MG PO TABS: 10.0000 mg | ORAL_TABLET | Freq: Every day | ORAL | Status: DC

## 2013-04-14 DIAGNOSIS — F200 Paranoid schizophrenia: Secondary | ICD-10-CM | POA: Diagnosis not present

## 2013-04-19 NOTE — Assessment & Plan Note (Signed)
He has problems with chronic constipation and uses miralax once or twice daily.  His constipation has caused hemorrhoids which are treated with witch hazel wipes and OTC hemorrhoid cream.

## 2013-04-19 NOTE — Assessment & Plan Note (Signed)
He has occasional problems with allergic rhinitis and uses nasal steroids for it.  He needs a refill today with the upcoming worsening of allergy season in [place].

## 2013-04-19 NOTE — Assessment & Plan Note (Signed)
Going as far back in the records as we have available (paper records included) he has been mildly hypokalemia ever since he was started in HCTZ in 1999.  Even with the addition of Triamterene his potassium has always ranged between 3.1 and 3.4 on 10 mEq daily.  When we have tried to increase his potassium supplementation to 2 tablet daily his behavior has changed and made him more difficult to care for by his mother.  During his hospitalization his potassium was noted to be 3.0.  We will continue with 10 mEq daily supplementation and continue to monitor since his last potassium at his HFU was 3.8.

## 2013-04-19 NOTE — Assessment & Plan Note (Signed)
He has chronic iron deficiency from chronic GI blood loss from hemorrhoids.  We have discussed colon cancer screening to be more complete and this would be very difficult, albeit not impossible, given the likely need for hospitalization for prep and recovery following the procedure.  With his chronic hemorrhoids his stool cards will undoubtedly be positive.  We will have his mom turn them in anyway and then we will proceed from there as to how to arrange follow up.

## 2013-04-19 NOTE — Assessment & Plan Note (Signed)
BP Readings from Last 3 Encounters:  04/02/13 105/73  03/24/13 101/69  03/12/13 119/69    Lab Results  Component Value Date   NA 139 03/24/2013   K 3.8 03/24/2013   CREATININE 0.97 03/24/2013    Assessment: Blood pressure control: controlled Progress toward BP goal:  at goal Comments: Blood pressure is well controlled today on his atenolol 25 mg daily and Dyazide 1 capsule daily.    Plan: Medications:  continue current medications Educational resources provided: brochure;handout;video Self management tools provided:   Other plans: We will continue to monitor his BP.  See problems hypokalemia for discussion there.

## 2013-04-20 NOTE — Progress Notes (Signed)
Case discussed with Dr. Pribula soon after the resident saw the patient.  We reviewed the resident's history and exam and pertinent patient test results.  I agree with the assessment, diagnosis and plan of care documented in the resident's note. 

## 2013-05-30 NOTE — Assessment & Plan Note (Signed)
He has a long standing history of hypokalemia.  This is likely secondary to his HCTZ and even after the addition of triamterene he still normally runs between 3.3 and 3.8 for his potassium.  He has been on two pills daily for his potassium but his behavior worsens per his mother's report and improves after dropping the potassium supplement to 1 tablet daily.  He is otherwise asymptomatic from his hypokalemia.  We will continue to monitor his potassium ocassionally.

## 2013-05-30 NOTE — Assessment & Plan Note (Signed)
His mother helps him with medications and she states that he has been compliant with his meds.    BP Readings from Last 8 Encounters:  02/10/13 128/78  09/18/12 116/76  08/14/12 107/70   Blood pressure is well controlled today.  We will continue to monitor on his current medication.

## 2013-05-30 NOTE — Assessment & Plan Note (Signed)
Logistically it would be very difficult to get a colonoscopy on Yazid because of his disability and likely need for hospitalization for the prep and observation after the procedure.  We will start with FOBT cards x3 and if they are negative we will be satisfied with that for one year.

## 2013-05-30 NOTE — Assessment & Plan Note (Signed)
He has a history of hypertriglyceridemia and is maintained on gemfibrozil.   Lab Results  Component Value Date   CHOL 190 08/14/2012   CHOL 185 06/28/2011   CHOL 178 03/20/2010   Lab Results  Component Value Date   HDL 36* 08/14/2012   HDL 32* 06/28/2011   HDL 45 03/20/2010   Lab Results  Component Value Date   LDLCALC 92 08/14/2012   LDLCALC 87 06/28/2011   LDLCALC 87 03/20/2010   Lab Results  Component Value Date   TRIG 312* 08/14/2012   TRIG 332* 06/28/2011   TRIG 232* 03/20/2010   Lab Results  Component Value Date   CHOLHDL 5.3 08/14/2012   CHOLHDL 5.8 06/28/2011   CHOLHDL 4.0 Ratio 03/20/2010   No results found for this basename: LDLDIRECT   His triglycerides are under 400.  We will continue to monitor and refill his medication today.

## 2013-05-30 NOTE — Assessment & Plan Note (Signed)
He is maintained on PRolixin injections every 2 weeks.  His mother is his sole caregiver and she is wondering if there is anything that they qualify for that would include an adult daycare for respite care for her.  She states that he occasionally acts out but overall he is doing well.  We will refer her to social work to see if there are any other resources we can bring to bear to help her out.

## 2013-05-30 NOTE — Assessment & Plan Note (Signed)
Miralax has helped immensely per his mother.  She states that since starting it he has not complained about pain with defecation or noticed blood on the stool.  We will continue his miralax as needed for his constipation.

## 2013-05-30 NOTE — Assessment & Plan Note (Signed)
He has had dry cough for the last few days,  No production from the cough and his mother denies fevers, chills, nausea, vomiting, or diarrhea.  She states that he does say that his eyes itch.     We will start a nasal steroid for his allergic rhinitis.

## 2013-05-30 NOTE — Assessment & Plan Note (Signed)
See the assessment and plan for Schizophrenia for details.

## 2013-05-30 NOTE — Assessment & Plan Note (Signed)
He is maintained on atenolol and dyazide.  His mother helps him with his medications and states that he has been taking them.  BP Readings from Last 8 Encounters:  08/14/12 107/70  04/27/12 134/85  02/07/12 118/80   Blood pressure is well controlled today.  We will continue his medications as directed for now.

## 2013-07-16 ENCOUNTER — Other Ambulatory Visit: Payer: Self-pay | Admitting: *Deleted

## 2013-07-16 DIAGNOSIS — E876 Hypokalemia: Secondary | ICD-10-CM

## 2013-07-16 NOTE — Telephone Encounter (Signed)
Request 90 day supply

## 2013-07-20 MED ORDER — POTASSIUM CHLORIDE ER 10 MEQ PO TBCR: 10.0000 meq | EXTENDED_RELEASE_TABLET | Freq: Every day | ORAL | Status: DC

## 2013-07-20 MED ORDER — ATENOLOL 25 MG PO TABS: 25.0000 mg | ORAL_TABLET | Freq: Every day | ORAL | Status: DC

## 2013-07-20 MED ORDER — TRIAMTERENE-HCTZ 37.5-25 MG PO CAPS: 1.0000 | ORAL_CAPSULE | Freq: Every day | ORAL | Status: DC

## 2013-07-20 NOTE — Telephone Encounter (Signed)
3rd request per pharmacy.

## 2013-07-20 NOTE — Telephone Encounter (Signed)
Pls sch CC appt with PCP in Sep. If no CC appts available, long [facility] slot with Dr Andrey Campanile

## 2013-07-20 NOTE — Telephone Encounter (Signed)
Message sent to front office to schedule pt an appt. 

## 2013-07-27 ENCOUNTER — Encounter: Payer: Self-pay | Admitting: Internal Medicine

## 2013-07-27 NOTE — Progress Notes (Signed)
This encounter was created in error - please disregard.

## 2013-08-11 ENCOUNTER — Ambulatory Visit (INDEPENDENT_AMBULATORY_CARE_PROVIDER_SITE_OTHER): Payer: Medicare Other | Admitting: Internal Medicine

## 2013-08-11 ENCOUNTER — Encounter: Payer: Self-pay | Admitting: Internal Medicine

## 2013-08-11 VITALS — BP 111/75 | HR 68 | Temp 98.0°F | Ht 67.9 in | Wt 198.7 lb

## 2013-08-11 DIAGNOSIS — Z23 Encounter for immunization: Secondary | ICD-10-CM | POA: Diagnosis not present

## 2013-08-11 DIAGNOSIS — E876 Hypokalemia: Secondary | ICD-10-CM | POA: Diagnosis not present

## 2013-08-11 DIAGNOSIS — J309 Allergic rhinitis, unspecified: Secondary | ICD-10-CM | POA: Diagnosis not present

## 2013-08-11 DIAGNOSIS — E781 Pure hyperglyceridemia: Secondary | ICD-10-CM | POA: Diagnosis not present

## 2013-08-11 DIAGNOSIS — K5900 Constipation, unspecified: Secondary | ICD-10-CM | POA: Diagnosis not present

## 2013-08-11 DIAGNOSIS — I10 Essential (primary) hypertension: Secondary | ICD-10-CM

## 2013-08-11 DIAGNOSIS — D500 Iron deficiency anemia secondary to blood loss (chronic): Secondary | ICD-10-CM | POA: Diagnosis not present

## 2013-08-11 LAB — CBC WITH DIFFERENTIAL/PLATELET
Eosinophils Relative: 2 % (ref 0–5)
HCT: 39 % (ref 39.0–52.0)
Hemoglobin: 12.9 g/dL — ABNORMAL LOW (ref 13.0–17.0)
Lymphocytes Relative: 39 % (ref 12–46)
Lymphs Abs: 2.3 10*3/uL (ref 0.7–4.0)
MCV: 75.1 fL — ABNORMAL LOW (ref 78.0–100.0)
Monocytes Absolute: 0.7 10*3/uL (ref 0.1–1.0)
Monocytes Relative: 12 % (ref 3–12)
RBC: 5.19 MIL/uL (ref 4.22–5.81)
WBC: 5.9 10*3/uL (ref 4.0–10.5)

## 2013-08-11 LAB — BASIC METABOLIC PANEL WITH GFR
BUN: 8 mg/dL (ref 6–23)
CO2: 29 mEq/L (ref 19–32)
Chloride: 102 mEq/L (ref 96–112)
Creat: 0.9 mg/dL (ref 0.50–1.35)

## 2013-08-11 LAB — LIPID PANEL
Cholesterol: 164 mg/dL (ref 0–200)
Total CHOL/HDL Ratio: 5 Ratio
VLDL: 75 mg/dL — ABNORMAL HIGH (ref 0–40)

## 2013-08-11 MED ORDER — TUCKS 50 % EX PADS: 1.0000 "application " | MEDICATED_PAD | Freq: Three times a day (TID) | CUTANEOUS | Status: DC | PRN

## 2013-08-11 MED ORDER — POLYETHYLENE GLYCOL 3350 17 GM/SCOOP PO POWD: 17.0000 g | Freq: Every day | ORAL | Status: DC

## 2013-08-11 MED ORDER — CETIRIZINE HCL 10 MG PO TABS: 10.0000 mg | ORAL_TABLET | Freq: Every day | ORAL | Status: DC

## 2013-08-11 MED ORDER — OMEPRAZOLE 20 MG PO CPDR: DELAYED_RELEASE_CAPSULE | ORAL | Status: DC

## 2013-08-11 MED ORDER — FERROUS SULFATE 325 (65 FE) MG PO TABS: 325.0000 mg | ORAL_TABLET | Freq: Three times a day (TID) | ORAL | Status: DC

## 2013-08-11 NOTE — Progress Notes (Signed)
  Subjective:    Patient ID: Leonard Tucker, male    DOB: 09-19-1956, 57 y.o.   MRN: [id number]  HPI Comments: Leonard Tucker is a 57 year old male with a PMH of MR, deaf mutism, HTN, hyptriglyceridemia, hypokalemia, hemorrhoids and anemia.  He presents with his mother for health maintenance exam and medication refill.  His mother reports that he has been doing well and has no new complaints today.  He continues to experience occasional constipation but she says Miralax helps.  He has a history of hemorrhoids and she sometimes sees a small amount of blood in the stool.  He has not had a colonoscopy.  He has not complained of pain, CP/SOB.  His appetite is good.      Review of Systems  Unable to perform ROS: Patient nonverbal       Objective:   Physical Exam  Constitutional: He appears well-developed and well-nourished. No distress.  HENT:  Mouth/Throat: Oropharynx is clear and moist. No oropharyngeal exudate.  Eyes: Pupils are equal, round, and reactive to light.  Cardiovascular: Normal rate, regular rhythm and normal heart sounds.   No murmur heard. Pulmonary/Chest: Effort normal and breath sounds normal. No respiratory distress. He has no wheezes. He has no rales.  Abdominal: Soft. Bowel sounds are normal. He exhibits no distension. There is no tenderness.  Musculoskeletal: He exhibits no edema.  Skin: Skin is warm. He is not diaphoretic.  Psychiatric: He has a normal mood and affect. His behavior is normal.          Assessment & Plan:  Please see problem based assessment and plan.

## 2013-08-11 NOTE — Assessment & Plan Note (Signed)
Will continue Miralax since his mother reports that it does help.

## 2013-08-11 NOTE — Assessment & Plan Note (Signed)
Assessment:  K 3.1 in April 2014.  Patient compliant with K-Dur daily.  Plan:  Check BMP today.

## 2013-08-11 NOTE — Patient Instructions (Addendum)
General Instructions: 1. Please return to clinic in 3-4 months for follow-up.  2. Please take all medications as prescribed.   3. If you have worsening of your symptoms or new symptoms arise, please call the clinic ([phone]), or go to the ER immediately if symptoms are severe.   Treatment Goals:  Goals (1 Years of Data) as of 08/11/13   None      Progress Toward Treatment Goals:  Treatment Goal 08/11/2013  Blood pressure at goal    Self Care Goals & Plans:  Self Care Goal 08/11/2013  Manage my medications take my medicines as prescribed; bring my medications to every visit; refill my medications on time  Eat healthy foods drink diet soda or water instead of juice or soda; eat more vegetables; eat foods that are low in salt; eat baked foods instead of fried foods  Be physically active -  Meeting treatment goals maintain the current self-care plan      Care Management & Community Referrals:  Referral 04/02/2013  Referrals made for care management support none needed  Referrals made to community resources -    Witch Hazel topical solution wipes What is this medicine? WITCH HAZEL [facility] hey zuhl) is a botanical astringent from the plant [facility]. The wipes and pads are used to relieve itching, burning, and irritation caused by hemorrhoids or bowel movements. They may also be used to clean the outer vaginal area after childbirth or the rectal area following rectal surgery. This medicine may be used for other purposes; ask your health care provider or pharmacist if you have questions. What should I tell my health care provider before I take this medicine? They need to know if you have any of these conditions: -bleeding in the treated area -an unusual or allergic reaction to witch hazel, other medicines, foods, dyes, or preservatives How should I use this medicine? This medicine is for external use only. Follow the directions on the package label or the advice of your  doctor or health care professional. Do not use your medicine more often than directed. Talk to your pediatrician regarding the use of this medicine in children. While this drug may be used for children as young as 12 years for selected conditions, precautions do apply. Overdosage: If you think you've taken too much of this medicine contact a poison control center or emergency room at once. Overdosage: If you think you have taken too much of this medicine contact a poison control center or emergency room at once. NOTE: This medicine is only for you. Do not share this medicine with others. What if I miss a dose? This does not apply; the pads or wipes may be used as needed, up to 6 times per day. What may interact with this medicine? Interactions are not expected. Do not use any other skin products on the same area of skin without asking your doctor or health care professional. This list may not describe all possible interactions. Give your health care provider a list of all the medicines, herbs, non-prescription drugs, or dietary supplements you use. Also tell them if you smoke, drink alcohol, or use illegal drugs. Some items may interact with your medicine. What should I watch for while using this medicine? Tell your doctor or healthcare professional if your symptoms do not start to get better or if they get worse. Many pads and wipes are safe for septic and sewer system disposal. Check specific product label. What side effects may I notice from receiving this  medicine? Side effects that you should report to your doctor or health care professional as soon as possible: -allergic reactions like skin rash, itching or hives, swelling of the face, lips, or tongue  Side effects that usually do not require medical attention (Report these to your doctor or health care professional if they continue or are bothersome.): -mild skin dryness or irritation This list may not describe all possible side effects.  Call your doctor for medical advice about side effects. You may report side effects to FDA at 1-800-FDA-1088. Where should I keep my medicine? Keep out of the reach of children. Store at room temperature between 15 and 30 degrees C (59 and 86 degrees F). Throw away any unused medicine after the expiration date. NOTE: This sheet is a summary. It may not cover all possible information. If you have questions about this medicine, talk to your doctor, pharmacist, or health care provider.  2013, [employer] Standard. (07/19/2010 1:23:17 PM)

## 2013-08-11 NOTE — Assessment & Plan Note (Addendum)
Triglycerides elevated in the past.  Will check lipid panel today.

## 2013-08-11 NOTE — Assessment & Plan Note (Addendum)
Assessment:  BP well controlled.  111/75 today.  Patient compliant with medications.  Plan:    1) Continue triamterene-HCTZ 37.5/25 daily and atenolol 25mg  daily  2) Return to clinic 3-4 months for follow-up.

## 2013-08-11 NOTE — Assessment & Plan Note (Signed)
Assessment:  Patient compliant with TID iron.  He has never had a colonoscopy.  Discussed colonoscopy with his mother but she does not want him to be screened at this time.  She says she will consider it.    Plan:    1) Check CBC today  2) Continue Fe 325mg  TID with meals  3) Offer colonoscopy at next visit

## 2013-08-16 NOTE — Progress Notes (Signed)
I saw and evaluated the patient.  I personally confirmed the key portions of the history and exam documented by Dr. Wilson and I reviewed pertinent patient test results.  The assessment, diagnosis, and plan were formulated together and I agree with the documentation in the resident's note. 

## 2013-08-17 ENCOUNTER — Other Ambulatory Visit: Payer: Self-pay | Admitting: *Deleted

## 2013-08-18 NOTE — Telephone Encounter (Signed)
Already filled at visit on 08/11/13.

## 2013-10-13 DIAGNOSIS — F39 Unspecified mood [affective] disorder: Secondary | ICD-10-CM | POA: Diagnosis not present

## 2013-10-22 ENCOUNTER — Encounter (HOSPITAL_COMMUNITY): Payer: Self-pay | Admitting: Emergency Medicine

## 2013-10-22 ENCOUNTER — Emergency Department (INDEPENDENT_AMBULATORY_CARE_PROVIDER_SITE_OTHER)
Admission: EM | Admit: 2013-10-22 | Discharge: 2013-10-22 | Disposition: A | Discharge status: Home / Self Care | Payer: Medicare Other | Source: Home / Self Care | Attending: Family Medicine | Admitting: Family Medicine

## 2013-10-22 DIAGNOSIS — K649 Unspecified hemorrhoids: Secondary | ICD-10-CM

## 2013-10-22 MED ORDER — HYDROCORTISONE 2.5 % RE CREA: TOPICAL_CREAM | RECTAL | Status: DC

## 2013-10-22 MED ORDER — HYDROCODONE-ACETAMINOPHEN 5-325 MG PO TABS: 2.0000 | ORAL_TABLET | ORAL | Status: DC | PRN

## 2013-10-22 MED ORDER — DOCUSATE SODIUM 100 MG PO CAPS: 100.0000 mg | ORAL_CAPSULE | Freq: Two times a day (BID) | ORAL | Status: DC

## 2013-10-22 NOTE — ED Provider Notes (Signed)
CSN: [id number]     Arrival date & time 10/22/13  1051 History   First MD Initiated Contact with Patient 10/22/13 1238     Chief Complaint  Patient presents with  . Rectal Bleeding   (Consider location/radiation/quality/duration/timing/severity/associated sxs/prior Treatment) Patient is a 57 y.o. male presenting with hematochezia. The history is provided by the patient. No language interpreter was used.  Rectal Bleeding Quality:  Bright red Amount:  Moderate Timing:  Constant Progression:  Worsening Chronicity:  New Context: hemorrhoids   Similar prior episodes: no   Relieved by:  Nothing Worsened by:  Nothing tried Ineffective treatments:  None tried Associated symptoms comment:  Bleeding from hemmorhoids   Past Medical History  Diagnosis Date  . Deaf-mutism   . MR (mental retardation)   . Schizophrenia, paranoid, chronic   . Chronic constipation   . Hypertriglyceridemia   . Dental caries   . Intertrigo   . Umbilical hernia   . History of iron deficiency anemia    History reviewed. No pertinent past surgical history. Family History  Problem Relation Age of Onset  . Diabetes Maternal Aunt   . Hypertension Mother    History  Substance Use Topics  . Smoking status: Never Smoker   . Smokeless tobacco: Not on file  . Alcohol Use: No    Review of Systems  Gastrointestinal: Positive for hematochezia and rectal pain.  All other systems reviewed and are negative.    Allergies  Review of patient's allergies indicates no known allergies.  Home Medications   Current Outpatient Rx  Name  Route  Sig  Dispense  Refill  . atenolol (TENORMIN) 25 MG tablet   Oral   Take 1 tablet (25 mg total) by mouth daily.   90 tablet   1   . cetirizine (ZYRTEC) 10 MG tablet   Oral   Take 1 tablet (10 mg total) by mouth daily.   30 tablet   2   . ferrous sulfate 325 (65 FE) MG tablet   Oral   Take 1 tablet (325 mg total) by mouth 3 (three) times daily with meals.   90  tablet   2   . fluPHENAZine decanoate (PROLIXIN) 25 MG/ML injection   Intramuscular   Inject 32.5 mg into the muscle every 21 ( twenty-one) days. Next dose due 03/16/2013         . fluticasone (FLONASE) 50 MCG/ACT nasal spray   Nasal   Place 1 spray into the nose daily.   16 g   6   . omeprazole (PRILOSEC) 20 MG capsule      TAKE ONE CAPSULE BY MOUTH EVERY DAY   30 capsule   2   . polyethylene glycol powder (MIRALAX) powder   Oral   Take 17 g by mouth daily.   255 g   0   . potassium chloride (K-DUR) 10 MEQ tablet   Oral   Take 1 tablet (10 mEq total) by mouth daily.   90 tablet   0   . Psyllium (EQ FIBER THERAPY) 48.57 % POWD   Oral   Take 15 g by mouth daily.   1254 g   3   . triamterene-hydrochlorothiazide (DYAZIDE) 37.5-25 MG per capsule   Oral   Take 1 each (1 capsule total) by mouth daily.   90 capsule   1   . Witch Hazel (TUCKS) 50 % PADS   Apply externally   Apply 1 application topically 3 (three) times daily as needed.  Apply to anorectal area as needed 3 times daily or after bowel movements.   1 each   2    BP 116/80  Pulse 74  Temp(Src) 97.6 F (36.4 C) (Oral)  Resp 18  SpO2 97% Physical Exam  Nursing note and vitals reviewed. Constitutional: He is oriented to person, place, and time. He appears well-developed.  HENT:  Head: Normocephalic.  Cardiovascular: Normal rate.   Pulmonary/Chest: Effort normal.  Abdominal: Soft.  Musculoskeletal: Normal range of motion.  Neurological: He is alert and oriented to person, place, and time. He has normal reflexes.  Skin: Skin is warm.  Psychiatric: He has a normal mood and affect.    ED Course  Procedures (including critical care time) Labs Review Labs Reviewed - No data to display Imaging Review No results found.  EKG Interpretation    Date/Time:    Ventricular Rate:    PR Interval:    QRS Duration:   QT Interval:    QTC Calculation:   R Axis:     Text Interpretation:               MDM   1. Hemorrhoids       Elson Areas, PA-C 10/22/13 1315  Elson Areas, PA-C 10/22/13 1315

## 2013-10-22 NOTE — ED Provider Notes (Signed)
Medical screening examination/treatment/procedure(s) were performed by resident physician or non-physician practitioner and as supervising physician I was immediately available for consultation/collaboration.   KINDL,Geddy DOUGLAS MD.   Broly D Kindl, MD 10/22/13 1700 

## 2013-10-22 NOTE — ED Notes (Signed)
Mother reports blood in stool noted this am.  History of constipation

## 2013-11-10 ENCOUNTER — Encounter: Payer: Self-pay | Admitting: Internal Medicine

## 2013-11-10 ENCOUNTER — Ambulatory Visit (INDEPENDENT_AMBULATORY_CARE_PROVIDER_SITE_OTHER): Payer: Medicare Other | Admitting: Internal Medicine

## 2013-11-10 VITALS — BP 123/83 | HR 66 | Temp 96.9°F | Ht 67.0 in | Wt 194.0 lb

## 2013-11-10 DIAGNOSIS — E876 Hypokalemia: Secondary | ICD-10-CM | POA: Diagnosis not present

## 2013-11-10 DIAGNOSIS — J309 Allergic rhinitis, unspecified: Secondary | ICD-10-CM

## 2013-11-10 DIAGNOSIS — I10 Essential (primary) hypertension: Secondary | ICD-10-CM

## 2013-11-10 DIAGNOSIS — D500 Iron deficiency anemia secondary to blood loss (chronic): Secondary | ICD-10-CM

## 2013-11-10 DIAGNOSIS — D649 Anemia, unspecified: Secondary | ICD-10-CM

## 2013-11-10 DIAGNOSIS — K5900 Constipation, unspecified: Secondary | ICD-10-CM | POA: Diagnosis not present

## 2013-11-10 DIAGNOSIS — E781 Pure hyperglyceridemia: Secondary | ICD-10-CM

## 2013-11-10 MED ORDER — CETIRIZINE HCL 10 MG PO TABS: 10.0000 mg | ORAL_TABLET | Freq: Every day | ORAL | Status: DC

## 2013-11-10 MED ORDER — POTASSIUM CHLORIDE ER 20 MEQ PO TBCR: 20.0000 meq | EXTENDED_RELEASE_TABLET | Freq: Two times a day (BID) | ORAL | Status: DC

## 2013-11-10 MED ORDER — GEMFIBROZIL 600 MG PO TABS: 600.0000 mg | ORAL_TABLET | Freq: Two times a day (BID) | ORAL | Status: DC

## 2013-11-10 NOTE — Assessment & Plan Note (Signed)
Assessment:  He has had rhinorrhea and cough for about 1 week usually occuring in the morning.  He has no cough or sneezing and is afebrile in office today.  Symptoms have only been present for 1 week so likely URI versus allergic rhinitis exacerbation.   Plan:  Continue flonase and cetirizine

## 2013-11-10 NOTE — Patient Instructions (Signed)
1. Please return to the office in 3 months for BP check and labwork.   2. Please take all medications as prescribed.     3. If you have worsening of your symptoms or new symptoms arise, please call the clinic ([phone]), or go to the ER immediately if symptoms are severe.

## 2013-11-10 NOTE — Assessment & Plan Note (Addendum)
Assessment:  Patient compliant with TID Fe.  His anemia is stable, Hgb 12.9 in September.  His mother is now willing to proceed with colonoscopy.  Plan: 1) continue Fe            2) Refer for colonoscopy.

## 2013-11-10 NOTE — Assessment & Plan Note (Signed)
Assessment:  TG still elevated at 377.  I am not sure why gemfibrozil was stopped in the past.  His mother does not remember why it was stopped either.  He has no known allergies.    Plan:  Will resume gemfibrozil 600mg  BID

## 2013-11-10 NOTE — Progress Notes (Signed)
   Subjective:    Patient ID: Leonard Tucker, male    DOB: 1956-09-22, 57 y.o.   MRN: [id number]  HPI Comments: Mr. [name] is 57 year old male with a PMH of MR, deaf mutism, HTN, hyptriglyceridemia, hypokalemia, hemorrhoids and anemia.  He presents today for routine check-up.  His mother is his primary caretaker and she is present to provide history.  She says he is doing well and that he is getting over a cold.  She notes rhinorrhea and morning cough productive of clear mucous.  Otherwise, he has had no change in appetite and has not complained of diarrhea, constipation, CP/SOB or problems urinating.  The two of them walk outside every day for about 30 minutes.  [name] is due for screening colonoscopy.  His mother has declined this in the past but is now willing to let him get the procedure done.     Review of Systems  Unable to perform ROS: Patient nonverbal       Objective:   Physical Exam  Vitals reviewed. Constitutional: He appears well-developed. No distress.  HENT:  Head: Normocephalic and atraumatic.  Mouth/Throat: Oropharynx is clear and moist. No oropharyngeal exudate.  Eyes: EOM are normal. Pupils are equal, round, and reactive to light. Right eye exhibits no discharge. Left eye exhibits no discharge. No scleral icterus.  Neck: Neck supple.  Cardiovascular: Normal rate, regular rhythm and normal heart sounds.   Pulmonary/Chest: Effort normal and breath sounds normal. No respiratory distress. He has no wheezes. He has no rales.  Abdominal: Soft. Bowel sounds are normal. He exhibits no distension. There is no tenderness.  Musculoskeletal: Normal range of motion. He exhibits no edema and no tenderness.  Neurological: He is alert. He exhibits normal muscle tone.  Skin: Skin is warm. He is not diaphoretic.  Psychiatric: He has a normal mood and affect.          Assessment & Plan:  Please see problem based assessment and plan.

## 2013-11-10 NOTE — Assessment & Plan Note (Signed)
BP Readings from Last 3 Encounters:  11/10/13 123/83  10/22/13 116/80  08/11/13 111/75    Lab Results  Component Value Date   NA 140 08/11/2013   K 3.4* 08/11/2013   CREATININE 0.90 08/11/2013    Assessment: Blood pressure control: controlled Progress toward BP goal:  at goal Comments: continue current medications  Plan: Medications:  Continue triamterene-HCTZ 37.5/25 daily and atenolol 25mg  daily

## 2013-11-10 NOTE — Assessment & Plan Note (Signed)
Assessment:  K still low, 3.4 despite daily and triamterene.  Plan:  Increase K to BID and recheck K in 3 months.

## 2013-11-11 NOTE — Progress Notes (Signed)
I saw and evaluated the patient. I personally confirmed the key portions of Dr. Wilson's history and exam and reviewed pertinent patient test results. The assessment, diagnosis, and plan were formulated together and I agree with the documentation in the resident's note. 

## 2014-01-10 ENCOUNTER — Other Ambulatory Visit: Payer: Self-pay | Admitting: Internal Medicine

## 2014-01-13 ENCOUNTER — Other Ambulatory Visit: Payer: Self-pay | Admitting: *Deleted

## 2014-01-21 MED ORDER — TRIAMTERENE-HCTZ 37.5-25 MG PO CAPS: ORAL_CAPSULE | ORAL | Status: DC

## 2014-01-23 ENCOUNTER — Other Ambulatory Visit: Payer: Self-pay | Admitting: Internal Medicine

## 2014-02-02 ENCOUNTER — Other Ambulatory Visit: Payer: Self-pay | Admitting: Internal Medicine

## 2014-02-09 ENCOUNTER — Other Ambulatory Visit: Payer: Self-pay | Admitting: Internal Medicine

## 2014-02-16 ENCOUNTER — Ambulatory Visit (INDEPENDENT_AMBULATORY_CARE_PROVIDER_SITE_OTHER): Payer: Medicare Other | Admitting: Internal Medicine

## 2014-02-16 ENCOUNTER — Encounter: Payer: Self-pay | Admitting: Internal Medicine

## 2014-02-16 VITALS — BP 114/81 | HR 63 | Temp 97.0°F | Ht 68.0 in | Wt 200.4 lb

## 2014-02-16 DIAGNOSIS — E876 Hypokalemia: Secondary | ICD-10-CM

## 2014-02-16 DIAGNOSIS — J309 Allergic rhinitis, unspecified: Secondary | ICD-10-CM | POA: Diagnosis not present

## 2014-02-16 DIAGNOSIS — D500 Iron deficiency anemia secondary to blood loss (chronic): Secondary | ICD-10-CM

## 2014-02-16 DIAGNOSIS — I10 Essential (primary) hypertension: Secondary | ICD-10-CM

## 2014-02-16 DIAGNOSIS — K649 Unspecified hemorrhoids: Secondary | ICD-10-CM | POA: Diagnosis not present

## 2014-02-16 DIAGNOSIS — K5900 Constipation, unspecified: Secondary | ICD-10-CM | POA: Diagnosis not present

## 2014-02-16 DIAGNOSIS — E781 Pure hyperglyceridemia: Secondary | ICD-10-CM | POA: Diagnosis not present

## 2014-02-16 DIAGNOSIS — Z23 Encounter for immunization: Secondary | ICD-10-CM | POA: Diagnosis not present

## 2014-02-16 DIAGNOSIS — D649 Anemia, unspecified: Secondary | ICD-10-CM | POA: Diagnosis not present

## 2014-02-16 LAB — BASIC METABOLIC PANEL WITH GFR
BUN: 15 mg/dL (ref 6–23)
CALCIUM: 10.3 mg/dL (ref 8.4–10.5)
CO2: 29 mEq/L (ref 19–32)
CREATININE: 0.83 mg/dL (ref 0.50–1.35)
Chloride: 99 mEq/L (ref 96–112)
GLUCOSE: 106 mg/dL — AB (ref 70–99)
Potassium: 3.6 mEq/L (ref 3.5–5.3)
Sodium: 139 mEq/L (ref 135–145)

## 2014-02-16 NOTE — Patient Instructions (Signed)
1. Please take flonase nasal spray daily while you have allergy symptoms (cough, runny nose, watery eyes).   2. Please take all medications as prescribed.   3. If you have worsening of your symptoms or new symptoms arise, please call the clinic ([phone]), or go to the ER immediately if symptoms are severe.   Please return to clinic in 6 months to 1 year.

## 2014-02-16 NOTE — Progress Notes (Signed)
   Subjective:    Patient ID: Leonard Tucker, male    DOB: [birth year], 58 y.o.   MRN: [id number]  HPI Comments: Mr. Hubbert is 58 year old male with a PMH of MR, deaf mutism, HTN, hyptriglyceridemia, hypokalemia, hemorrhoids and anemia.  His mother is his primary caretaker and she is present to provide history.  He has c/o of dry cough x 2 weeks in the AM.  Productive of clear mucous at times. Cough drops help.  Also, watery eyes and rhinorrhea.  He is still taking Zyrtec daily and Flonase 3 times per week.  Denies dyspnea, headache, facial pain, congestion or sore throat.  Appetite unchanged and behavior normal.  Cough Associated symptoms include rhinorrhea. Pertinent negatives include no chest pain, headaches, sore throat, shortness of breath or wheezing.      Review of Systems  Constitutional: Negative for appetite change.  HENT: Positive for congestion and rhinorrhea. Negative for sinus pressure, sneezing and sore throat.   Respiratory: Positive for cough. Negative for shortness of breath and wheezing.   Cardiovascular: Negative for chest pain.  Gastrointestinal: Negative for nausea, vomiting, abdominal pain and diarrhea.  Neurological: Negative for headaches.       Objective:   Physical Exam  Vitals reviewed. Constitutional: He is oriented to person, place, and time. He appears well-developed. No distress.  HENT:  Nose: Nose normal.  Mouth/Throat: Oropharynx is clear and moist.  Clear mucous in nares; no sinus tenderness.  Eyes: Pupils are equal, round, and reactive to light.  Cardiovascular: Normal rate, regular rhythm and normal heart sounds.   Pulmonary/Chest: Effort normal and breath sounds normal. No respiratory distress. He has no wheezes. He has no rales.  Abdominal: Soft. He exhibits no distension. There is no tenderness.  Lymphadenopathy:    He has no cervical adenopathy.  Neurological: He is alert and oriented to person, place, and time.  Skin: Skin is warm. He is not  diaphoretic.          Assessment & Plan:  Please see problem based assessment and plan.

## 2014-02-17 ENCOUNTER — Other Ambulatory Visit: Payer: Self-pay | Admitting: Internal Medicine

## 2014-02-17 DIAGNOSIS — E876 Hypokalemia: Secondary | ICD-10-CM

## 2014-02-17 DIAGNOSIS — J309 Allergic rhinitis, unspecified: Secondary | ICD-10-CM

## 2014-02-17 MED ORDER — FLUTICASONE PROPIONATE 50 MCG/ACT NA SUSP: 1.0000 | Freq: Every day | NASAL | Status: DC

## 2014-02-17 MED ORDER — ATENOLOL 25 MG PO TABS: 25.0000 mg | ORAL_TABLET | Freq: Every day | ORAL | Status: DC

## 2014-02-17 MED ORDER — OMEPRAZOLE 20 MG PO CPDR
DELAYED_RELEASE_CAPSULE | ORAL | Status: DC
Start: 2014-02-17 — End: 2014-08-29

## 2014-02-17 MED ORDER — POTASSIUM CHLORIDE ER 20 MEQ PO TBCR: 20.0000 meq | EXTENDED_RELEASE_TABLET | Freq: Two times a day (BID) | ORAL | Status: DC

## 2014-02-17 MED ORDER — GUAIFENESIN 100 MG/5ML PO SOLN: 5.0000 mL | Freq: Four times a day (QID) | ORAL | Status: DC | PRN

## 2014-02-17 NOTE — Assessment & Plan Note (Signed)
Assessment:  He has symptoms consistent with allergic rhinitis.  No fever/chills, sinus tenderness, headache or prolonged symptoms to suggest bacterial etiology.  He has had no change in behavior or appetite.  His mother has not been giving him Flonase consistently.  She does give him cetirizine daily.  Plan:  Use flonase daily and continue cetirizine.

## 2014-02-17 NOTE — Assessment & Plan Note (Signed)
He has not gone for colonoscopy.  His mom continues to be apprehensive as it would be difficult for him to undergo the prep and actual procedure.  She would like for him to do stool cards.  Will provide with stool cards.

## 2014-02-17 NOTE — Assessment & Plan Note (Signed)
K is within normal range today.  Will continue K supplementation.  Re-check BMP in 6 months.

## 2014-02-17 NOTE — Assessment & Plan Note (Signed)
BP Readings from Last 3 Encounters:  02/16/14 114/81  11/10/13 123/83  10/22/13 116/80    Lab Results  Component Value Date   NA 139 02/16/2014   K 3.6 02/16/2014   CREATININE 0.83 02/16/2014    Assessment: Blood pressure control:  well controlled  Progress toward BP goal:   at goal  Plan: Medications:  continue current medications:  Triamterene-HCTZ 37.5/25 daily and atenolol 25mg  daily Educational resources provided: brochure (given to mother) Other plans: return for BP check in 6 months

## 2014-02-18 ENCOUNTER — Encounter: Payer: Self-pay | Admitting: *Deleted

## 2014-02-18 NOTE — Progress Notes (Signed)
Case discussed with Dr. Wilson soon after the resident saw the patient. We reviewed the resident's history and exam and pertinent patient test results. I agree with the assessment, diagnosis, and plan of care documented in the resident's note. 

## 2014-02-22 ENCOUNTER — Telehealth: Payer: Self-pay | Admitting: *Deleted

## 2014-02-22 ENCOUNTER — Other Ambulatory Visit: Payer: Self-pay | Admitting: Internal Medicine

## 2014-02-22 DIAGNOSIS — Z1211 Encounter for screening for malignant neoplasm of colon: Secondary | ICD-10-CM

## 2014-02-22 NOTE — Telephone Encounter (Signed)
Call from pt's mom about questions about her GI visit today.  Pt's mom was informed that stool cards were mailed to her home for pt to do.  Pt's mom stated that she has decided that it would be better for the patient to have a Colonoscopy instead of doing the cards.  Feels that the patient will not understand what he has to do.  Message to be sent to Dr. Redmond Pulling to order a Colonoscopy for the patient.  Sander Nephew, RN 02/22/2014  9:32 AM

## 2014-03-24 DIAGNOSIS — K921 Melena: Secondary | ICD-10-CM | POA: Diagnosis not present

## 2014-03-24 DIAGNOSIS — K5900 Constipation, unspecified: Secondary | ICD-10-CM | POA: Diagnosis not present

## 2014-04-12 DIAGNOSIS — F29 Unspecified psychosis not due to a substance or known physiological condition: Secondary | ICD-10-CM | POA: Diagnosis not present

## 2014-04-14 ENCOUNTER — Other Ambulatory Visit: Payer: Self-pay | Admitting: Internal Medicine

## 2014-05-09 DIAGNOSIS — D126 Benign neoplasm of colon, unspecified: Secondary | ICD-10-CM | POA: Diagnosis not present

## 2014-05-09 DIAGNOSIS — K644 Residual hemorrhoidal skin tags: Secondary | ICD-10-CM | POA: Diagnosis not present

## 2014-05-09 DIAGNOSIS — K921 Melena: Secondary | ICD-10-CM | POA: Diagnosis not present

## 2014-06-03 ENCOUNTER — Other Ambulatory Visit: Payer: Self-pay | Admitting: Internal Medicine

## 2014-06-12 ENCOUNTER — Other Ambulatory Visit: Payer: Self-pay | Admitting: Internal Medicine

## 2014-06-13 ENCOUNTER — Other Ambulatory Visit: Payer: Self-pay | Admitting: Internal Medicine

## 2014-07-15 ENCOUNTER — Other Ambulatory Visit: Payer: Self-pay | Admitting: Internal Medicine

## 2014-08-29 ENCOUNTER — Encounter (HOSPITAL_COMMUNITY): Payer: Self-pay | Admitting: Emergency Medicine

## 2014-08-29 ENCOUNTER — Emergency Department (HOSPITAL_COMMUNITY)
Admission: EM | Admit: 2014-08-29 | Discharge: 2014-08-29 | Disposition: A | Discharge status: Home / Self Care | Payer: Medicare Other | Attending: Emergency Medicine | Admitting: Emergency Medicine

## 2014-08-29 DIAGNOSIS — D509 Iron deficiency anemia, unspecified: Secondary | ICD-10-CM | POA: Diagnosis not present

## 2014-08-29 DIAGNOSIS — E781 Pure hyperglyceridemia: Secondary | ICD-10-CM | POA: Diagnosis not present

## 2014-08-29 DIAGNOSIS — Z79899 Other long term (current) drug therapy: Secondary | ICD-10-CM | POA: Diagnosis not present

## 2014-08-29 DIAGNOSIS — IMO0002 Reserved for concepts with insufficient information to code with codable children: Secondary | ICD-10-CM | POA: Insufficient documentation

## 2014-08-29 DIAGNOSIS — F200 Paranoid schizophrenia: Secondary | ICD-10-CM | POA: Diagnosis not present

## 2014-08-29 DIAGNOSIS — R197 Diarrhea, unspecified: Secondary | ICD-10-CM | POA: Insufficient documentation

## 2014-08-29 DIAGNOSIS — H913 Deaf nonspeaking, not elsewhere classified: Secondary | ICD-10-CM | POA: Diagnosis not present

## 2014-08-29 DIAGNOSIS — R112 Nausea with vomiting, unspecified: Secondary | ICD-10-CM | POA: Diagnosis not present

## 2014-08-29 DIAGNOSIS — Z8719 Personal history of other diseases of the digestive system: Secondary | ICD-10-CM | POA: Insufficient documentation

## 2014-08-29 LAB — COMPREHENSIVE METABOLIC PANEL
ALT: 24 U/L (ref 0–53)
AST: 26 U/L (ref 0–37)
Albumin: 4.4 g/dL (ref 3.5–5.2)
Alkaline Phosphatase: 85 U/L (ref 39–117)
Anion gap: 16 — ABNORMAL HIGH (ref 5–15)
BUN: 12 mg/dL (ref 6–23)
CO2: 24 meq/L (ref 19–32)
Calcium: 9.7 mg/dL (ref 8.4–10.5)
Chloride: 100 mEq/L (ref 96–112)
Creatinine, Ser: 1.06 mg/dL (ref 0.50–1.35)
GFR calc Af Amer: 89 mL/min — ABNORMAL LOW (ref >90)
GFR calc non Af Amer: 77 mL/min — ABNORMAL LOW (ref >90)
Glucose, Bld: 112 mg/dL — ABNORMAL HIGH (ref 70–99)
Potassium: 3.2 mEq/L — ABNORMAL LOW (ref 3.7–5.3)
SODIUM: 140 meq/L (ref 137–147)
TOTAL PROTEIN: 8.4 g/dL — AB (ref 6.0–8.3)
Total Bilirubin: 0.3 mg/dL (ref 0.3–1.2)

## 2014-08-29 LAB — CBC WITH DIFFERENTIAL/PLATELET
BASOS ABS: 0.1 10*3/uL (ref 0.0–0.1)
Basophils Relative: 1 % (ref 0–1)
EOS ABS: 0.2 10*3/uL (ref 0.0–0.7)
EOS PCT: 2 % (ref 0–5)
HCT: 41 % (ref 39.0–52.0)
Hemoglobin: 13.6 g/dL (ref 13.0–17.0)
LYMPHS PCT: 36 % (ref 12–46)
Lymphs Abs: 2.4 10*3/uL (ref 0.7–4.0)
MCH: 26.6 pg (ref 26.0–34.0)
MCHC: 33.2 g/dL (ref 30.0–36.0)
MCV: 80.2 fL (ref 78.0–100.0)
Monocytes Absolute: 1.1 10*3/uL — ABNORMAL HIGH (ref 0.1–1.0)
Monocytes Relative: 17 % — ABNORMAL HIGH (ref 3–12)
Neutro Abs: 2.9 10*3/uL (ref 1.7–7.7)
Neutrophils Relative %: 44 % (ref 43–77)
Platelets: 228 10*3/uL (ref 150–400)
RBC: 5.11 MIL/uL (ref 4.22–5.81)
RDW: 14.1 % (ref 11.5–15.5)
WBC: 6.6 10*3/uL (ref 4.0–10.5)

## 2014-08-29 LAB — URINALYSIS, ROUTINE W REFLEX MICROSCOPIC
Bilirubin Urine: NEGATIVE
GLUCOSE, UA: NEGATIVE mg/dL
Hgb urine dipstick: NEGATIVE
Ketones, ur: NEGATIVE mg/dL
LEUKOCYTES UA: NEGATIVE
Nitrite: NEGATIVE
PH: 5 (ref 5.0–8.0)
Protein, ur: NEGATIVE mg/dL
Specific Gravity, Urine: 1.013 (ref 1.005–1.030)
Urobilinogen, UA: 0.2 mg/dL (ref 0.0–1.0)

## 2014-08-29 LAB — LIPASE, BLOOD: Lipase: 42 U/L (ref 11–59)

## 2014-08-29 MED ORDER — SODIUM CHLORIDE 0.9 % IV BOLUS (SEPSIS)
1000.0000 mL | Freq: Once | INTRAVENOUS | Status: AC
  Administered 2014-08-29: 1000 mL via INTRAVENOUS

## 2014-08-29 MED ORDER — ONDANSETRON 8 MG PO TBDP: 8.0000 mg | ORAL_TABLET | Freq: Three times a day (TID) | ORAL | Status: DC | PRN

## 2014-08-29 MED ORDER — ONDANSETRON HCL 4 MG/2ML IJ SOLN
4.0000 mg | Freq: Once | INTRAMUSCULAR | Status: AC
  Administered 2014-08-29: 4 mg via INTRAVENOUS
  Filled 2014-08-29: qty 2

## 2014-08-29 NOTE — ED Notes (Signed)
Per mom who is also being seen for similar symptoms: pt is deaf and mute, pt starting to vomit yesterday afternoon. Mom is being seen in the ED for exact same symptoms. Unable to tell how many times pt has vomited. Mom reports son has had diarrhea with the vomiting.

## 2014-08-29 NOTE — ED Provider Notes (Signed)
CSN: [id number]     Arrival date & time 08/29/14  0130 History   First MD Initiated Contact with Patient 08/29/14 [phone]     Chief Complaint  Patient presents with  . Emesis     Level V caveat: Deaf/Mute/MR  The history is provided by a parent.   patient is brought to the emergency department by his mother after reports of vomiting today.  The vomit was described as nonbloody nonbilious.  Mother is in the ER with nausea vomiting that started today as well.  Patient has had some nonbloody diarrhea as well.  Mother reports no fever.  Mild decreased oral intake today per mother.  She denies abdominal distention.  Mother reports compliant with medications  Past Medical History  Diagnosis Date  . Deaf-mutism   . MR (mental retardation)   . Schizophrenia, paranoid, chronic   . Chronic constipation   . Hypertriglyceridemia   . Dental caries   . Intertrigo   . Umbilical hernia   . History of iron deficiency anemia    History reviewed. No pertinent past surgical history. Family History  Problem Relation Age of Onset  . Diabetes Maternal Aunt   . Hypertension Mother    History  Substance Use Topics  . Smoking status: Never Smoker   . Smokeless tobacco: Not on file  . Alcohol Use: No    Review of Systems  Gastrointestinal: Positive for vomiting.  All other systems reviewed and are negative.     Allergies  Review of patient's allergies indicates no known allergies.  Home Medications   Prior to Admission medications   Medication Sig Start Date End Date Taking? Authorizing Provider  atenolol (TENORMIN) 25 MG tablet Take 1 tablet (25 mg total) by mouth daily. 02/17/14  Yes Francesca Oman, DO  cetirizine (ZYRTEC) 10 MG tablet Take 10 mg by mouth daily.   Yes Historical Provider, MD  docusate sodium (COLACE) 100 MG capsule Take 1 capsule (100 mg total) by mouth every 12 (twelve) hours. 10/22/13  Yes Fransico Meadow, PA-C  ferrous sulfate 325 (65 FE) MG tablet Take 325 mg by mouth 3  (three) times daily with meals.   Yes Historical Provider, MD  fluPHENAZine decanoate (PROLIXIN) 25 MG/ML injection Inject 32.5 mg into the muscle every 14 (fourteen) days.    Yes Historical Provider, MD  fluticasone (FLONASE) 50 MCG/ACT nasal spray Place 1 spray into both nostrils daily. 02/17/14 02/17/15 Yes Alex Ronnie Derby, DO  gemfibrozil (LOPID) 600 MG tablet Take 600 mg by mouth 2 (two) times daily before a meal.   Yes Historical Provider, MD  omeprazole (PRILOSEC) 20 MG capsule Take 20 mg by mouth daily.   Yes Historical Provider, MD  polyethylene glycol powder (MIRALAX) powder Take 17 g by mouth daily. 08/11/13  Yes Alex Ronnie Derby, DO  Potassium Chloride ER 20 MEQ TBCR Take 20 mEq by mouth 2 (two) times daily. 02/17/14  Yes Alex Ronnie Derby, DO  Psyllium (EQ FIBER THERAPY) 48.57 % POWD Take 15 g by mouth daily. 10/31/11  Yes Janell Quiet, MD  triamterene-hydrochlorothiazide (DYAZIDE) 37.5-25 MG per capsule Take 1 capsule by mouth daily.   Yes Historical Provider, MD  Witch Hazel (TUCKS) 50 % PADS Apply 1 application topically 3 (three) times daily as needed. Apply to anorectal area as needed 3 times daily or after bowel movements. 08/11/13  Yes Alex Ronnie Derby, DO  hydrocortisone (ANUSOL-HC) 2.5 % rectal cream Apply rectally 2 times daily 10/22/13   Hollace Kinnier  Sofia, PA-C  ondansetron (ZOFRAN ODT) 8 MG disintegrating tablet Take 1 tablet (8 mg total) by mouth every 8 (eight) hours as needed for nausea or vomiting. 08/29/14   Hoy Morn, MD   BP 115/73  Pulse 77  Temp(Src) 97.7 F (36.5 C) (Oral)  Resp 20  SpO2 99% Physical Exam  Nursing note and vitals reviewed. Constitutional: He appears well-developed and well-nourished.  HENT:  Head: Normocephalic and atraumatic.  Eyes: EOM are normal.  Neck: Normal range of motion.  Cardiovascular: Normal rate, regular rhythm, normal heart sounds and intact distal pulses.   Pulmonary/Chest: Effort normal and breath sounds normal. No respiratory distress.   Abdominal: Soft. He exhibits no distension. There is no tenderness.  Musculoskeletal: Normal range of motion.  Neurological: He is alert.  Skin: Skin is warm and dry.  Psychiatric: He has a normal mood and affect. Judgment normal.    ED Course  Procedures (including critical care time) Labs Review Labs Reviewed  CBC WITH DIFFERENTIAL - Abnormal; Notable for the following:    Monocytes Relative 17 (*)    Monocytes Absolute 1.1 (*)    All other components within normal limits  COMPREHENSIVE METABOLIC PANEL - Abnormal; Notable for the following:    Potassium 3.2 (*)    Glucose, Bld 112 (*)    Total Protein 8.4 (*)    GFR calc non Af Amer 77 (*)    GFR calc Af Amer 89 (*)    Anion gap 16 (*)    All other components within normal limits  URINALYSIS, ROUTINE W REFLEX MICROSCOPIC - Abnormal; Notable for the following:    APPearance CLOUDY (*)    All other components within normal limits  LIPASE, BLOOD    Imaging Review No results found.   EKG Interpretation None      MDM   Final diagnoses:  Nausea vomiting and diarrhea   Patient is overall well-appearing.  Low suspicion for intra-abdominal pathology.  Likely viral process given that the mother is here with similar symptoms.  Patient is now tolerating fluids in the ER.  Discharge home in good condition.     Hoy Morn, MD 08/29/14 [phone]

## 2014-08-29 NOTE — ED Notes (Signed)
Pt alert, NAD, calm, no dyspnea noted, no s/sx of pain at this time, non-verbal pain score used, mother also a pt in stretcher at [facility] in same room agrees, "he is OK right now", pt's aunt also present in room.

## 2014-08-29 NOTE — ED Notes (Signed)
Dr. Campos at [facility]. 

## 2014-08-29 NOTE — ED Notes (Signed)
Mom states she gave the pt something for his bowels. Mom unable to tell what the medication was.

## 2014-08-29 NOTE — ED Notes (Signed)
Tolerating PO fluids °

## 2014-08-29 NOTE — ED Notes (Addendum)
Pt resting, NAD, calm, alert, interactive, pt is mute, but gave thumbs up and smiled. Mother (also a pt) currently sleeping in next bed. VSS.

## 2014-09-13 ENCOUNTER — Other Ambulatory Visit: Payer: Self-pay | Admitting: Internal Medicine

## 2014-09-21 ENCOUNTER — Other Ambulatory Visit: Payer: Self-pay | Admitting: Internal Medicine

## 2014-09-28 ENCOUNTER — Encounter: Payer: Medicare Other | Admitting: Internal Medicine

## 2014-09-30 ENCOUNTER — Ambulatory Visit: Payer: Medicare Other

## 2014-09-30 ENCOUNTER — Other Ambulatory Visit: Payer: Self-pay | Admitting: Internal Medicine

## 2014-10-04 DIAGNOSIS — F209 Schizophrenia, unspecified: Secondary | ICD-10-CM | POA: Diagnosis not present

## 2014-10-19 ENCOUNTER — Encounter: Payer: Self-pay | Admitting: Internal Medicine

## 2014-10-19 ENCOUNTER — Ambulatory Visit (INDEPENDENT_AMBULATORY_CARE_PROVIDER_SITE_OTHER): Payer: Medicare Other | Admitting: Internal Medicine

## 2014-10-19 VITALS — BP 116/65 | HR 77 | Temp 97.5°F | Ht 68.0 in | Wt 198.2 lb

## 2014-10-19 DIAGNOSIS — Z1211 Encounter for screening for malignant neoplasm of colon: Secondary | ICD-10-CM

## 2014-10-19 DIAGNOSIS — J069 Acute upper respiratory infection, unspecified: Secondary | ICD-10-CM | POA: Diagnosis not present

## 2014-10-19 DIAGNOSIS — D500 Iron deficiency anemia secondary to blood loss (chronic): Secondary | ICD-10-CM | POA: Diagnosis not present

## 2014-10-19 DIAGNOSIS — I10 Essential (primary) hypertension: Secondary | ICD-10-CM

## 2014-10-19 DIAGNOSIS — K5900 Constipation, unspecified: Secondary | ICD-10-CM | POA: Diagnosis not present

## 2014-10-19 DIAGNOSIS — Z23 Encounter for immunization: Secondary | ICD-10-CM | POA: Diagnosis not present

## 2014-10-19 DIAGNOSIS — F200 Paranoid schizophrenia: Secondary | ICD-10-CM

## 2014-10-19 DIAGNOSIS — E781 Pure hyperglyceridemia: Secondary | ICD-10-CM

## 2014-10-19 DIAGNOSIS — E876 Hypokalemia: Secondary | ICD-10-CM | POA: Diagnosis not present

## 2014-10-19 LAB — COMPLETE METABOLIC PANEL WITH GFR
ALT: 18 U/L (ref 0–53)
AST: 19 U/L (ref 0–37)
Albumin: 4.6 g/dL (ref 3.5–5.2)
Alkaline Phosphatase: 73 U/L (ref 39–117)
BILIRUBIN TOTAL: 0.3 mg/dL (ref 0.2–1.2)
BUN: 13 mg/dL (ref 6–23)
CALCIUM: 9.5 mg/dL (ref 8.4–10.5)
CHLORIDE: 100 meq/L (ref 96–112)
CO2: 26 meq/L (ref 19–32)
Creat: 0.97 mg/dL (ref 0.50–1.35)
GFR, Est Non African American: 87 mL/min
Glucose, Bld: 120 mg/dL — ABNORMAL HIGH (ref 70–99)
Potassium: 3.6 mEq/L (ref 3.5–5.3)
SODIUM: 140 meq/L (ref 135–145)
Total Protein: 8.3 g/dL (ref 6.0–8.3)

## 2014-10-19 LAB — LIPID PANEL
Cholesterol: 135 mg/dL (ref 0–200)
HDL: 32 mg/dL — ABNORMAL LOW (ref >39)
LDL CALC: 60 mg/dL (ref 0–99)
Total CHOL/HDL Ratio: 4.2 Ratio
Triglycerides: 215 mg/dL — ABNORMAL HIGH (ref <150)
VLDL: 43 mg/dL — AB (ref 0–40)

## 2014-10-19 NOTE — Progress Notes (Signed)
   Subjective:    Patient ID: FRIEND DORFMAN, male    DOB: [birth year], 58 y.o.   MRN: [id number]  HPI Comments: Mr. Crager is 58 year old male with a PMH of MR, deaf mutism, HTN, hyptriglyceridemia, hypokalemia, hemorrhoids and anemia here for follow-up. Please see problem based charting for an update of his medical conditions.  His mother is his primary caretaker and she is present to provide history.       Review of Systems  Constitutional: Negative for fever, chills and appetite change.  Respiratory: Positive for cough. Negative for shortness of breath.   Cardiovascular: Negative for chest pain.  Gastrointestinal: Negative for nausea, vomiting, diarrhea, constipation and blood in stool.  Genitourinary: Negative for dysuria.  Neurological: Negative for syncope and headaches.  Psychiatric/Behavioral: Negative for dysphoric mood.       Objective:   Physical Exam  Constitutional: He is oriented to person, place, and time. He appears well-developed. No distress.  HENT:  Head: Normocephalic and atraumatic.  Right Ear: External ear normal.  Left Ear: External ear normal.  Mouth/Throat: Oropharynx is clear and moist. No oropharyngeal exudate.  Sinuses nontender.  TMs obscured by small amount of wax.  No ear or mastoid tenderness.  Eyes: EOM are normal. Pupils are equal, round, and reactive to light. Right eye exhibits no discharge. Left eye exhibits no discharge.  Cardiovascular: Normal rate, regular rhythm and normal heart sounds.  Exam reveals no gallop and no friction rub.   No murmur heard. Pulmonary/Chest: Effort normal and breath sounds normal. No respiratory distress. He has no wheezes. He has no rales.  Abdominal: Soft. Bowel sounds are normal. He exhibits no distension. There is no tenderness. There is no rebound.  Musculoskeletal: Normal range of motion. He exhibits no edema or tenderness.  Lymphadenopathy:    He has no cervical adenopathy.  Neurological: He is alert and  oriented to person, place, and time. No cranial nerve deficit.  Skin: Skin is warm. He is not diaphoretic.  Psychiatric: He has a normal mood and affect.  Vitals reviewed.         Assessment & Plan:  Please see problem based assessment and plan.

## 2014-10-19 NOTE — Patient Instructions (Addendum)
1. Continue your Zyrtec and Flonase for your allergy and cold symptoms.  If you start to feel worse or develop fever or problem breathing come back to see me.  If symptoms are severe go to the emergency room.   2. Please take all medications as prescribed.   3. If you have worsening of your symptoms or new symptoms arise, please call the clinic ([phone]), or go to the ER immediately if symptoms are severe.  Thank you for bringing your medicines today. This helps [place] keep you safe from mistakes.  Please return in 6 months.

## 2014-10-20 DIAGNOSIS — J069 Acute upper respiratory infection, unspecified: Secondary | ICD-10-CM | POA: Insufficient documentation

## 2014-10-20 NOTE — Assessment & Plan Note (Signed)
Compliant with Fe supplementation.  Hgb stable 08/2014.  Continue Fe.

## 2014-10-20 NOTE — Assessment & Plan Note (Signed)
Check CMP this visit.

## 2014-10-20 NOTE — Assessment & Plan Note (Addendum)
BP Readings from Last 3 Encounters:  10/19/14 116/65  08/29/14 115/73  02/16/14 114/81    Lab Results  Component Value Date   NA 140 10/19/2014   K 3.6 10/19/2014   CREATININE 0.97 10/19/2014    Assessment: Blood pressure control:  well controlled Progress toward BP goal:   at goal  Plan: Medications:  continue current medications:  Triamterene-HCTZ 37.5-25mg  daily, atenolol 25mg  daily Other plans: CMP this visit (his mental health provider is requesting liver function test prior to adjusting psych meds so I will get full CMP instead of BMP)

## 2014-10-20 NOTE — Assessment & Plan Note (Signed)
Due to Fe supplementation but his mom says he has been regular lately.  Miralax prn.

## 2014-10-20 NOTE — Assessment & Plan Note (Signed)
Check lipids this visit.  Continue gemfibrozil.

## 2014-10-20 NOTE — Assessment & Plan Note (Signed)
Managed by [facility] for [place] ([place]).  He gets Prolixin q 21 days.  His mother says they may be changing his medication.  She has brought a form requesting I forward labwork (liver profile) to [place] today - will have result sent per Ms. Vary's request

## 2014-10-20 NOTE — Assessment & Plan Note (Signed)
He has been provided with stool cards in the past but it does not look like they were returned.  Will follow-up to see if he is willing to do the cards.  Colonoscopy would be difficult given his disabilities.

## 2014-10-20 NOTE — Assessment & Plan Note (Addendum)
Has c/o 4-5 days of cough (productive and then dry after using guafinesin), nasal congestion.  No fever/chills, no change in appetite or behavior, no breathing difficulty.  Symptoms improving.  Symptoms preceded by sore throat and improving after about 1 week c/w viral URI.  He also has allergies which may be contributing. - symptomatic treatment for viral URI - continue flonase, cetirizine for allergic symptoms - cough syrup prn - return to clinic if symptoms worsen, he develops fever, dyspnea or change in behavior - seek emergency care for severe symptoms

## 2014-10-21 NOTE — Progress Notes (Signed)
Internal Medicine Clinic Attending  Case discussed with Dr. Wilson soon after the resident saw the patient.  We reviewed the resident's history and exam and pertinent patient test results.  I agree with the assessment, diagnosis, and plan of care documented in the resident's note.  

## 2014-11-14 ENCOUNTER — Other Ambulatory Visit: Payer: Self-pay | Admitting: Internal Medicine

## 2014-12-22 ENCOUNTER — Other Ambulatory Visit: Payer: Self-pay | Admitting: Internal Medicine

## 2015-01-07 ENCOUNTER — Other Ambulatory Visit: Payer: Self-pay | Admitting: Internal Medicine

## 2015-01-12 ENCOUNTER — Other Ambulatory Visit: Payer: Self-pay | Admitting: Internal Medicine

## 2015-03-21 DIAGNOSIS — F209 Schizophrenia, unspecified: Secondary | ICD-10-CM | POA: Diagnosis not present

## 2015-04-19 ENCOUNTER — Other Ambulatory Visit: Payer: Self-pay | Admitting: Internal Medicine

## 2015-05-15 ENCOUNTER — Other Ambulatory Visit: Payer: Self-pay | Admitting: Internal Medicine

## 2015-05-31 ENCOUNTER — Encounter: Payer: Medicare Other | Admitting: Internal Medicine

## 2015-06-08 ENCOUNTER — Ambulatory Visit (INDEPENDENT_AMBULATORY_CARE_PROVIDER_SITE_OTHER): Payer: Medicare Other | Admitting: Internal Medicine

## 2015-06-08 ENCOUNTER — Encounter: Payer: Self-pay | Admitting: Internal Medicine

## 2015-06-08 VITALS — BP 111/71 | HR 70 | Temp 98.1°F | Ht 68.0 in | Wt 196.1 lb

## 2015-06-08 DIAGNOSIS — E876 Hypokalemia: Secondary | ICD-10-CM

## 2015-06-08 DIAGNOSIS — F200 Paranoid schizophrenia: Secondary | ICD-10-CM | POA: Diagnosis not present

## 2015-06-08 DIAGNOSIS — E781 Pure hyperglyceridemia: Secondary | ICD-10-CM | POA: Diagnosis not present

## 2015-06-08 DIAGNOSIS — D500 Iron deficiency anemia secondary to blood loss (chronic): Secondary | ICD-10-CM

## 2015-06-08 DIAGNOSIS — K5900 Constipation, unspecified: Secondary | ICD-10-CM

## 2015-06-08 DIAGNOSIS — I10 Essential (primary) hypertension: Secondary | ICD-10-CM | POA: Diagnosis not present

## 2015-06-08 DIAGNOSIS — J309 Allergic rhinitis, unspecified: Secondary | ICD-10-CM

## 2015-06-08 DIAGNOSIS — Z1211 Encounter for screening for malignant neoplasm of colon: Secondary | ICD-10-CM

## 2015-06-08 LAB — BASIC METABOLIC PANEL WITH GFR
BUN: 11 mg/dL (ref 6–23)
CHLORIDE: 98 meq/L (ref 96–112)
CO2: 27 mEq/L (ref 19–32)
Calcium: 9.5 mg/dL (ref 8.4–10.5)
Creat: 0.86 mg/dL (ref 0.50–1.35)
Glucose, Bld: 93 mg/dL (ref 70–99)
POTASSIUM: 3.1 meq/L — AB (ref 3.5–5.3)
Sodium: 139 mEq/L (ref 135–145)

## 2015-06-08 MED ORDER — POLYSACCHARIDE IRON COMPLEX 150 MG PO CAPS: 150.0000 mg | ORAL_CAPSULE | Freq: Every day | ORAL | Status: DC

## 2015-06-08 NOTE — Patient Instructions (Signed)
1. I changed your iron medication to see if this helps with constipation.     2. Please take all medications as prescribed.    3. If you have worsening of your symptoms or new symptoms arise, please call the clinic ([phone]), or go to the ER immediately if symptoms are severe.   Return to see me in 3 months.

## 2015-06-08 NOTE — Progress Notes (Signed)
Subjective:    Patient ID: Leonard Tucker, male    DOB: September 06, 1956, 59 y.o.   MRN: [id number]  HPI Comments: Ms. Craighead is a 59 year old male with PMH as below here for follow-up of chronic conditions.  Please see problem based charting for assessment and plan.     Past Medical History  Diagnosis Date  . Deaf-mutism   . MR (mental retardation)   . Schizophrenia, paranoid, chronic   . Chronic constipation   . Hypertriglyceridemia   . Dental caries   . Intertrigo   . Umbilical hernia   . History of iron deficiency anemia    Current Outpatient Prescriptions on File Prior to Visit  Medication Sig Dispense Refill  . atenolol (TENORMIN) 25 MG tablet TAKE 1 TABLET BY MOUTH EVERY DAY 90 tablet 1  . cetirizine (ZYRTEC) 10 MG tablet TAKE 1 TABLET BY MOUTH EVERY DAY 30 tablet 3  . docusate sodium (COLACE) 100 MG capsule Take 1 capsule (100 mg total) by mouth every 12 (twelve) hours. 60 capsule 0  . ferrous sulfate 325 (65 FE) MG tablet Take 325 mg by mouth 3 (three) times daily with meals.    . ferrous sulfate 325 (65 FE) MG tablet TAKE 1 TABLET BY MOUTH 3 TIMES A DAY WITH MEALS 90 tablet 2  . fluPHENAZine decanoate (PROLIXIN) 25 MG/ML injection Inject 32.5 mg into the muscle every 21 ( twenty-one) days.     . fluticasone (FLONASE) 50 MCG/ACT nasal spray Place 1 spray into both nostrils daily. 16 g 2  . gemfibrozil (LOPID) 600 MG tablet TAKE 1 TABLET BY MOUTH TWICE A DAY 30 MINUTES BEFORE BREAKFAST AND 30 MINS BEFORE DINNER 60 tablet 2  . hydrocortisone (ANUSOL-HC) 2.5 % rectal cream Apply rectally 2 times daily 30 g 1  . KLOR-CON M20 20 MEQ tablet TAKE 1 TABLET BY MOUTH TWICE A DAY 60 tablet 2  . omeprazole (PRILOSEC) 20 MG capsule Take 20 mg by mouth daily.    Marland Kitchen omeprazole (PRILOSEC) 20 MG capsule TAKE ONE CAPSULE BY MOUTH EVERY DAY 30 capsule 1  . polyethylene glycol powder (MIRALAX) powder Take 17 g by mouth daily. 255 g 0  . Psyllium (EQ FIBER THERAPY) 48.57 % POWD Take 15 g by mouth  daily. 1254 g 3  . triamterene-hydrochlorothiazide (DYAZIDE) 37.5-25 MG per capsule Take 1 capsule by mouth daily.    Marland Kitchen triamterene-hydrochlorothiazide (DYAZIDE) 37.5-25 MG per capsule TAKE ONE CAPSULE BY MOUTH EVERY DAY 90 capsule 0  . Witch Hazel (TUCKS) 50 % PADS Apply 1 application topically 3 (three) times daily as needed. Apply to anorectal area as needed 3 times daily or after bowel movements. 1 each 2  . [DISCONTINUED] Potassium Chloride ER 20 MEQ TBCR Take 20 mEq by mouth 2 (two) times daily. 60 tablet 5   No current facility-administered medications on file prior to visit.    Review of Systems  Constitutional: Negative for fever, chills and appetite change.  Respiratory: Negative for cough and shortness of breath.   Cardiovascular: Negative for chest pain.  Gastrointestinal: Positive for diarrhea and constipation. Negative for nausea, vomiting, abdominal pain and blood in stool.       Responds to miralax as needed.    Genitourinary: Negative for dysuria and difficulty urinating.       Filed Vitals:   06/08/15 1048  BP: 111/71  Pulse: 70  Temp: 98.1 F (36.7 C)  TempSrc: Oral  Height: 5\' 8"  (1.727 m)  Weight: 196  lb 1.6 oz (88.95 kg)  SpO2: 100%    Objective:   Physical Exam  Constitutional: He is oriented to person, place, and time. He appears well-developed. No distress.  HENT:  Head: Normocephalic and atraumatic.  Mouth/Throat: No oropharyngeal exudate.  Eyes: EOM are normal. Pupils are equal, round, and reactive to light.  Neck: Neck supple.  Cardiovascular: Normal rate, regular rhythm and normal heart sounds.  Exam reveals no gallop and no friction rub.   No murmur heard. Pulmonary/Chest: Effort normal and breath sounds normal. No respiratory distress. He has no wheezes. He has no rales.  Abdominal: Soft. Bowel sounds are normal. He exhibits no distension. There is no tenderness. There is no rebound.  Musculoskeletal: Normal range of motion. He exhibits no  edema or tenderness.  Neurological: He is alert and oriented to person, place, and time. No cranial nerve deficit.  Skin: Skin is warm. He is not diaphoretic.  Psychiatric: He has a normal mood and affect.  Vitals reviewed.         Assessment & Plan:  Please see problem based charting for assessment and plan.

## 2015-06-09 ENCOUNTER — Other Ambulatory Visit: Payer: Self-pay | Admitting: Internal Medicine

## 2015-06-10 NOTE — Assessment & Plan Note (Addendum)
Lipid Panel     Component Value Date/Time   CHOL 135 10/19/2014 1213   TRIG 215* 10/19/2014 1213   HDL 32* 10/19/2014 1213   CHOLHDL 4.2 10/19/2014 1213   VLDL 43* 10/19/2014 1213   LDLCALC 60 10/19/2014 1213   ASCVD risk is ~9% based on lipid panel 10/2014.  He would likely benefit more from moderate intensity statin for overall ASCVD risk reduction.  However, his TG were reportedly as high as 764 and have improved significantly on gemfibrozil.  Also, his LDL is only 60.  Will continue gemfibrozil for now.  Will check fasting lipid panel next visit and plan to d/c gemfibrozil and switch to statin if ASCVD risk or LDL significantly increased.

## 2015-06-10 NOTE — Assessment & Plan Note (Signed)
Mom had not been giving this daily in recent months because she thought it was causing diarrhea, which has since resolved.  To resume daily supplement.  BMP next visit.

## 2015-06-10 NOTE — Assessment & Plan Note (Addendum)
Stable on Prolixin.  Managed by mental health provider.

## 2015-06-10 NOTE — Assessment & Plan Note (Signed)
Referral has been made for colonoscopy in the past but realistically it would be difficult given disability.  Will discuss colonoscopy vs CT colon vs FIT testing with his mom next visit.

## 2015-06-10 NOTE — Assessment & Plan Note (Addendum)
Constipation responds to OTC medications.  Mom reports a 3 months of diarrhea (2-3 watery stools/day) that she thought may have been associated with K supplement or gemfibrozil so she started alternating the days she gave these medications.  He has not had diarrhea 3 weeks.   The loose stools may have been due to current bowel regimen but have since resolved.  He has been on K and gemfibrozil for a while without problem and I don't think they are associated.  - continue colace, psyllium and prn miralax since mom says constipation is more predominate than diarrhea and diarrhea has resolved - STOP ferrous sulfate and START niferex (polysaccharide Fe complex) - will see if it is more tolerable, once daily dosing should help improve tolerability

## 2015-06-10 NOTE — Assessment & Plan Note (Addendum)
BP Readings from Last 3 Encounters:  06/08/15 111/71  10/19/14 116/65  08/29/14 115/73    Lab Results  Component Value Date   NA 139 06/08/2015   K 3.1* 06/08/2015   CREATININE 0.86 06/08/2015    Assessment: Blood pressure control:  well controlled Progress toward BP goal:   at goal Comments: Compliant with medication.   Plan: Medications:  continue current medications:  Atenolol 25mg  daily, triamterene-HCTZ 37.5-25mg  daily Other plans: BMP today - hypokalemic.  Mom has not been giving K every day because she thought it may have contributed to diarrhea.  He has not had diarrhea in 3 weeks.  I have advised patient's mom to resume K during our visit (before I knew BMP result).  When BMP returned I tried calling several times but no answer and there was no voicemail setup.  I will attempt to call again.  RTC in 3 months.

## 2015-06-10 NOTE — Assessment & Plan Note (Addendum)
Compliant with TID Fe supplement but has occasional constipation.  Will change TID Fe to Niferex daily to see if this helps with constipation.  CBC next visit.

## 2015-06-10 NOTE — Assessment & Plan Note (Signed)
Stable on Flonase.

## 2015-06-12 NOTE — Progress Notes (Signed)
Medicine attending: Medical history, presenting problems, physical findings, and medications, reviewed with Dr Duwaine Maxin on the day of the patient's visit and I concur with her evaluation and management plan.

## 2015-07-15 ENCOUNTER — Emergency Department (HOSPITAL_COMMUNITY): Payer: Medicare Other

## 2015-07-15 ENCOUNTER — Encounter (HOSPITAL_COMMUNITY): Payer: Self-pay | Admitting: Nurse Practitioner

## 2015-07-15 ENCOUNTER — Emergency Department (HOSPITAL_COMMUNITY)
Admission: EM | Admit: 2015-07-15 | Discharge: 2015-07-15 | Disposition: A | Discharge status: Home / Self Care | Payer: Medicare Other | Attending: Emergency Medicine | Admitting: Emergency Medicine

## 2015-07-15 DIAGNOSIS — E781 Pure hyperglyceridemia: Secondary | ICD-10-CM | POA: Insufficient documentation

## 2015-07-15 DIAGNOSIS — H913 Deaf nonspeaking, not elsewhere classified: Secondary | ICD-10-CM | POA: Insufficient documentation

## 2015-07-15 DIAGNOSIS — R058 Other specified cough: Secondary | ICD-10-CM

## 2015-07-15 DIAGNOSIS — Z79899 Other long term (current) drug therapy: Secondary | ICD-10-CM | POA: Insufficient documentation

## 2015-07-15 DIAGNOSIS — Z8659 Personal history of other mental and behavioral disorders: Secondary | ICD-10-CM | POA: Insufficient documentation

## 2015-07-15 DIAGNOSIS — R05 Cough: Secondary | ICD-10-CM | POA: Diagnosis not present

## 2015-07-15 DIAGNOSIS — K029 Dental caries, unspecified: Secondary | ICD-10-CM | POA: Diagnosis not present

## 2015-07-15 DIAGNOSIS — Z862 Personal history of diseases of the blood and blood-forming organs and certain disorders involving the immune mechanism: Secondary | ICD-10-CM | POA: Diagnosis not present

## 2015-07-15 NOTE — ED Notes (Signed)
Mother states hes had runny nose and dry cough for past week. No pain or fevers. She has not felt well, she was around a sick friend so she is worried he may have caught a cold from her

## 2015-07-15 NOTE — ED Provider Notes (Signed)
CSN: [id number]     Arrival date & time 07/15/15  7628 History   First MD Initiated Contact with Patient 07/15/15 1020     Chief Complaint  Patient presents with  . Cough     (Consider location/radiation/quality/duration/timing/severity/associated sxs/prior Treatment) HPI   Patient is a 59 year old male with history of mental retardation, deaf-mutism, and chronic constipation, who presents to the ER this morning with his mother, who is the primary historian, who states that the patient has had a nonproductive cough which occurred after using nasal saline spray.  He has had no runny nose, congestion, sore throat, ear pain, wheeze, or respiratory distress.  He has had no fevers, chills, sweats or change in appetite or activity level.  He denies abdominal pain, chest pain, LE edema or shortness of breath.  His chronic constipation is unchanged and is maintained with Miralax.   He denies any other sx, no weakness, fatigue, rash,pallor, syncope, change in behavior, mood or activity level.  Past Medical History  Diagnosis Date  . Deaf-mutism   . MR (mental retardation)   . Schizophrenia, paranoid, chronic   . Chronic constipation   . Hypertriglyceridemia   . Dental caries   . Intertrigo   . Umbilical hernia   . History of iron deficiency anemia    History reviewed. No pertinent past surgical history. Family History  Problem Relation Age of Onset  . Diabetes Maternal Aunt   . Hypertension Mother    Social History  Substance Use Topics  . Smoking status: Never Smoker   . Smokeless tobacco: None  . Alcohol Use: No    Review of Systems 10 Systems reviewed and are negative for acute change except as noted in the HPI.      Allergies  Robitussin dm  Home Medications   Prior to Admission medications   Medication Sig Start Date End Date Taking? Authorizing Provider  atenolol (TENORMIN) 25 MG tablet TAKE 1 TABLET BY MOUTH EVERY DAY 05/15/15   Francesca Oman, DO  cetirizine  (ZYRTEC) 10 MG tablet TAKE 1 TABLET BY MOUTH EVERY DAY 05/15/15   Francesca Oman, DO  docusate sodium (COLACE) 100 MG capsule Take 1 capsule (100 mg total) by mouth every 12 (twelve) hours. 10/22/13   Fransico Meadow, PA-C  fluPHENAZine decanoate (PROLIXIN) 25 MG/ML injection Inject 32.5 mg into the muscle every 21 ( twenty-one) days.     Historical Provider, MD  fluticasone (FLONASE) 50 MCG/ACT nasal spray Place 1 spray into both nostrils daily. 02/17/14 02/17/15  Francesca Oman, DO  gemfibrozil (LOPID) 600 MG tablet TAKE 1 TABLET BY MOUTH TWICE A DAY 30 MINUTES BEFORE BREAKFAST AND 30 MINS BEFORE DINNER 06/09/15   Francesca Oman, DO  hydrocortisone (ANUSOL-HC) 2.5 % rectal cream Apply rectally 2 times daily 10/22/13   Fransico Meadow, PA-C  iron polysaccharides (NIFEREX) 150 MG capsule Take 1 capsule (150 mg total) by mouth daily. 06/08/15   Francesca Oman, DO  omeprazole (PRILOSEC) 20 MG capsule TAKE ONE CAPSULE BY MOUTH EVERY DAY 01/12/15   Francesca Oman, DO  polyethylene glycol powder (MIRALAX) powder Take 17 g by mouth daily. 08/11/13   Francesca Oman, DO  potassium chloride SA (KLOR-CON M20) 20 MEQ tablet Take 2 tablets (40 mEq total) by mouth daily. 06/09/15   Francesca Oman, DO  Psyllium (EQ FIBER THERAPY) 48.57 % POWD Take 15 g by mouth daily. 10/31/11   Janell Quiet, MD  triamterene-hydrochlorothiazide (DYAZIDE) 37.5-25 MG per  capsule TAKE ONE CAPSULE BY MOUTH EVERY DAY 04/19/15   Francesca Oman, DO  Witch Hazel (TUCKS) 50 % PADS Apply 1 application topically 3 (three) times daily as needed. Apply to anorectal area as needed 3 times daily or after bowel movements. 08/11/13   Francesca Oman, DO   BP 105/67 mmHg  Pulse 87  Temp(Src) 98.1 F (36.7 C) (Oral)  Resp 17  SpO2 95% Physical Exam  Constitutional: Vital signs are normal. He appears well-developed and well-nourished. He is cooperative.  Non-toxic appearance. No distress.  HENT:  Head: Normocephalic and atraumatic.  Right Ear: External ear and ear  canal normal. No drainage, swelling or tenderness. No mastoid tenderness.  Left Ear: External ear and ear canal normal. No drainage, swelling or tenderness. No mastoid tenderness.  Nose: Nose normal. No mucosal edema, rhinorrhea or sinus tenderness. Right sinus exhibits no maxillary sinus tenderness and no frontal sinus tenderness. Left sinus exhibits no maxillary sinus tenderness and no frontal sinus tenderness.  Mouth/Throat: Uvula is midline and mucous membranes are normal. Mucous membranes are not pale, not dry and not cyanotic. No oral lesions. No trismus in the jaw. Dental caries present. No uvula swelling. No oropharyngeal exudate, posterior oropharyngeal edema, posterior oropharyngeal erythema or tonsillar abscesses.  Eyes: Conjunctivae and EOM are normal. Pupils are equal, round, and reactive to light. Right eye exhibits no discharge. Left eye exhibits no discharge. No scleral icterus.  Neck: Normal range of motion and full passive range of motion without pain. Neck supple. No JVD present. No muscular tenderness present. No rigidity. No tracheal deviation, no edema, no erythema and normal range of motion present. No thyromegaly present.  Cardiovascular: Normal rate, regular rhythm, normal heart sounds and intact distal pulses.  Exam reveals no gallop and no friction rub.   No murmur heard. Pulmonary/Chest: Effort normal and breath sounds normal. No respiratory distress. He has no wheezes. He has no rales. He exhibits no tenderness.  Poor inspiratory effort, decreased breath sounds at the base bilaterally, clear in mid to upper lung fields bilaterally, no wheeze, crackles, or rhonchi No accessory muscle use, tachypnea or cough  Abdominal: Soft. Bowel sounds are normal. He exhibits no distension and no mass. There is no tenderness. There is no rebound and no guarding.  Abdomen obese, soft, nondistended, normal bowel sounds 4, no tenderness to palpation, no guarding or rebound  Musculoskeletal:  Normal range of motion. He exhibits no edema.  Lymphadenopathy:       Head (right side): No submental, no submandibular, no tonsillar, no preauricular and no posterior auricular adenopathy present.       Head (left side): No submental, no submandibular, no tonsillar, no preauricular and no posterior auricular adenopathy present.    He has no cervical adenopathy.  Neurological: He is alert. He has normal reflexes. No cranial nerve deficit. He exhibits normal muscle tone. Coordination and gait normal.  Skin: Skin is warm and dry. No rash noted. He is not diaphoretic. No erythema. No pallor.  Psychiatric: He has a normal mood and affect. His behavior is normal. Judgment and thought content normal.  Nursing note and vitals reviewed.   ED Course  Procedures (including critical care time) Labs Review Labs Reviewed - No data to display  Imaging Review Dg Chest 2 View  07/15/2015   CLINICAL DATA:  Cough, runny nose  EXAM: CHEST  2 VIEW  COMPARISON:  03/11/2013  FINDINGS: Cardiomediastinal silhouette is stable. There is chronic elevation of the left hemidiaphragm. Bilateral  basilar atelectasis. Mild infrahilar bronchitic changes. No pulmonary edema.  IMPRESSION: Bilateral basilar atelectasis. Chronic elevation of the right hemidiaphragm. Mild infrahilar bronchitic changes.   Electronically Signed   By: Lahoma Crocker M.D.   On: 07/15/2015 11:11   I, Delsa Grana, personally reviewed and evaluated these images and lab results as part of my medical decision-making.   EKG Interpretation None      MDM   Final diagnoses:  None    Pt with cough after using nasal spray No signs of URI, bronchitis or PNA on exam, I have not observed the pt cough at all, he has normal vitals, is well appearing.  CXR to r/o infiltrate, since hx is limited by pt condition.  Suspect he was just clearing nasal spray that drained down the back of throat, and that there is no acute pathology to treat.  CXR shows bibailar  atelectasis and chronic elevation of the right hemidaiphragm, when compared to prior CXR from 2014, similar low lung volumes and bibasilar atelectasis present, no acute change, no pulm edema or aveolar infiltrate.  With be discharged home with f/up with PCP.   Delsa Grana, PA-C 07/15/15 [place], MD 07/16/15 [phone]

## 2015-07-15 NOTE — ED Notes (Signed)
Pt to xray

## 2015-07-15 NOTE — ED Notes (Signed)
Turkey sandwich and sprite given to pt. 

## 2015-07-15 NOTE — Discharge Instructions (Signed)
Cough, Adult   A cough is a reflex. It helps you clear your throat and airways. A cough can help heal your body. A cough can last 2 or 3 weeks (acute) or may last more than 8 weeks (chronic). Some common causes of a cough can include an infection, allergy, or a cold.  HOME CARE  · Only take medicine as told by your doctor.  · If given, take your medicines (antibiotics) as told. Finish them even if you start to feel better.  · Use a cold steam vaporizer or humidifier in your home. This can help loosen thick spit (secretions).  · Sleep so you are almost sitting up (semi-upright). Use pillows to do this. This helps reduce coughing.  · Rest as needed.  · Stop smoking if you smoke.  GET HELP RIGHT AWAY IF:  · You have yellowish-white fluid (pus) in your thick spit.  · Your cough gets worse.  · Your medicine does not reduce coughing, and you are losing sleep.  · You cough up blood.  · You have trouble breathing.  · Your pain gets worse and medicine does not help.  · You have a fever.  MAKE SURE YOU:   · Understand these instructions.  · Will watch your condition.  · Will get help right away if you are not doing well or get worse.  Document Released: 08/01/2011 Document Revised: 04/04/2014 Document Reviewed: 08/01/2011  Exit[employer]® Patient Information ©2015 [employer]. This information is not intended to replace advice given to you by your health care provider. Make sure you discuss any questions you have with your health care provider.

## 2015-07-25 DIAGNOSIS — F209 Schizophrenia, unspecified: Secondary | ICD-10-CM | POA: Diagnosis not present

## 2015-08-04 ENCOUNTER — Other Ambulatory Visit: Payer: Self-pay | Admitting: *Deleted

## 2015-08-04 NOTE — Telephone Encounter (Signed)
Pharmacy is requesting 90 day supply on both meds. Hilda Blades Kalif Kattner RN 08/04/15 3:50PM

## 2015-08-09 ENCOUNTER — Other Ambulatory Visit: Payer: Self-pay | Admitting: Internal Medicine

## 2015-08-14 NOTE — Telephone Encounter (Signed)
Message sent to front desk pool regarding an appt as outline per Dr Redmond Pulling.

## 2015-09-13 ENCOUNTER — Ambulatory Visit (INDEPENDENT_AMBULATORY_CARE_PROVIDER_SITE_OTHER): Payer: Medicare Other | Admitting: Internal Medicine

## 2015-09-13 ENCOUNTER — Encounter: Payer: Self-pay | Admitting: Internal Medicine

## 2015-09-13 VITALS — BP 126/75 | HR 72 | Temp 97.9°F | Wt 195.3 lb

## 2015-09-13 DIAGNOSIS — Z23 Encounter for immunization: Secondary | ICD-10-CM | POA: Diagnosis present

## 2015-09-14 NOTE — Progress Notes (Signed)
   Subjective:    Patient ID: Leonard Tucker, male    DOB: December 04, 1955, 59 y.o.   MRN: [id number]  HPI Comments: Patient's mother had to leave and he was not seen.  Mother reported no acute issues.  Will have patient rescheduled.  Flu shot given today.     Review of Systems     Objective:   Physical Exam        Assessment & Plan:

## 2015-09-18 ENCOUNTER — Other Ambulatory Visit: Payer: Self-pay | Admitting: Internal Medicine

## 2015-09-18 DIAGNOSIS — E781 Pure hyperglyceridemia: Secondary | ICD-10-CM

## 2015-09-18 DIAGNOSIS — I10 Essential (primary) hypertension: Secondary | ICD-10-CM

## 2015-09-18 NOTE — Telephone Encounter (Signed)
Called pt's mother x 2 - no answer; left message he needs an early morning fasting lipid and to keep 11/9 appt and to call to schedule the lab appt.

## 2015-09-18 NOTE — Progress Notes (Signed)
Internal Medicine Clinic Attending  Case discussed with Dr. Wilson soon after the resident saw the patient.  We reviewed the resident's history and exam and pertinent patient test results.  I agree with the assessment, diagnosis, and plan of care documented in the resident's note.  

## 2015-09-19 NOTE — Telephone Encounter (Signed)
Pt's mother called back - lab appt scheduled for Wed.

## 2015-09-20 ENCOUNTER — Other Ambulatory Visit (INDEPENDENT_AMBULATORY_CARE_PROVIDER_SITE_OTHER): Payer: Medicare Other

## 2015-09-20 DIAGNOSIS — E781 Pure hyperglyceridemia: Secondary | ICD-10-CM

## 2015-09-20 DIAGNOSIS — I10 Essential (primary) hypertension: Secondary | ICD-10-CM | POA: Diagnosis not present

## 2015-09-21 LAB — LIPID PANEL
CHOLESTEROL TOTAL: 148 mg/dL (ref 100–199)
Chol/HDL Ratio: 3.8 ratio units (ref 0.0–5.0)
HDL: 39 mg/dL — ABNORMAL LOW (ref >39)
LDL CALC: 86 mg/dL (ref 0–99)
Triglycerides: 115 mg/dL (ref 0–149)
VLDL Cholesterol Cal: 23 mg/dL (ref 5–40)

## 2015-09-21 LAB — CMP14 + ANION GAP
ALBUMIN: 4.7 g/dL (ref 3.5–5.5)
ALK PHOS: 74 IU/L (ref 39–117)
ALT: 19 IU/L (ref 0–44)
AST: 23 IU/L (ref 0–40)
Albumin/Globulin Ratio: 1.3 (ref 1.1–2.5)
Anion Gap: 23 mmol/L — ABNORMAL HIGH (ref 10.0–18.0)
BUN / CREAT RATIO: 13 (ref 9–20)
BUN: 12 mg/dL (ref 6–24)
Bilirubin Total: 0.5 mg/dL (ref 0.0–1.2)
CO2: 25 mmol/L (ref 18–29)
CREATININE: 0.93 mg/dL (ref 0.76–1.27)
Calcium: 9.6 mg/dL (ref 8.7–10.2)
Chloride: 94 mmol/L — ABNORMAL LOW (ref 97–106)
GFR calc non Af Amer: 91 mL/min/{1.73_m2} (ref >59)
GFR, EST AFRICAN AMERICAN: 105 mL/min/{1.73_m2} (ref >59)
GLOBULIN, TOTAL: 3.5 g/dL (ref 1.5–4.5)
Glucose: 99 mg/dL (ref 65–99)
Potassium: 3.3 mmol/L — ABNORMAL LOW (ref 3.5–5.2)
Sodium: 142 mmol/L (ref 136–144)
TOTAL PROTEIN: 8.2 g/dL (ref 6.0–8.5)

## 2015-10-11 ENCOUNTER — Ambulatory Visit (INDEPENDENT_AMBULATORY_CARE_PROVIDER_SITE_OTHER): Payer: Medicare Other | Admitting: Internal Medicine

## 2015-10-11 ENCOUNTER — Encounter: Payer: Self-pay | Admitting: Internal Medicine

## 2015-10-11 VITALS — BP 114/64 | HR 72 | Temp 97.9°F | Ht 68.0 in | Wt 196.1 lb

## 2015-10-11 DIAGNOSIS — J309 Allergic rhinitis, unspecified: Secondary | ICD-10-CM | POA: Diagnosis not present

## 2015-10-11 DIAGNOSIS — K5900 Constipation, unspecified: Secondary | ICD-10-CM | POA: Diagnosis not present

## 2015-10-11 DIAGNOSIS — I10 Essential (primary) hypertension: Secondary | ICD-10-CM | POA: Diagnosis present

## 2015-10-11 DIAGNOSIS — E785 Hyperlipidemia, unspecified: Secondary | ICD-10-CM | POA: Diagnosis not present

## 2015-10-11 DIAGNOSIS — D500 Iron deficiency anemia secondary to blood loss (chronic): Secondary | ICD-10-CM

## 2015-10-11 DIAGNOSIS — F200 Paranoid schizophrenia: Secondary | ICD-10-CM | POA: Diagnosis not present

## 2015-10-11 DIAGNOSIS — Z79899 Other long term (current) drug therapy: Secondary | ICD-10-CM | POA: Diagnosis not present

## 2015-10-11 DIAGNOSIS — E876 Hypokalemia: Secondary | ICD-10-CM | POA: Diagnosis not present

## 2015-10-11 MED ORDER — POLYETHYLENE GLYCOL 3350 17 GM/SCOOP PO POWD: 17.0000 g | Freq: Every day | ORAL | Status: DC

## 2015-10-11 MED ORDER — ATORVASTATIN CALCIUM 10 MG PO TABS
10.0000 mg | ORAL_TABLET | Freq: Every day | ORAL | Status: DC
Start: 2015-10-11 — End: 2015-12-12

## 2015-10-11 NOTE — Progress Notes (Signed)
Subjective:    Patient ID: Leonard Tucker, male    DOB: 02-May-1956, 59 y.o.   MRN: [id number]  HPI Comments: Leonard Tucker is a 59 year old male with PMH as below here for follow-up of HTN and HLD.  Please see problem based charting for status of his chronic medical conditions.    Past Medical History  Diagnosis Date  . Deaf-mutism   . MR (mental retardation)   . Schizophrenia, paranoid, chronic ([place])   . Chronic constipation   . Hypertriglyceridemia   . Dental caries   . Intertrigo   . Umbilical hernia   . History of iron deficiency anemia    [facility] Prescriptions on File Prior to Visit  Medication Sig Dispense Refill  . atenolol (TENORMIN) 25 MG tablet TAKE 1 TABLET BY MOUTH EVERY DAY 90 tablet 1  . cetirizine (ZYRTEC) 10 MG tablet TAKE 1 TABLET BY MOUTH EVERY DAY 30 tablet 3  . docusate sodium (COLACE) 100 MG capsule Take 1 capsule (100 mg total) by mouth every 12 (twelve) hours. 60 capsule 0  . fluPHENAZine decanoate (PROLIXIN) 25 MG/ML injection Inject 32.5 mg into the muscle every 21 ( twenty-one) days.     . fluticasone (FLONASE) 50 MCG/ACT nasal spray Place 1 spray into both nostrils daily. 16 g 2  . gemfibrozil (LOPID) 600 MG tablet TAKE 1 TABLET BY MOUTH TWICE A DAY 30 MINUTES BEFORE BREAKFAST AND 30 MINS BEFORE DINNER 60 tablet 0  . hydrocortisone (ANUSOL-HC) 2.5 % rectal cream Apply rectally 2 times daily 30 g 1  . iron polysaccharides (NIFEREX) 150 MG capsule Take 1 capsule (150 mg total) by mouth daily. 30 capsule 3  . KLOR-CON M20 20 MEQ tablet TAKE 2 TABLETS (40 MEQ TOTAL) BY MOUTH DAILY. 60 tablet 0  . omeprazole (PRILOSEC) 20 MG capsule TAKE ONE CAPSULE BY MOUTH EVERY DAY 30 capsule 1  . polyethylene glycol powder (MIRALAX) powder Take 17 g by mouth daily. 255 g 0  . Psyllium (EQ FIBER THERAPY) 48.57 % POWD Take 15 g by mouth daily. 1254 g 3  . triamterene-hydrochlorothiazide (DYAZIDE) 37.5-25 MG per capsule TAKE ONE CAPSULE BY MOUTH EVERY DAY 90 capsule 1    . Witch Hazel (TUCKS) 50 % PADS Apply 1 application topically 3 (three) times daily as needed. Apply to anorectal area as needed 3 times daily or after bowel movements. 1 each 2  . [DISCONTINUED] Potassium Chloride ER 20 MEQ TBCR Take 20 mEq by mouth 2 (two) times daily. 60 tablet 5   No Tucker facility-administered medications on file prior to visit.    Review of Systems  Constitutional: Negative for chills, appetite change and unexpected weight change.  HENT: Negative for sneezing.   Respiratory: Positive for cough. Negative for shortness of breath.        X 1 week, improving per mom and she says also had a cold.  Cardiovascular: Negative for chest pain and leg swelling.  Gastrointestinal: Negative for nausea, vomiting, abdominal pain, diarrhea, constipation and blood in stool.  Genitourinary: Negative for difficulty urinating.  Neurological: Negative for syncope.  Psychiatric/Behavioral: Negative for behavioral problems and dysphoric mood.       Filed Vitals:   10/11/15 1446  BP: 114/64  Pulse: 72  Temp: 97.9 F (36.6 C)  TempSrc: Oral  Height: 5\' 8"  (1.727 m)  Weight: 196 lb 1.6 oz (88.95 kg)  SpO2: 100%     Objective:   Physical Exam  Constitutional: He is oriented to  person, place, and time. He appears well-developed. No distress.  HENT:  Head: Normocephalic and atraumatic.  Mouth/Throat: Oropharynx is clear and moist. No oropharyngeal exudate.  Eyes: EOM are normal. Pupils are equal, round, and reactive to light.  Neck: Neck supple.  Cardiovascular: Normal rate, regular rhythm and normal heart sounds.  Exam reveals no gallop and no friction rub.   No murmur heard. Pulmonary/Chest: Effort normal and breath sounds normal. No respiratory distress. He has no wheezes. He has no rales.  Abdominal: Soft. Bowel sounds are normal.  Musculoskeletal: Normal range of motion. He exhibits no edema or tenderness.  Neurological: He is alert and oriented to person, place, and  time. No cranial nerve deficit.  Skin: Skin is warm. He is not diaphoretic.  Psychiatric: He has a normal mood and affect. His behavior is normal. Judgment and thought content normal.  Vitals reviewed.         Assessment & Plan:  Please see problem based charting for A&P.

## 2015-10-11 NOTE — Patient Instructions (Signed)
1. I am stopping your medication called gemfibrozil.  I am starting you on atorvastatin instead.   2. Please take all medications as prescribed.    3. If you have worsening of your symptoms or new symptoms arise, please call the clinic ([phone]), or go to the ER immediately if symptoms are severe.   Please come back to see me in 3 months for follow-up.

## 2015-10-11 NOTE — Assessment & Plan Note (Signed)
BP Readings from Last 3 Encounters:  10/11/15 114/64  09/13/15 126/75  07/15/15 122/75    Lab Results  Component Value Date   NA 142 09/20/2015   K 3.3* 09/20/2015   CREATININE 0.93 09/20/2015    Assessment: Blood pressure control:  well controlled Progress toward BP goal:   at goal Comments: Compliant with medications.  Mildly hypokalemic.  Plan: Medications:  continue current medications:  Atenolol 25mg , triamterene-HCTZ 37.5-25mg  daily Educational resources provided:   Self management tools provided:   Other plans:  BMP today.  RTC in 3 months.

## 2015-10-11 NOTE — Assessment & Plan Note (Addendum)
Compliant with flonase, cetirizine.

## 2015-10-12 LAB — BMP8+ANION GAP
ANION GAP: 19 mmol/L — AB (ref 10.0–18.0)
BUN/Creatinine Ratio: 12 (ref 9–20)
BUN: 11 mg/dL (ref 6–24)
CO2: 25 mmol/L (ref 18–29)
Calcium: 10.2 mg/dL (ref 8.7–10.2)
Chloride: 96 mmol/L — ABNORMAL LOW (ref 97–106)
Creatinine, Ser: 0.93 mg/dL (ref 0.76–1.27)
GFR calc Af Amer: 105 mL/min/{1.73_m2} (ref >59)
GFR calc non Af Amer: 91 mL/min/{1.73_m2} (ref >59)
Glucose: 85 mg/dL (ref 65–99)
Potassium: 3.5 mmol/L (ref 3.5–5.2)
Sodium: 140 mmol/L (ref 136–144)

## 2015-10-12 NOTE — Assessment & Plan Note (Addendum)
Assessment:  Compliant with daily Niferex 150mg  and mom says she thinks this has been better for his GI.  No gross blood loss noted, no dark/bloody stools, no fatigue, dyspnea. Plan:  Continue Niferex 150mg  daily, bowel regimen.

## 2015-10-12 NOTE — Assessment & Plan Note (Signed)
Assessment:  Recent TG 115, improved from 300s prior to gemfibrozil.  His ASCVD is 9.5% and he would likely benefit from statin therapy for risk reduction.  Gemfibrozil would not offer this same benefit.  Cost vs. Benefit discussed with his mom.  She is aware of potential for myalgia or LFT abnormalities which we can monitor for.  She would like like to proceed with statin therapy and agrees to stop gemfibrozil (she said she preferred to leave the bottle with me to discard). Plan: STOP gemfibrozil.  START Lipitor 10mg  daily.  She will let me know if he has muscle pain or any problems after starting.  Gemfibrozil was discarded in clinic disposal.

## 2015-10-12 NOTE — Assessment & Plan Note (Signed)
Assessment:  His mom says the constipation is not so bad right now.  She rarely needs to give him Miralax and the last refill was quite a while ago.  She is requesting a refill. Plan:  Continue to monitor.  Watch diet.  Continue Niferex (mom says she does feel this has been easier for his GI than TID FeSulfate was).  Miralax refill sent.

## 2015-10-12 NOTE — Assessment & Plan Note (Signed)
Assessment:  Mood stable on Prolixin q21days. Plan:  Continue management by mental health provider.

## 2015-10-12 NOTE — Assessment & Plan Note (Signed)
BMP Latest Ref Rng 10/11/2015 09/20/2015 06/08/2015  Glucose 65 - 99 mg/dL 85 99 93  BUN 6 - 24 mg/dL 11 12 11   Creatinine 0.76 - 1.27 mg/dL 0.93 0.93 0.86  BUN/Creat Ratio 9 - 20 12 13  -  Sodium 136 - 144 mmol/L 140 142 139  Potassium 3.5 - 5.2 mmol/L 3.5 3.3(L) 3.1(L)  Chloride 97 - 106 mmol/L 96(L) 94(L) 98  CO2 18 - 29 mmol/L 25 25 27   Calcium 8.7 - 10.2 mg/dL 10.2 9.6 9.5   Assessment:  Hypokalemic last visit but had not been compliant with the K at that time.  Now back on 50mEq daily for past 3 months and triamterene with his HCTZ. Plan:  BMP today - K 3.5.  Will continue 79mEq supplement.

## 2015-10-16 ENCOUNTER — Telehealth: Payer: Self-pay | Admitting: Internal Medicine

## 2015-10-16 NOTE — Telephone Encounter (Signed)
Pt mother requesting lab result.

## 2015-10-16 NOTE — Telephone Encounter (Signed)
Dr Redmond Pulling, pt's mother wants lab results please call her at [phone]

## 2015-10-18 NOTE — Telephone Encounter (Signed)
I spoke with Mr. Tull mom, Kallon Eyer.  I informed her of lab results and no need for change in therapy at this time.

## 2015-10-23 ENCOUNTER — Other Ambulatory Visit: Payer: Self-pay | Admitting: Internal Medicine

## 2015-10-23 NOTE — Progress Notes (Signed)
Case discussed with Dr. Wilson soon after the resident saw the patient. We reviewed the resident's history and exam and pertinent patient test results. I agree with the assessment, diagnosis, and plan of care documented in the resident's note. 

## 2015-11-03 ENCOUNTER — Other Ambulatory Visit: Payer: Self-pay | Admitting: Internal Medicine

## 2015-11-11 ENCOUNTER — Other Ambulatory Visit: Payer: Self-pay | Admitting: Internal Medicine

## 2015-11-13 NOTE — Telephone Encounter (Signed)
Called pharm, spoke w/ pharm, gemfibrozil is d/cd

## 2015-12-12 ENCOUNTER — Other Ambulatory Visit: Payer: Self-pay | Admitting: Internal Medicine

## 2015-12-27 ENCOUNTER — Encounter: Payer: Medicare Other | Admitting: Internal Medicine

## 2015-12-28 ENCOUNTER — Telehealth: Payer: Self-pay | Admitting: Internal Medicine

## 2015-12-28 NOTE — Telephone Encounter (Signed)
Call to patient to confirm appointment for 12/29/15 at  9:15 lmtcb

## 2015-12-29 ENCOUNTER — Encounter: Payer: Self-pay | Admitting: Internal Medicine

## 2015-12-29 ENCOUNTER — Ambulatory Visit (INDEPENDENT_AMBULATORY_CARE_PROVIDER_SITE_OTHER): Payer: Medicare Other | Admitting: Internal Medicine

## 2015-12-29 VITALS — BP 111/76 | HR 66 | Temp 97.5°F | Ht 68.0 in | Wt 191.7 lb

## 2015-12-29 DIAGNOSIS — J309 Allergic rhinitis, unspecified: Secondary | ICD-10-CM | POA: Diagnosis not present

## 2015-12-29 DIAGNOSIS — E785 Hyperlipidemia, unspecified: Secondary | ICD-10-CM

## 2015-12-29 DIAGNOSIS — K5900 Constipation, unspecified: Secondary | ICD-10-CM

## 2015-12-29 DIAGNOSIS — E876 Hypokalemia: Secondary | ICD-10-CM

## 2015-12-29 DIAGNOSIS — Z1211 Encounter for screening for malignant neoplasm of colon: Secondary | ICD-10-CM

## 2015-12-29 DIAGNOSIS — I10 Essential (primary) hypertension: Secondary | ICD-10-CM

## 2015-12-29 DIAGNOSIS — K219 Gastro-esophageal reflux disease without esophagitis: Secondary | ICD-10-CM

## 2015-12-29 MED ORDER — CETIRIZINE HCL 10 MG PO TABS: 10.0000 mg | ORAL_TABLET | Freq: Every day | ORAL | Status: DC

## 2015-12-29 MED ORDER — FLUTICASONE PROPIONATE 50 MCG/ACT NA SUSP: 2.0000 | Freq: Every day | NASAL | Status: DC

## 2015-12-29 NOTE — Progress Notes (Signed)
Subjective:    Patient ID: Leonard Tucker, male    DOB: March 01, 1956, 60 y.o.   MRN: [id number]  HPI Comments: Leonard Tucker is a 60 year old male with PMH as below here for follow-up of HTN.  Please see problem based charting for status of chronic conditions.     Past Medical History  Diagnosis Date  . Deaf-mutism   . MR (mental retardation)   . Schizophrenia, paranoid, chronic ([place])   . Chronic constipation   . Hypertriglyceridemia   . Dental caries   . Intertrigo   . Umbilical hernia   . History of iron deficiency anemia    Current Outpatient Prescriptions on File Prior to Visit  Medication Sig Dispense Refill  . atenolol (TENORMIN) 25 MG tablet TAKE 1 TABLET BY MOUTH EVERY DAY 90 tablet 1  . atorvastatin (LIPITOR) 10 MG tablet TAKE 1 TABLET (10 MG TOTAL) BY MOUTH DAILY. 30 tablet 5  . cetirizine (ZYRTEC) 10 MG tablet TAKE 1 TABLET BY MOUTH EVERY DAY 30 tablet 3  . docusate sodium (COLACE) 100 MG capsule Take 1 capsule (100 mg total) by mouth every 12 (twelve) hours. 60 capsule 0  . FERREX 150 150 MG capsule TAKE ONE CAPSULE BY MOUTH EVERY DAY 30 capsule 3  . fluPHENAZine decanoate (PROLIXIN) 25 MG/ML injection Inject 32.5 mg into the muscle every 21 ( twenty-one) days.     . fluticasone (FLONASE) 50 MCG/ACT nasal spray Place 1 spray into both nostrils daily. 16 g 2  . hydrocortisone (ANUSOL-HC) 2.5 % rectal cream Apply rectally 2 times daily 30 g 1  . KLOR-CON M20 20 MEQ tablet TAKE 2 TABLETS (40 MEQ TOTAL) BY MOUTH DAILY. 60 tablet 3  . omeprazole (PRILOSEC) 20 MG capsule TAKE ONE CAPSULE BY MOUTH EVERY DAY 30 capsule 1  . polyethylene glycol powder (MIRALAX) powder Take 17 g by mouth daily. 255 g 0  . triamterene-hydrochlorothiazide (DYAZIDE) 37.5-25 MG per capsule TAKE ONE CAPSULE BY MOUTH EVERY DAY 90 capsule 1  . Witch Hazel (TUCKS) 50 % PADS Apply 1 application topically 3 (three) times daily as needed. Apply to anorectal area as needed 3 times daily or after bowel movements.  (Patient not taking: Reported on 10/11/2015) 1 each 2  . [DISCONTINUED] Potassium Chloride ER 20 MEQ TBCR Take 20 mEq by mouth 2 (two) times daily. 60 tablet 5   No current facility-administered medications on file prior to visit.    Review of Systems  HENT: Positive for rhinorrhea. Negative for ear pain.        Clear rhinorrhea.  Respiratory: Positive for cough. Negative for shortness of breath and wheezing.        Productive of clear sputum at times.   Cardiovascular: Negative for chest pain.  Gastrointestinal: Positive for blood in stool. Negative for vomiting, abdominal pain, diarrhea and constipation.       Few drops of red blood in commode after straining to have bowel movement.  Less constipation with Niferex.  Genitourinary: Negative for dysuria, hematuria and difficulty urinating.  Psychiatric/Behavioral: Negative for behavioral problems.       Filed Vitals:   12/29/15 0944  BP: 111/76  Pulse: 66  Temp: 97.5 F (36.4 C)  TempSrc: Oral  Height: 5\' 8"  (1.727 m)  Weight: 191 lb 11.2 oz (86.955 kg)  SpO2: 100%     Objective:   Physical Exam  Constitutional: He is oriented to person, place, and time. He appears well-developed. No distress.  HENT:  Head:  Normocephalic and atraumatic.  Mouth/Throat: Oropharynx is clear and moist. No oropharyngeal exudate.  Eyes: Conjunctivae and EOM are normal. Pupils are equal, round, and reactive to light. Right eye exhibits no discharge. Left eye exhibits no discharge. No scleral icterus.  Neck: Neck supple.  Cardiovascular: Normal rate, regular rhythm and normal heart sounds.  Exam reveals no gallop and no friction rub.   No murmur heard. Pulmonary/Chest: Effort normal and breath sounds normal. No respiratory distress. He has no wheezes. He has no rales.  Abdominal: Soft. Bowel sounds are normal. He exhibits no distension. There is no tenderness. There is no rebound and no guarding.  Musculoskeletal: Normal range of motion. He exhibits  no edema or tenderness.  Upper/lower ext strength and sensation grossly intact and equal.  Neurological: He is alert and oriented to person, place, and time. No cranial nerve deficit.  Skin: Skin is warm. He is not diaphoretic.  Psychiatric: His behavior is normal.  Vitals reviewed.         Assessment & Plan:  Please see problem based charting for A&P.

## 2015-12-29 NOTE — Patient Instructions (Signed)
1. I am glad your constipation is better.  For your hemorrhoids you can use Tucks Pads as needed.  You can also use the Anusol cream for 1-2 weeks while you have symptoms.     2. Please take all medications as prescribed.    3. If you have worsening of your symptoms or new symptoms arise, please call the clinic [phone]), or go to the ER immediately if symptoms are severe.   Please come back to see me in June 2017.

## 2015-12-31 MED ORDER — HYDROCORTISONE 2.5 % RE CREA: TOPICAL_CREAM | RECTAL | Status: DC

## 2015-12-31 MED ORDER — POLYETHYLENE GLYCOL 3350 17 GM/SCOOP PO POWD: 17.0000 g | Freq: Every day | ORAL | Status: DC | PRN

## 2015-12-31 MED ORDER — DOCUSATE SODIUM 100 MG PO CAPS: 100.0000 mg | ORAL_CAPSULE | Freq: Two times a day (BID) | ORAL | Status: DC | PRN

## 2015-12-31 MED ORDER — POLYSACCHARIDE IRON COMPLEX 150 MG PO CAPS: 150.0000 mg | ORAL_CAPSULE | Freq: Every day | ORAL | Status: DC

## 2015-12-31 MED ORDER — TRIAMTERENE-HCTZ 37.5-25 MG PO CAPS: 1.0000 | ORAL_CAPSULE | Freq: Every day | ORAL | Status: DC

## 2015-12-31 MED ORDER — POTASSIUM CHLORIDE CRYS ER 20 MEQ PO TBCR: EXTENDED_RELEASE_TABLET | ORAL | Status: DC

## 2015-12-31 NOTE — Assessment & Plan Note (Addendum)
Assessment:  Mom reports they are both getting over colds.  Leonard Tucker w/ cough w/ clear sputum and clear rhinorrhea.  VSS.  Symptoms likely due to resolving viral URI and some allergy component.   Mom requesting flonase be refilled (he has been out of flonase). Plan:   Flonase and Zyrtec refilled.

## 2015-12-31 NOTE — Assessment & Plan Note (Signed)
Assessment:  Mom reports this is doing better.  She did note a few drops of blood in the toilet after he had been straining but overall having fewer bouts with constipation since they went to visit his sister (reportedly cooks differently at her house).  Mom also feels the Niferex has been less constipating the the prior Fe formulation had been.   Plan:  Continue colace, miralax as needed.  Can use Anusol for short term hemorrhoid relief (1-2 weeks at a time), also Tucks pads prn.

## 2015-12-31 NOTE — Assessment & Plan Note (Signed)
BP Readings from Last 3 Encounters:  12/29/15 111/76  10/11/15 114/64  09/13/15 126/75    Lab Results  Component Value Date   NA 140 10/11/2015   K 3.5 10/11/2015   CREATININE 0.93 10/11/2015    Assessment: Blood pressure control:  well controlled Progress toward BP goal:   at goal Comments: Compliant with medications (mom administers them).  No ADRs.  Plan: Medications:  continue current medications:  Atenolol 25mg  daily, triamteren-HCTZ 37.5-25mg  daily. Educational resources provided: brochure (denies) Self management tools provided:   Other plans:  Follow-up in June 2017.

## 2015-12-31 NOTE — Assessment & Plan Note (Signed)
Assessment:  Compliant with Lipitor 10mg  daily.  No ADRs. Plan:  Continue Lipitor 10mg  daily.

## 2015-12-31 NOTE — Assessment & Plan Note (Signed)
Assessment:  K ok two months ago.  He is on Triamterene and K supplement. Plan:  Continue triamterene and K supp.  Check K next visit.

## 2015-12-31 NOTE — Assessment & Plan Note (Signed)
Assessment:  Mom says he has not complained of reflux symptoms despite running out of PPI. Plan:  PPI removed from med list.  Will monitor for symptom recurrence.

## 2015-12-31 NOTE — Assessment & Plan Note (Signed)
Assessment:  It would be difficult for him to undergo prep and colonoscopy given his disability.  Mom recently noticed blood in stool and attributes this to hemorrhoids.  This may be the case, but given his age he still needs screening. Plan:  Did not get to discuss during visit but I will plan to call mom and ask about CT colon vs FIT testing.

## 2016-01-03 NOTE — Progress Notes (Signed)
Internal Medicine Clinic Attending  Case discussed with Dr. Wilson soon after the resident saw the patient.  We reviewed the resident's history and exam and pertinent patient test results.  I agree with the assessment, diagnosis, and plan of care documented in the resident's note.  

## 2016-01-17 DIAGNOSIS — F209 Schizophrenia, unspecified: Secondary | ICD-10-CM | POA: Diagnosis not present

## 2016-04-11 ENCOUNTER — Other Ambulatory Visit: Payer: Self-pay | Admitting: *Deleted

## 2016-04-22 ENCOUNTER — Other Ambulatory Visit: Payer: Self-pay

## 2016-04-23 MED ORDER — POTASSIUM CHLORIDE CRYS ER 20 MEQ PO TBCR
EXTENDED_RELEASE_TABLET | ORAL | Status: DC
Start: 2016-04-23 — End: 2016-05-07

## 2016-05-06 ENCOUNTER — Encounter: Payer: Self-pay | Admitting: Internal Medicine

## 2016-05-06 ENCOUNTER — Ambulatory Visit (INDEPENDENT_AMBULATORY_CARE_PROVIDER_SITE_OTHER): Payer: Medicare Other | Admitting: Internal Medicine

## 2016-05-06 VITALS — BP 131/68 | HR 71 | Temp 98.2°F | Ht 68.0 in | Wt 203.1 lb

## 2016-05-06 DIAGNOSIS — F200 Paranoid schizophrenia: Secondary | ICD-10-CM | POA: Diagnosis not present

## 2016-05-06 DIAGNOSIS — K0889 Other specified disorders of teeth and supporting structures: Secondary | ICD-10-CM

## 2016-05-06 DIAGNOSIS — E876 Hypokalemia: Secondary | ICD-10-CM

## 2016-05-06 DIAGNOSIS — E785 Hyperlipidemia, unspecified: Secondary | ICD-10-CM

## 2016-05-06 DIAGNOSIS — K029 Dental caries, unspecified: Secondary | ICD-10-CM

## 2016-05-06 DIAGNOSIS — J309 Allergic rhinitis, unspecified: Secondary | ICD-10-CM

## 2016-05-06 DIAGNOSIS — K5900 Constipation, unspecified: Secondary | ICD-10-CM

## 2016-05-06 DIAGNOSIS — I10 Essential (primary) hypertension: Secondary | ICD-10-CM | POA: Diagnosis not present

## 2016-05-06 DIAGNOSIS — Z1211 Encounter for screening for malignant neoplasm of colon: Secondary | ICD-10-CM

## 2016-05-06 MED ORDER — ATORVASTATIN CALCIUM 20 MG PO TABS: 20.0000 mg | ORAL_TABLET | Freq: Every day | ORAL | Status: DC

## 2016-05-06 NOTE — Patient Instructions (Addendum)
1. I will call if there are problems with your labs.     2. Please take all medications as prescribed.    3. If you have worsening of your symptoms or new symptoms arise, please call the clinic [phone]), or go to the ER immediately if symptoms are severe.  Be sure to see the dentist.  Please return to clinic in 3 months for follow-up.

## 2016-05-06 NOTE — Progress Notes (Signed)
Subjective:    Patient ID: Leonard Tucker, male    DOB: [birth year], 60 y.o.   MRN: [id number]  HPI Comments: Leonard Tucker is a 60 year old male with PMH as below here for follow-up of HTN.  Please see problem based charting for the status of this and other chronic conditions.    Past Medical History  Diagnosis Date  . Deaf-mutism   . MR (mental retardation)   . Schizophrenia, paranoid, chronic ([place])   . Chronic constipation   . Hypertriglyceridemia   . Dental caries   . Intertrigo   . Umbilical hernia   . History of iron deficiency anemia    Current Outpatient Prescriptions on File Prior to Visit  Medication Sig Dispense Refill  . atenolol (TENORMIN) 25 MG tablet TAKE 1 TABLET BY MOUTH EVERY DAY 90 tablet 1  . atorvastatin (LIPITOR) 10 MG tablet TAKE 1 TABLET (10 MG TOTAL) BY MOUTH DAILY. 30 tablet 5  . cetirizine (ZYRTEC) 10 MG tablet Take 1 tablet (10 mg total) by mouth daily. 90 tablet 1  . docusate sodium (COLACE) 100 MG capsule Take 1 capsule (100 mg total) by mouth 2 (two) times daily as needed for mild constipation. 60 capsule 1  . fluPHENAZine decanoate (PROLIXIN) 25 MG/ML injection Inject 32.5 mg into the muscle every 21 ( twenty-one) days.     . fluticasone (FLONASE) 50 MCG/ACT nasal spray Place 2 sprays into both nostrils daily. 16 g 2  . hydrocortisone (ANUSOL-HC) 2.5 % rectal cream Apply rectally 2 times daily as needed.  Do not use for more than 1 week at a time. 30 g 0  . iron polysaccharides (FERREX 150) 150 MG capsule Take 1 capsule (150 mg total) by mouth daily. 90 capsule 1  . polyethylene glycol powder (MIRALAX) powder Take 17 g by mouth daily as needed for mild constipation. 250 g 1  . potassium chloride SA (KLOR-CON M20) 20 MEQ tablet TAKE 2 TABLETS (40 MEQ TOTAL) BY MOUTH DAILY. 60 tablet 0  . triamterene-hydrochlorothiazide (DYAZIDE) 37.5-25 MG capsule Take 1 each (1 capsule total) by mouth daily. 90 capsule 1  . Witch Hazel (TUCKS) 50 % PADS Apply 1 application  topically 3 (three) times daily as needed. Apply to anorectal area as needed 3 times daily or after bowel movements. (Patient not taking: Reported on 10/11/2015) 1 each 2  . [DISCONTINUED] Potassium Chloride ER 20 MEQ TBCR Take 20 mEq by mouth 2 (two) times daily. 60 tablet 5   No current facility-administered medications on file prior to visit.    Review of Systems  Constitutional: Negative for fever, appetite change and unexpected weight change.  Respiratory: Negative for shortness of breath and wheezing.   Cardiovascular: Negative for chest pain and leg swelling.  Gastrointestinal: Positive for constipation. Negative for nausea, vomiting, abdominal pain, diarrhea and blood in stool.       Constipation responds to prn colace, miralax  Genitourinary: Negative for dysuria.  Neurological: Negative for tremors and syncope.  Psychiatric/Behavioral:       No behavior change       Filed Vitals:   05/06/16 1035  BP: 131/68  Pulse: 71  Temp: 98.2 F (36.8 C)  TempSrc: Oral  Height: 5\' 8"  (1.727 m)  Weight: 203 lb 1.6 oz (92.126 kg)  SpO2: 100%     Objective:   Physical Exam  Constitutional: He is oriented to person, place, and time. He appears well-developed. No distress.  HENT:  Head: Normocephalic and atraumatic.  Mouth/Throat: Oropharynx is clear and moist. No oropharyngeal exudate.  Eyes: Conjunctivae and EOM are normal. Pupils are equal, round, and reactive to light. No scleral icterus.  Neck: Neck supple.  Cardiovascular: Normal rate, regular rhythm and normal heart sounds.  Exam reveals no gallop and no friction rub.   No murmur heard. Pulmonary/Chest: Effort normal and breath sounds normal. No respiratory distress. He has no wheezes. He has no rales.  Abdominal: Soft. Bowel sounds are normal. He exhibits no distension. There is no tenderness. There is no rebound and no guarding.  Musculoskeletal: He exhibits no edema or tenderness.  Lymphadenopathy:    He has no cervical  adenopathy.  Neurological: He is alert and oriented to person, place, and time. No cranial nerve deficit.  Skin: Skin is warm. He is not diaphoretic.  Psychiatric: He has a normal mood and affect. His behavior is normal. Judgment and thought content normal.  Vitals reviewed.         Assessment & Plan:  Please see problem based charting for A&P.

## 2016-05-07 LAB — BMP8+ANION GAP
Anion Gap: 20 mmol/L — ABNORMAL HIGH (ref 10.0–18.0)
BUN/Creatinine Ratio: 11 (ref 9–20)
BUN: 10 mg/dL (ref 6–24)
CALCIUM: 9.5 mg/dL (ref 8.7–10.2)
CHLORIDE: 103 mmol/L (ref 96–106)
CO2: 25 mmol/L (ref 18–29)
Creatinine, Ser: 0.94 mg/dL (ref 0.76–1.27)
GFR, EST AFRICAN AMERICAN: 103 mL/min/{1.73_m2} (ref >59)
GFR, EST NON AFRICAN AMERICAN: 89 mL/min/{1.73_m2} (ref >59)
GLUCOSE: 78 mg/dL (ref 65–99)
Potassium: 3.6 mmol/L (ref 3.5–5.2)
Sodium: 148 mmol/L — ABNORMAL HIGH (ref 134–144)

## 2016-05-07 MED ORDER — POTASSIUM CHLORIDE CRYS ER 20 MEQ PO TBCR: EXTENDED_RELEASE_TABLET | ORAL | Status: DC

## 2016-05-07 MED ORDER — CETIRIZINE HCL 10 MG PO TABS: 10.0000 mg | ORAL_TABLET | Freq: Every day | ORAL | Status: DC

## 2016-05-07 MED ORDER — FLUTICASONE PROPIONATE 50 MCG/ACT NA SUSP: 2.0000 | Freq: Every day | NASAL | Status: DC

## 2016-05-07 MED ORDER — TRIAMTERENE-HCTZ 37.5-25 MG PO CAPS: 1.0000 | ORAL_CAPSULE | Freq: Every day | ORAL | Status: DC

## 2016-05-07 MED ORDER — POLYSACCHARIDE IRON COMPLEX 150 MG PO CAPS: 150.0000 mg | ORAL_CAPSULE | Freq: Every day | ORAL | Status: DC

## 2016-05-07 MED ORDER — ATENOLOL 25 MG PO TABS: 25.0000 mg | ORAL_TABLET | Freq: Every day | ORAL | Status: DC

## 2016-05-07 NOTE — Assessment & Plan Note (Addendum)
Assessment:  Stable.  Compliant with Prolixin q21 days Plan:  Continue Prolixin and mental health follow-up.  CMP today.  Can consider A1c in the future but glucose has been ok.

## 2016-05-07 NOTE — Assessment & Plan Note (Signed)
Assessment:  Tolerating lipitor 10mg  daily. Plan:  Increase to Lipitor 20mg  daily for increased chol reduction.

## 2016-05-07 NOTE — Assessment & Plan Note (Signed)
Assessment:  Mom says he had colonoscopy. Plan:  Check with GI, send for colonoscopy records

## 2016-05-07 NOTE — Assessment & Plan Note (Signed)
Assessment:  Mom is only giving him 50mEq K per day instead of 30mEq because she notices diarrhea with the higher dose. Plan:  Check K today.  Ok to continue 31mEq daily for now.  Also continue triamterene.

## 2016-05-07 NOTE — Assessment & Plan Note (Signed)
Assessment:  Constipation doing better.  Uses Colace and Miralax prn.  No recent rectal bleeding.  His mom reports that he went for colonoscopy last year when she had her scope but I never got record of this. Plan:  Check with GI, request colonoscopy records if it was done.  Otherwise, refer for colonoscopy.

## 2016-05-07 NOTE — Assessment & Plan Note (Signed)
BP Readings from Last 3 Encounters:  05/06/16 131/68  12/29/15 111/76  10/11/15 114/64    Lab Results  Component Value Date   NA 148* 05/06/2016   K 3.6 05/06/2016   CREATININE 0.94 05/06/2016    Assessment: Blood pressure control:  well controlled Progress toward BP goal:   at goal Comments: Compliant with medications - atenolol 25mg  daily and triamteren-HCTZ 37.5-25mg  daily  Plan: Medications:  continue current medications:  atenolol 25mg  daily and triamteren-HCTZ 37.5-25mg  daily Educational resources provided: brochure (denies) Self management tools provided:   Other plans: CMP, follow-up in 3 months

## 2016-05-07 NOTE — Assessment & Plan Note (Signed)
Assessment:  Poor dentition.  No sign of acute infection or abscess.  No cervical LAN.   Plan:  Mother requests number for dentist, Dr. Gentry Roch, who Mendel has seen before.  The phone number was provided to her so she can arrange dental visit for [facility].

## 2016-05-09 NOTE — Progress Notes (Signed)
Internal Medicine Clinic Attending  Case discussed with Dr. Wilson soon after the resident saw the patient.  We reviewed the resident's history and exam and pertinent patient test results.  I agree with the assessment, diagnosis, and plan of care documented in the resident's note.  

## 2016-05-14 ENCOUNTER — Telehealth: Payer: Self-pay | Admitting: *Deleted

## 2016-05-14 NOTE — Telephone Encounter (Signed)
Call to patient's mother informed that patient's Sodium level is elevated.  Dr. Redmond Pulling would like for patient to increase water intake. Patient was asked if son ate a lot of chips.  Mom said yes everyday. Mom was asked to have patient cut back on salty snacks as well and to replace  Sodas with water.  Patient's Mom expressed understanding of the plan. Will bring patient in a few days to the Clinics for Lab Mauricio Po, RN 05/04/2016 5:58 PM

## 2016-05-15 ENCOUNTER — Encounter: Payer: Self-pay | Admitting: *Deleted

## 2016-05-21 ENCOUNTER — Other Ambulatory Visit: Payer: Self-pay | Admitting: Internal Medicine

## 2016-05-21 DIAGNOSIS — E870 Hyperosmolality and hypernatremia: Secondary | ICD-10-CM

## 2016-05-22 ENCOUNTER — Other Ambulatory Visit (INDEPENDENT_AMBULATORY_CARE_PROVIDER_SITE_OTHER): Payer: Medicare Other

## 2016-05-22 DIAGNOSIS — E870 Hyperosmolality and hypernatremia: Secondary | ICD-10-CM | POA: Diagnosis not present

## 2016-05-23 LAB — BMP8+ANION GAP
Anion Gap: 19 mmol/L — ABNORMAL HIGH (ref 10.0–18.0)
BUN/Creatinine Ratio: 13 (ref 9–20)
BUN: 12 mg/dL (ref 6–24)
CO2: 22 mmol/L (ref 18–29)
Calcium: 9.4 mg/dL (ref 8.7–10.2)
Chloride: 99 mmol/L (ref 96–106)
Creatinine, Ser: 0.95 mg/dL (ref 0.76–1.27)
GFR calc non Af Amer: 88 mL/min/{1.73_m2} (ref >59)
GFR, EST AFRICAN AMERICAN: 102 mL/min/{1.73_m2} (ref >59)
Glucose: 105 mg/dL — ABNORMAL HIGH (ref 65–99)
POTASSIUM: 3.3 mmol/L — AB (ref 3.5–5.2)
Sodium: 140 mmol/L (ref 134–144)

## 2016-05-31 ENCOUNTER — Telehealth: Payer: Self-pay | Admitting: *Deleted

## 2016-05-31 NOTE — Telephone Encounter (Signed)
Call to patient's mother x 2 to ask if patient is taking his Potassium pills.  Unable to reach or leave a message x 2.  Will attempt again.  Sander Nephew, RN 05/31/2016 2:41 PM.

## 2016-06-12 LAB — HEPATIC FUNCTION PANEL
ALBUMIN: 4.6 g/dL (ref 3.5–5.5)
ALT: 23 IU/L (ref 0–44)
AST: 13 IU/L (ref 0–40)
Alkaline Phosphatase: 91 IU/L (ref 39–117)
BILIRUBIN TOTAL: 0.2 mg/dL (ref 0.0–1.2)
BILIRUBIN, DIRECT: 0.08 mg/dL (ref 0.00–0.40)
Total Protein: 8.1 g/dL (ref 6.0–8.5)

## 2016-07-03 DIAGNOSIS — F209 Schizophrenia, unspecified: Secondary | ICD-10-CM | POA: Diagnosis not present

## 2016-07-10 ENCOUNTER — Telehealth: Payer: Self-pay | Admitting: Internal Medicine

## 2016-07-10 DIAGNOSIS — J309 Allergic rhinitis, unspecified: Secondary | ICD-10-CM

## 2016-07-10 NOTE — Telephone Encounter (Signed)
I am fine with loratidine or cetirizine.

## 2016-07-10 NOTE — Telephone Encounter (Signed)
Mother calling because she was unable to pick up Zyrtec at the pharmacy because the insurance would not pay. Contacted pharmacy to clarify they said that the insurance will not pay because it is now listed under OTC medications. Is there something we could switch him to that is not considered OTC? Thanks!

## 2016-07-10 NOTE — Telephone Encounter (Signed)
It looks like cetirizine or loratadine should be covered, otherwise trying montelukast. Dr. Gay Filler, any preference?

## 2016-07-10 NOTE — Telephone Encounter (Signed)
Calling states that the insurance doesn't cover insurance medication.

## 2016-07-12 MED ORDER — LORATADINE 10 MG PO TABS: 10.0000 mg | ORAL_TABLET | Freq: Every day | ORAL | 3 refills | Status: DC

## 2016-09-11 ENCOUNTER — Telehealth: Payer: Self-pay | Admitting: Internal Medicine

## 2016-09-11 NOTE — Telephone Encounter (Signed)
APT. REMINDER CALL, LMTCB °

## 2016-09-12 ENCOUNTER — Ambulatory Visit (INDEPENDENT_AMBULATORY_CARE_PROVIDER_SITE_OTHER): Payer: Medicare Other | Admitting: Internal Medicine

## 2016-09-12 VITALS — BP 120/80 | HR 79 | Temp 97.6°F | Ht 68.0 in | Wt 205.1 lb

## 2016-09-12 DIAGNOSIS — K5900 Constipation, unspecified: Secondary | ICD-10-CM | POA: Diagnosis not present

## 2016-09-12 DIAGNOSIS — E785 Hyperlipidemia, unspecified: Secondary | ICD-10-CM | POA: Diagnosis not present

## 2016-09-12 DIAGNOSIS — K219 Gastro-esophageal reflux disease without esophagitis: Secondary | ICD-10-CM | POA: Diagnosis not present

## 2016-09-12 DIAGNOSIS — I10 Essential (primary) hypertension: Secondary | ICD-10-CM

## 2016-09-12 DIAGNOSIS — E876 Hypokalemia: Secondary | ICD-10-CM

## 2016-09-12 DIAGNOSIS — Z79899 Other long term (current) drug therapy: Secondary | ICD-10-CM

## 2016-09-12 DIAGNOSIS — D500 Iron deficiency anemia secondary to blood loss (chronic): Secondary | ICD-10-CM | POA: Diagnosis not present

## 2016-09-12 DIAGNOSIS — Z23 Encounter for immunization: Secondary | ICD-10-CM

## 2016-09-12 MED ORDER — PRAVASTATIN SODIUM 40 MG PO TABS: 40.0000 mg | ORAL_TABLET | Freq: Every day | ORAL | 5 refills | Status: DC

## 2016-09-12 MED ORDER — OMEPRAZOLE 20 MG PO CPDR: 20.0000 mg | DELAYED_RELEASE_CAPSULE | Freq: Every day | ORAL | 1 refills | Status: DC

## 2016-09-12 NOTE — Patient Instructions (Signed)
Thanks for your visit today. We will continue your current medications. We will check your potassium level today to make sure your current supplements are working well.  We will also change your atorvastatin medication to a new medicine called Pravastatin which will hopefully not cause your belly to feel upset.

## 2016-09-12 NOTE — Assessment & Plan Note (Signed)
Patient currently on triamcinolone/HCTZ with a history of hypokalemia. Last BMP demonstrated a potassium of 3.3. He has remained asymptomatic over the intervening months. He has been compliant with his potassium supplementation therapy but has been taking only 1 potassium chloride pill 20 mEq daily instead of the prescribed 2. We will recheck potassium today and increase supplementation as necessary. Blood pressures were in good control and would not recommend changing therapy if possible.

## 2016-09-12 NOTE — Assessment & Plan Note (Signed)
Patient has history of iron deficient anemia. He is noted to have what appears to be hemorrhoidal bleeding occasionally with firm bowel movements. His hemoglobin is within normal limits at his last check and he is not having any significant signs of bleeding clinically. His attempted and several colonoscopies in the past or not been successful due to inability to complete the prep. She did have a successful colonoscopy several years ago around the time when she received her colonoscopy in 2015. Of note were unable to locate these records but will attempt to find them in order to demonstrate that there is no signs of colon cancer which would account for his iron deficiency anemia and chronic blood loss. Otherwise given that his hemoglobin is within normal limits and would not recommend iron supplementation and we'll trial discontinuation of iron. We'll plan to recheck CBC at his next visit.

## 2016-09-12 NOTE — Assessment & Plan Note (Signed)
Patient has history of GERD with symptoms of reflux and regurgitation of food into the esophagus. Patient's mother notes that he has not been complaining of these symptoms recently. He has been compliant with his omeprazole 20 mg daily and this appears to have been effective.

## 2016-09-12 NOTE — Progress Notes (Signed)
CC: HTN, HLD, anemia, constipation HPI: [name] is a 60 y.o. male with a h/o of HTN, HLD, anemia, constipation who presents for evaluation and management of the above chronic diseases.  Please see Problem-based charting for HPI and the status of patient's chronic medical conditions.  Past Medical History:  Diagnosis Date  . Chronic constipation   . Deaf-mutism   . Dental caries   . History of iron deficiency anemia   . Hypertriglyceridemia   . Intertrigo   . MR (mental retardation)   . Schizophrenia, paranoid, chronic ([place])   . Umbilical hernia    Social Hx: H/o MR, lives with mother. No tobacco/alcohol use.  Review of Systems: ROS in HPI. Otherwise: Review of Systems  Constitutional: Negative for chills, fever and weight loss.  Respiratory: Negative for cough and shortness of breath.   Cardiovascular: Negative for chest pain and leg swelling.  Gastrointestinal: Negative for abdominal pain, constipation, diarrhea, nausea and vomiting.  Genitourinary: Negative for dysuria, frequency and urgency.    Physical Exam: Vitals:   09/12/16 1343  BP: 120/80  Pulse: 79  Temp: 97.6 F (36.4 C)  TempSrc: Oral  SpO2: 97%  Weight: 205 lb 1.6 oz (93 kg)  Height: 5\' 8"  (1.727 m)   Physical Exam  Constitutional: He is cooperative. No distress.  Cardiovascular: Normal rate, regular rhythm, normal heart sounds and normal pulses.  Exam reveals no gallop.   No murmur heard. Pulmonary/Chest: Effort normal and breath sounds normal. No respiratory distress. He has no wheezes. He has no rhonchi. He has no rales.  Abdominal: Soft. Bowel sounds are normal. There is no tenderness.  Musculoskeletal: He exhibits no edema.  Skin: Skin is intact.    Assessment & Plan:  See encounters tab for problem based medical decision making. Patient seen with Dr. Angelia Mould  HYPOKALEMIA Patient currently on triamcinolone/HCTZ with a history of hypokalemia. Last BMP demonstrated a potassium  of 3.3. He has remained asymptomatic over the intervening months. He has been compliant with his potassium supplementation therapy but has been taking only 1 potassium chloride pill 20 mEq daily instead of the prescribed 2. We will recheck potassium today and increase supplementation as necessary. Blood pressures were in good control and would not recommend changing therapy if possible.  GERD (gastroesophageal reflux disease) Patient has history of GERD with symptoms of reflux and regurgitation of food into the esophagus. Patient's mother notes that he has not been complaining of these symptoms recently. He has been compliant with his omeprazole 20 mg daily and this appears to have been effective.  Essential hypertension Patient's blood pressure is very well controlled on his triamterene/HCTZ. Blood pressure today was 120/80. Continue triamterene HCTZ 37.5-25 milligrams daily  Dyslipidemia Patient has a history of hyperlipidemia with elevated triglycerides currently on atorvastatin 20 mg daily. Patient's mother notes that this medication has been giving him significant GI upset. I recommend decreasing his statin to lower intensity with pravastatin 40 mg. Patient is moderate risk and feel that this moderate intensity statin would be appropriate in order to reduce side effects.  Constipation Patient has a history of constipation which is likely related to his iron supplementation for his chronic anemia. Patient has been compliant with his iron supplementation and reports that his bowel movements have been regular with the setting of daily docusate. We'll continue current therapy and recommend MiraLAX if needed for additional  Iron deficiency anemia due to chronic blood loss Patient has history of iron deficient anemia.  He is noted to have what appears to be hemorrhoidal bleeding occasionally with firm bowel movements. His hemoglobin is within normal limits at his last check and he is not having any  significant signs of bleeding clinically. His attempted and several colonoscopies in the past or not been successful due to inability to complete the prep. She did have a successful colonoscopy several years ago around the time when she received her colonoscopy in 2015. Of note were unable to locate these records but will attempt to find them in order to demonstrate that there is no signs of colon cancer which would account for his iron deficiency anemia and chronic blood loss. Otherwise given that his hemoglobin is within normal limits and would not recommend iron supplementation and we'll trial discontinuation of iron. We'll plan to recheck CBC at his next visit.   Signed: Holley Raring, MD 09/12/2016, 5:04 PM  Pager: [phone]

## 2016-09-12 NOTE — Assessment & Plan Note (Signed)
Patient has a history of constipation which is likely related to his iron supplementation for his chronic anemia. Patient has been compliant with his iron supplementation and reports that his bowel movements have been regular with the setting of daily docusate. We'll continue current therapy and recommend MiraLAX if needed for additional

## 2016-09-12 NOTE — Assessment & Plan Note (Signed)
Patient's blood pressure is very well controlled on his triamterene/HCTZ. Blood pressure today was 120/80. Continue triamterene HCTZ 37.5-25 milligrams daily

## 2016-09-12 NOTE — Assessment & Plan Note (Signed)
Patient has a history of hyperlipidemia with elevated triglycerides currently on atorvastatin 20 mg daily. Patient's mother notes that this medication has been giving him significant GI upset. I recommend decreasing his statin to lower intensity with pravastatin 40 mg. Patient is moderate risk and feel that this moderate intensity statin would be appropriate in order to reduce side effects.

## 2016-09-13 LAB — BMP8+ANION GAP
ANION GAP: 20 mmol/L — AB (ref 10.0–18.0)
BUN / CREAT RATIO: 11 (ref 9–20)
BUN: 12 mg/dL (ref 6–24)
CO2: 23 mmol/L (ref 18–29)
CREATININE: 1.12 mg/dL (ref 0.76–1.27)
Calcium: 10.1 mg/dL (ref 8.7–10.2)
Chloride: 99 mmol/L (ref 96–106)
GFR, EST AFRICAN AMERICAN: 83 mL/min/{1.73_m2} (ref >59)
GFR, EST NON AFRICAN AMERICAN: 72 mL/min/{1.73_m2} (ref >59)
Glucose: 103 mg/dL — ABNORMAL HIGH (ref 65–99)
Potassium: 3.6 mmol/L (ref 3.5–5.2)
SODIUM: 142 mmol/L (ref 134–144)

## 2016-09-17 NOTE — Progress Notes (Signed)
Internal Medicine Clinic Attending  I saw and evaluated the patient.  I personally confirmed the key portions of the history and exam documented by Dr. Strelow and I reviewed pertinent patient test results.  The assessment, diagnosis, and plan were formulated together and I agree with the documentation in the resident's note. 

## 2016-12-14 ENCOUNTER — Other Ambulatory Visit: Payer: Self-pay | Admitting: Internal Medicine

## 2016-12-14 DIAGNOSIS — J309 Allergic rhinitis, unspecified: Secondary | ICD-10-CM

## 2016-12-25 NOTE — Progress Notes (Signed)
CC: routine f/u HPI: Mr. ZAKARY GRAYS is a 61 y.o. male with a h/o of HTN, HLD, anemia, constipation  who presents for evaluation and management of the above chronic conditions.  Please see Problem-based charting for HPI and the status of patient's chronic medical conditions.  Past Medical History:  Diagnosis Date  . Chronic constipation   . Deaf-mutism   . Dental caries   . History of iron deficiency anemia   . Hypertriglyceridemia   . Intertrigo   . MR (mental retardation)   . Schizophrenia, paranoid, chronic ([place])   . Umbilical hernia    Social History  Substance Use Topics  . Smoking status: Never Smoker  . Smokeless tobacco: Never Used  . Alcohol use No   Family History  Problem Relation Age of Onset  . Diabetes Maternal Aunt   . Hypertension Mother    Review of Systems: ROS in HPI. Otherwise: Review of Systems  Constitutional: Negative for chills, fever and weight loss.  Respiratory: Negative for cough and shortness of breath.   Cardiovascular: Negative for chest pain and leg swelling.  Gastrointestinal: Negative for abdominal pain, constipation, diarrhea, nausea and vomiting.  Genitourinary: Negative for dysuria, frequency and urgency.   Physical Exam: Vitals:   12/26/16 1315  BP: 121/75  Pulse: 74  Temp: 97.8 F (36.6 C)  TempSrc: Oral  SpO2: 99%  Weight: 201 lb 12.8 oz (91.5 kg)   Physical Exam  Constitutional: He is cooperative. No distress.  Cardiovascular: Normal rate, regular rhythm, normal heart sounds and normal pulses.  Exam reveals no gallop.   No murmur heard. Pulmonary/Chest: Effort normal and breath sounds normal. No respiratory distress. He has no wheezes. He has no rhonchi. He has no rales.  Abdominal: Soft. Bowel sounds are normal. There is no tenderness.  Musculoskeletal: He exhibits no edema.  Skin: Skin is intact.    Assessment & Plan:  See encounters tab for problem based medical decision making. Patient discussed with Dr.  Dareen Piano  Iron deficiency anemia due to chronic blood loss Patient has continued to take his iron supplementation in spite of our plan to discontinue at his last visit. Patient denies any symptoms of GI bleeding such as dark tarry stools or frank red blood. He continues to have some constipation but has had regular bowel movements on his current bowel regimen.  Assessment: Resolving iron deficient anemia  Plan: We will recheck a CBC today and discontinue iron supplements.  GERD (gastroesophageal reflux disease) Patient continues on his chronic omeprazole therapy with good success. Occasionally he misses his therapy he notes symptoms consistent with GERD. He denies any adverse effects from this such as changed appetite.  Assessment: Stable GERD  Plan: Continue PPI therapy.  Essential hypertension Patient is stable well controlled hypertension. He denies any chest pain, shortness of breath, headaches. He is compliant with his current therapy.  Assessment: Stable hypertension  Plan: Continue atenolol 25 mg daily and triamterene-hydrochlorothiazide 30 7. 5-25 milligrams.  Dyslipidemia Patient had lipid panel which showed mild hyperlipidemia. He is currently on statin therapy with good success. He denies any adverse reactions such as myalgias.  Assessment: Stable Hyperlipidemia  Plan: Continue statin therapy  Constipation Patient continues to have some symptoms of constipation which are well treated with his current bowel regimen of daily Colace twice daily if necessary. He has MiraLAX as needed.  Patient denies any signs or symptoms of blood in the stool and has not had signs of hemorrhoidal bleeding in some time.  Assessment: Stable constipation  Plan: Continue current bowel regimen.   Signed: Holley Raring, MD 12/26/2016, 2:22 PM  Pager: [phone]

## 2016-12-26 ENCOUNTER — Encounter: Payer: Self-pay | Admitting: Internal Medicine

## 2016-12-26 ENCOUNTER — Ambulatory Visit (INDEPENDENT_AMBULATORY_CARE_PROVIDER_SITE_OTHER): Payer: Medicare Other | Admitting: Internal Medicine

## 2016-12-26 VITALS — BP 121/75 | HR 74 | Temp 97.8°F | Wt 201.8 lb

## 2016-12-26 DIAGNOSIS — Z8249 Family history of ischemic heart disease and other diseases of the circulatory system: Secondary | ICD-10-CM

## 2016-12-26 DIAGNOSIS — I10 Essential (primary) hypertension: Secondary | ICD-10-CM

## 2016-12-26 DIAGNOSIS — Z79899 Other long term (current) drug therapy: Secondary | ICD-10-CM | POA: Diagnosis not present

## 2016-12-26 DIAGNOSIS — E785 Hyperlipidemia, unspecified: Secondary | ICD-10-CM

## 2016-12-26 DIAGNOSIS — K5900 Constipation, unspecified: Secondary | ICD-10-CM

## 2016-12-26 DIAGNOSIS — D500 Iron deficiency anemia secondary to blood loss (chronic): Secondary | ICD-10-CM

## 2016-12-26 DIAGNOSIS — K219 Gastro-esophageal reflux disease without esophagitis: Secondary | ICD-10-CM | POA: Diagnosis not present

## 2016-12-26 DIAGNOSIS — Z833 Family history of diabetes mellitus: Secondary | ICD-10-CM

## 2016-12-26 NOTE — Patient Instructions (Signed)
You are doing great. We will recheck your blood counts to ensure that your iron supplements are working.  Please follow up in 6 months.

## 2016-12-26 NOTE — Assessment & Plan Note (Signed)
Patient had lipid panel which showed mild hyperlipidemia. He is currently on statin therapy with good success. He denies any adverse reactions such as myalgias.  Assessment: Stable Hyperlipidemia  Plan: Continue statin therapy

## 2016-12-26 NOTE — Assessment & Plan Note (Signed)
Patient has continued to take his iron supplementation in spite of our plan to discontinue at his last visit. Patient denies any symptoms of GI bleeding such as dark tarry stools or frank red blood. He continues to have some constipation but has had regular bowel movements on his current bowel regimen.  Assessment: Resolving iron deficient anemia  Plan: We will recheck a CBC today and discontinue iron supplements.

## 2016-12-26 NOTE — Assessment & Plan Note (Signed)
Patient is stable well controlled hypertension. He denies any chest pain, shortness of breath, headaches. He is compliant with his current therapy.  Assessment: Stable hypertension  Plan: Continue atenolol 25 mg daily and triamterene-hydrochlorothiazide 30 7. 5-25 milligrams.

## 2016-12-26 NOTE — Assessment & Plan Note (Signed)
Patient continues to have some symptoms of constipation which are well treated with his current bowel regimen of daily Colace twice daily if necessary. He has MiraLAX as needed.  Patient denies any signs or symptoms of blood in the stool and has not had signs of hemorrhoidal bleeding in some time.  Assessment: Stable constipation  Plan: Continue current bowel regimen.

## 2016-12-26 NOTE — Assessment & Plan Note (Signed)
Patient continues on his chronic omeprazole therapy with good success. Occasionally he misses his therapy he notes symptoms consistent with GERD. He denies any adverse effects from this such as changed appetite.  Assessment: Stable GERD  Plan: Continue PPI therapy.

## 2016-12-27 ENCOUNTER — Encounter: Payer: Self-pay | Admitting: Internal Medicine

## 2016-12-27 ENCOUNTER — Other Ambulatory Visit: Payer: Self-pay | Admitting: Internal Medicine

## 2016-12-27 DIAGNOSIS — D500 Iron deficiency anemia secondary to blood loss (chronic): Secondary | ICD-10-CM

## 2016-12-27 LAB — CBC
HEMOGLOBIN: 12.9 g/dL — AB (ref 13.0–17.7)
Hematocrit: 40 % (ref 37.5–51.0)
MCH: 23.8 pg — ABNORMAL LOW (ref 26.6–33.0)
MCHC: 32.3 g/dL (ref 31.5–35.7)
MCV: 74 fL — ABNORMAL LOW (ref 79–97)
Platelets: 228 10*3/uL (ref 150–379)
RBC: 5.42 x10E6/uL (ref 4.14–5.80)
RDW: 15.5 % — ABNORMAL HIGH (ref 12.3–15.4)
WBC: 6.1 10*3/uL (ref 3.4–10.8)

## 2016-12-27 NOTE — Progress Notes (Signed)
Internal Medicine Clinic Attending  Case discussed with Dr. Gay Filler at the time of the visit.  We reviewed the resident's history and exam and pertinent patient test results.  I agree with the assessment, diagnosis, and plan of care documented in the resident's note.  Patient noted to have mild microcytic anemia on blood work and will need a referral for a colonoscopy again.

## 2017-01-09 ENCOUNTER — Other Ambulatory Visit: Payer: Self-pay | Admitting: Internal Medicine

## 2017-01-09 DIAGNOSIS — I10 Essential (primary) hypertension: Secondary | ICD-10-CM

## 2017-01-30 DIAGNOSIS — F209 Schizophrenia, unspecified: Secondary | ICD-10-CM | POA: Diagnosis not present

## 2017-03-10 DIAGNOSIS — D509 Iron deficiency anemia, unspecified: Secondary | ICD-10-CM | POA: Diagnosis not present

## 2017-03-10 DIAGNOSIS — Z86010 Personal history of colonic polyps: Secondary | ICD-10-CM | POA: Diagnosis not present

## 2017-04-24 ENCOUNTER — Other Ambulatory Visit: Payer: Self-pay | Admitting: Internal Medicine

## 2017-04-24 DIAGNOSIS — J309 Allergic rhinitis, unspecified: Secondary | ICD-10-CM

## 2017-04-24 DIAGNOSIS — E876 Hypokalemia: Secondary | ICD-10-CM

## 2017-05-06 ENCOUNTER — Other Ambulatory Visit: Payer: Self-pay | Admitting: Internal Medicine

## 2017-05-06 DIAGNOSIS — I10 Essential (primary) hypertension: Secondary | ICD-10-CM

## 2017-05-09 ENCOUNTER — Encounter: Payer: Self-pay | Admitting: *Deleted

## 2017-05-16 ENCOUNTER — Encounter (HOSPITAL_COMMUNITY): Payer: Self-pay

## 2017-05-16 ENCOUNTER — Emergency Department (HOSPITAL_COMMUNITY)
Admission: EM | Admit: 2017-05-16 | Discharge: 2017-05-16 | Disposition: A | Discharge status: Home / Self Care | Payer: Medicare Other | Attending: Emergency Medicine | Admitting: Emergency Medicine

## 2017-05-16 DIAGNOSIS — R112 Nausea with vomiting, unspecified: Secondary | ICD-10-CM | POA: Diagnosis present

## 2017-05-16 DIAGNOSIS — F79 Unspecified intellectual disabilities: Secondary | ICD-10-CM | POA: Diagnosis not present

## 2017-05-16 DIAGNOSIS — I10 Essential (primary) hypertension: Secondary | ICD-10-CM | POA: Insufficient documentation

## 2017-05-16 DIAGNOSIS — E785 Hyperlipidemia, unspecified: Secondary | ICD-10-CM | POA: Diagnosis not present

## 2017-05-16 DIAGNOSIS — F200 Paranoid schizophrenia: Secondary | ICD-10-CM | POA: Diagnosis not present

## 2017-05-16 DIAGNOSIS — R197 Diarrhea, unspecified: Secondary | ICD-10-CM | POA: Insufficient documentation

## 2017-05-16 DIAGNOSIS — H913 Deaf nonspeaking, not elsewhere classified: Secondary | ICD-10-CM | POA: Insufficient documentation

## 2017-05-16 DIAGNOSIS — Z79899 Other long term (current) drug therapy: Secondary | ICD-10-CM | POA: Diagnosis not present

## 2017-05-16 LAB — URINALYSIS, ROUTINE W REFLEX MICROSCOPIC
BACTERIA UA: NONE SEEN
BILIRUBIN URINE: NEGATIVE
Glucose, UA: NEGATIVE mg/dL
KETONES UR: 5 mg/dL — AB
LEUKOCYTES UA: NEGATIVE
Nitrite: NEGATIVE
Protein, ur: 30 mg/dL — AB
SQUAMOUS EPITHELIAL / LPF: NONE SEEN
Specific Gravity, Urine: 1.02 (ref 1.005–1.030)
pH: 5 (ref 5.0–8.0)

## 2017-05-16 LAB — CBC
HEMATOCRIT: 45.4 % (ref 39.0–52.0)
Hemoglobin: 14.2 g/dL (ref 13.0–17.0)
MCH: 24.4 pg — AB (ref 26.0–34.0)
MCHC: 31.3 g/dL (ref 30.0–36.0)
MCV: 77.9 fL — AB (ref 78.0–100.0)
Platelets: 272 10*3/uL (ref 150–400)
RBC: 5.83 MIL/uL — AB (ref 4.22–5.81)
RDW: 15 % (ref 11.5–15.5)
WBC: 11.6 10*3/uL — AB (ref 4.0–10.5)

## 2017-05-16 LAB — COMPREHENSIVE METABOLIC PANEL
ALT: 39 U/L (ref 17–63)
AST: 34 U/L (ref 15–41)
Albumin: 4.7 g/dL (ref 3.5–5.0)
Alkaline Phosphatase: 78 U/L (ref 38–126)
Anion gap: 13 (ref 5–15)
BUN: 12 mg/dL (ref 6–20)
CHLORIDE: 104 mmol/L (ref 101–111)
CO2: 20 mmol/L — AB (ref 22–32)
CREATININE: 1.14 mg/dL (ref 0.61–1.24)
Calcium: 9.9 mg/dL (ref 8.9–10.3)
GFR calc Af Amer: >60 mL/min (ref >60)
Glucose, Bld: 148 mg/dL — ABNORMAL HIGH (ref 65–99)
POTASSIUM: 3.8 mmol/L (ref 3.5–5.1)
SODIUM: 137 mmol/L (ref 135–145)
Total Bilirubin: 0.6 mg/dL (ref 0.3–1.2)
Total Protein: 9.7 g/dL — ABNORMAL HIGH (ref 6.5–8.1)

## 2017-05-16 LAB — LIPASE, BLOOD: LIPASE: 27 U/L (ref 11–51)

## 2017-05-16 MED ORDER — ONDANSETRON 4 MG PO TBDP: 4.0000 mg | ORAL_TABLET | Freq: Three times a day (TID) | ORAL | 0 refills | Status: DC | PRN

## 2017-05-16 MED ORDER — SODIUM CHLORIDE 0.9 % IV BOLUS (SEPSIS)
1000.0000 mL | Freq: Once | INTRAVENOUS | Status: AC
  Administered 2017-05-16: 1000 mL via INTRAVENOUS

## 2017-05-16 MED ORDER — ONDANSETRON HCL 4 MG/2ML IJ SOLN
4.0000 mg | Freq: Once | INTRAMUSCULAR | Status: AC
  Administered 2017-05-16: 4 mg via INTRAVENOUS
  Filled 2017-05-16: qty 2

## 2017-05-16 NOTE — Discharge Instructions (Signed)
Your nausea, vomiting and diarrhea are most likely from food poisoning. Your blood work was normal today. You received fluids and nausea medicine and you felt much better.  Please take Zofran as needed for nausea.  Ensure you drink at least 2 L of water daily to prevent dehydration. Avoid any spicy, greasy or large meals. Avoid foods or dairy products to decrease diarrhea. Monitor your symptoms. Returns to the emergency department if your symptoms worsen or if they're associated with fever, abdominal pain.

## 2017-05-16 NOTE — ED Provider Notes (Signed)
[facility] DEPT Provider Note   Leonard Tucker: [id number] Arrival date & time: 05/16/17  1040     History   Chief Complaint Chief Complaint  Patient presents with  . Nausea  . Emesis    HPI DHILAN Tucker is a 61 y.o. male with history of mental retardation, death-mutism, consider the ED with her mother for sudden onset nausea, nonbloody nonbilious vomiting and nonbloody diarrhea that started overnight. Prior to symptom onset patient had file steak sandwiches from a restaurant, later that night he developed symptoms. He denies fevers, head trauma, chest pain, shortness of breath, abdominal pain, urinary symptoms. Patient reports that he was able to tolerate small amount of ginger ale prior to arrival. Nausea has improved.  No history of abdominal surgeries.  HPI  Past Medical History:  Diagnosis Date  . Chronic constipation   . Deaf-mutism   . Dental caries   . History of iron deficiency anemia   . Hypertriglyceridemia   . Intertrigo   . MR (mental retardation)   . Schizophrenia, paranoid, chronic ([place])   . Umbilical hernia     Patient Active Problem List   Diagnosis Date Noted  . Screening for colon cancer 02/10/2013  . Iron deficiency anemia due to chronic blood loss 11/01/2011  . Fungal infection of toenail 08/21/2011  . GERD (gastroesophageal reflux disease) 06/28/2011  . HYPOKALEMIA 03/27/2010  . Allergic rhinitis 10/18/2009  . Constipation 05/19/2007  . Dyslipidemia 11/18/2006  . PARANOID SCHIZOPHRENIA, CHRONIC 10/13/2006  . MENTAL RETARDATION 10/13/2006  . DEAF MUTISM 10/13/2006  . Essential hypertension 10/13/2006  . Dental caries 10/13/2006    History reviewed. No pertinent surgical history.     Home Medications    Prior to Admission medications   Medication Sig Start Date End Date Taking? Authorizing Provider  atenolol (TENORMIN) 25 MG tablet TAKE 1 TABLET (25 MG TOTAL) BY MOUTH DAILY. 01/10/17  Yes Holley Raring, MD  Dextromethorphan-Guaifenesin  (CHILDRENS COUGH) 5-100 MG/5ML LIQD Take 2.5 mLs by mouth every 6 (six) hours as needed (cough).   Yes [provider]  fluPHENAZine decanoate (PROLIXIN) 25 MG/ML injection Inject 32.5 mg into the muscle every 21 ( twenty-one) days.    Yes [provider]  fluticasone (FLONASE) 50 MCG/ACT nasal spray Place 2 sprays into both nostrils daily. Patient taking differently: Place 2 sprays into both nostrils daily as needed for allergies.  05/07/16 05/16/17 Yes Wilson, Alex M, DO  loratadine (CLARITIN) 10 MG tablet TAKE 1 TABLET (10 MG TOTAL) BY MOUTH DAILY. 04/25/17  Yes Holley Raring, MD  omeprazole (PRILOSEC) 20 MG capsule Take 1 capsule (20 mg total) by mouth daily. Patient taking differently: Take 20 mg by mouth daily as needed (reflux).  09/12/16 09/12/17 Yes Holley Raring, MD  potassium chloride (KLOR-CON) 20 MEQ packet Take 20 mEq by mouth 2 (two) times daily.   Yes [provider]  pravastatin (PRAVACHOL) 40 MG tablet TAKE 1 TABLET (40 MG TOTAL) BY MOUTH DAILY. 04/25/17  Yes Holley Raring, MD  triamterene-hydrochlorothiazide (DYAZIDE) 37.5-25 MG capsule TAKE ONE CAPSULE BY MOUTH EVERY DAY 05/08/17  Yes Holley Raring, MD  docusate sodium (COLACE) 100 MG capsule Take 1 capsule (100 mg total) by mouth 2 (two) times daily as needed for mild constipation. 12/31/15   Francesca Oman, DO  hydrocortisone (ANUSOL-HC) 2.5 % rectal cream Apply rectally 2 times daily as needed.  Do not use for more than 1 week at a time. Patient taking differently: Place 1 application rectally 2 (two) times daily  as needed for hemorrhoids. Do not use for more than 1 week at a time. 12/31/15   Francesca Oman, DO  iron polysaccharides (FERREX 150) 150 MG capsule Take 1 capsule (150 mg total) by mouth daily. Patient not taking: Reported on 05/16/2017 05/07/16   Francesca Oman, DO  KLOR-CON M20 20 MEQ tablet TAKE 2 TABLETS (40 MEQ TOTAL) BY MOUTH DAILY. Patient not taking: Reported on 05/16/2017 04/25/17   Holley Raring, MD  ondansetron (ZOFRAN ODT) 4 MG disintegrating tablet Take 1 tablet (4 mg total) by mouth every 8 (eight) hours as needed for nausea or vomiting. 05/16/17   Kinnie Feil, PA-C  polyethylene glycol powder (MIRALAX) powder Take 17 g by mouth daily as needed for mild constipation. 12/31/15   Francesca Oman, DO  Witch Hazel (TUCKS) 50 % PADS Apply 1 application topically 3 (three) times daily as needed. Apply to anorectal area as needed 3 times daily or after bowel movements. 08/11/13   Francesca Oman, DO    Family History Family History  Problem Relation Age of Onset  . Diabetes Maternal Aunt   . Hypertension Mother     Social History Social History  Substance Use Topics  . Smoking status: Never Smoker  . Smokeless tobacco: Never Used  . Alcohol use No     Allergies   Robitussin dm [dextromethorphan-guaifenesin]   Review of Systems Review of Systems  Constitutional: Positive for appetite change. Negative for fever.  HENT: Negative for sore throat.   Respiratory: Negative for cough, choking and shortness of breath.   Cardiovascular: Negative for chest pain and palpitations.  Gastrointestinal: Positive for diarrhea, nausea and vomiting. Negative for abdominal pain and constipation.  Genitourinary: Negative for difficulty urinating, dysuria and hematuria.  Musculoskeletal: Negative for arthralgias and myalgias.  Skin: Negative for rash.  Neurological: Negative for light-headedness and headaches.     Physical Exam Updated Vital Signs BP (!) 123/100   Pulse (!) 101   Temp 97.7 F (36.5 C) (Oral)   Resp 19   SpO2 93%   Physical Exam  Constitutional: He is oriented to person, place, and time. He appears well-developed and well-nourished. No distress.  HENT:  Head: Normocephalic and atraumatic.  Nose: Nose normal.  Moist mucous membranes Tonsils and oropharynx normal  Eyes: Conjunctivae and EOM are normal. Pupils are equal, round, and reactive to light.    Neck: Normal range of motion. Neck supple. No JVD present.  No cervical adenopathy  Cardiovascular: Normal rate, regular rhythm, normal heart sounds and intact distal pulses.   No murmur heard. Pulmonary/Chest: Effort normal and breath sounds normal. No respiratory distress. He has no wheezes. He has no rales.  Abdominal: Soft. Bowel sounds are normal. He exhibits no distension and no mass. There is no tenderness. There is no guarding and no CVA tenderness.  Musculoskeletal: Normal range of motion. He exhibits no deformity.  Neurological: He is alert and oriented to person, place, and time.  Skin: Skin is warm and dry. Capillary refill takes less than 2 seconds.  Psychiatric: He has a normal mood and affect. His behavior is normal. Judgment and thought content normal.  Nursing note and vitals reviewed.    ED Treatments / Results  Labs (all labs ordered are listed, but only abnormal results are displayed) Labs Reviewed  COMPREHENSIVE METABOLIC PANEL - Abnormal; Notable for the following:       Result Value   CO2 20 (*)    Glucose, Bld 148 (*)  Total Protein 9.7 (*)    All other components within normal limits  CBC - Abnormal; Notable for the following:    WBC 11.6 (*)    RBC 5.83 (*)    MCV 77.9 (*)    MCH 24.4 (*)    All other components within normal limits  URINALYSIS, ROUTINE W REFLEX MICROSCOPIC - Abnormal; Notable for the following:    Hgb urine dipstick SMALL (*)    Ketones, ur 5 (*)    Protein, ur 30 (*)    All other components within normal limits  LIPASE, BLOOD    EKG  EKG Interpretation None       Radiology No results found.  Procedures Procedures (including critical care time)  Medications Ordered in ED Medications  sodium chloride 0.9 % bolus 1,000 mL (0 mLs Intravenous Stopped 05/16/17 1320)  ondansetron (ZOFRAN) injection 4 mg (4 mg Intravenous Given 05/16/17 1147)     Initial Impression / Assessment and Plan / ED Course  I have reviewed the  triage vital signs and the nursing notes.  Pertinent labs & imaging results that were available during my care of the patient were reviewed by me and considered in my medical decision making (see chart for details).  Clinical Course as of May 16 1532  Fri May 16, 2017  1117 Patient not in room yet  [CG]    Clinical Course User Index [CG] Kinnie Feil, PA-C   61 year old male presents with sudden onset nausea, nonbloody nonbilious emesis and nonbloody diarrhea hours after eating feel the steak sandwiches from restaurant. No associated fevers, abdominal pain, urinary symptoms. On exam patient is afebrile, vital signs reassuring. Abdomen is soft, nontender. No signs of dehydration. Symptoms have improved since arrival to the ED. Or viral gastroenteritis versus food poisoning.  ED lab work is reassuring. Patient received IV fluids and Zofran. No episodes of emesis or diarrhea in the ED. He tolerated by mouth challenge. Low suspicion for GI emergency. I don't think further emergent lab work imaging is indicated at this time. Patient is considered safe for discharge. We'll advise gentle hydration, Zofran and follow-up with PCP as needed. ED return precautions given. Patient and mother at bedside verbalized understanding and they're agreeable with plan.  Final Clinical Impressions(s) / ED Diagnoses   Final diagnoses:  Nausea vomiting and diarrhea    New Prescriptions Discharge Medication List as of 05/16/2017  2:18 PM    START taking these medications   Details  ondansetron (ZOFRAN ODT) 4 MG disintegrating tablet Take 1 tablet (4 mg total) by mouth every 8 (eight) hours as needed for nausea or vomiting., Starting Fri 05/16/2017, Print         Kinnie Feil, PA-C 05/16/17 1533    Pattricia Boss, MD 05/19/17 2135

## 2017-05-16 NOTE — ED Triage Notes (Addendum)
Pt with PMHx of MR and is deaf; pt brought in by mother with c/o patient reporting nausea, vomiting and diarrhea, onset this morning. Pt denies any abdominal pain.

## 2017-05-16 NOTE — ED Notes (Signed)
Went up front for patient, no response in the lobby

## 2017-05-16 NOTE — ED Notes (Addendum)
Pt given fluids PO  

## 2017-05-16 NOTE — ED Notes (Signed)
ED Provider at bedside. 

## 2017-05-20 ENCOUNTER — Encounter: Payer: Self-pay | Admitting: Internal Medicine

## 2017-05-20 ENCOUNTER — Ambulatory Visit (INDEPENDENT_AMBULATORY_CARE_PROVIDER_SITE_OTHER): Payer: Medicare Other | Admitting: Internal Medicine

## 2017-05-20 VITALS — BP 130/80 | HR 74 | Temp 97.7°F | Ht 68.0 in | Wt 209.3 lb

## 2017-05-20 DIAGNOSIS — H913 Deaf nonspeaking, not elsewhere classified: Secondary | ICD-10-CM | POA: Diagnosis not present

## 2017-05-20 DIAGNOSIS — E876 Hypokalemia: Secondary | ICD-10-CM

## 2017-05-20 DIAGNOSIS — R112 Nausea with vomiting, unspecified: Secondary | ICD-10-CM

## 2017-05-20 DIAGNOSIS — Z5189 Encounter for other specified aftercare: Secondary | ICD-10-CM

## 2017-05-20 MED ORDER — POTASSIUM CHLORIDE CRYS ER 20 MEQ PO TBCR: 20.0000 meq | EXTENDED_RELEASE_TABLET | Freq: Every day | ORAL | 2 refills | Status: DC

## 2017-05-20 NOTE — Patient Instructions (Addendum)
I recommend changing the potassium pills to one pill daily. I am glad your nausea and vomiting have improved so much.  Let us know if the symptoms get worse again otherwise you can see your regular doctor for a routine visit.

## 2017-05-20 NOTE — Progress Notes (Signed)
   CC: Follow up from ED visit for vomiting  HPI:  Leonard Tucker is a 61 y.o. man here for follow up of a recent ED visit for nausea and vomiting. His symptoms are much improved but also has a question about his potassium supplement.  History is provided by patient and his mother, who is also helping as a sign language interpreter.  See problem based assessment and plan below for additional details  Past Medical History:  Diagnosis Date  . Chronic constipation   . Deaf-mutism   . Dental caries   . History of iron deficiency anemia   . Hypertriglyceridemia   . Intertrigo   . MR (mental retardation)   . Schizophrenia, paranoid, chronic ([place])   . Umbilical hernia     Review of Systems:  Review of Systems  Constitutional: Negative for chills and fever.  Respiratory: Negative for shortness of breath.   Cardiovascular: Negative for chest pain.  Gastrointestinal: Positive for diarrhea, nausea and vomiting.    Physical Exam: Physical Exam  Constitutional: He is well-developed, well-nourished, and in no distress.  HENT:  Mouth/Throat: Oropharynx is clear and moist. No oropharyngeal exudate.  Cardiovascular: Normal rate and regular rhythm.   Abdominal: Soft. There is no rebound and no guarding.  Mild tenderness at the right upper and lower quadrant  Musculoskeletal: He exhibits no edema.  Neurological:  Deaf, mute    Vitals:   05/20/17 0920  BP: 130/80  Pulse: 74  Temp: 97.7 F (36.5 C)  TempSrc: Oral  SpO2: 100%  Weight: 209 lb 4.8 oz (94.9 kg)  Height: 5\' 8"  (1.727 m)    Assessment & Plan:   See Encounters Tab for problem based charting.  Patient discussed with Dr. Lynnae January

## 2017-05-21 NOTE — Progress Notes (Signed)
Internal Medicine Clinic Attending  Case discussed with Dr. Rice at the time of the visit.  We reviewed the resident's history and exam and pertinent patient test results.  I agree with the assessment, diagnosis, and plan of care documented in the resident's note.  

## 2017-05-21 NOTE — Assessment & Plan Note (Signed)
HPI: He was recently seen at the ED for extensive nausea and vomiting with abdominal pain that improved after antiemetics and observation. He was tolerating liquids by the time he returned home, and is now tolerating solid food as well. He still has several doses of the prescribed Zofran with him but is not needing them due to improved symptoms. He has had no ongoing fever or chills. He does have mild abdominal pain in multiple locations.  A: Self limited acute enteritis, resolving  P: No new interventions recommended today

## 2017-05-21 NOTE — Assessment & Plan Note (Signed)
HPI: He has difficulty taking twice daily potassium supplements, and according to his mother has diarrhea and upset stomach whenever he takes a second dose per day. He takes triamterene-HCTZ 37.5-25mg  daily. He has chronically been low to low-normal potassium without overt symptoms. His last potassium was 3.8 when checked in the ED.  A: Hypokalemia with difficulty taking twice daily supplementation I think it is reasonable to offer just once daily medicine because his potassium is currently normal, and he has been mildly low many times in the past without complications.  P: Decrease supplemental potassium to 3mEq PO daily We can recheck chemistry in future visit but does not need urgent follow up for this

## 2017-07-08 DIAGNOSIS — F209 Schizophrenia, unspecified: Secondary | ICD-10-CM | POA: Diagnosis not present

## 2017-07-23 ENCOUNTER — Other Ambulatory Visit: Payer: Self-pay | Admitting: *Deleted

## 2017-07-23 DIAGNOSIS — I10 Essential (primary) hypertension: Secondary | ICD-10-CM

## 2017-07-23 MED ORDER — ATENOLOL 25 MG PO TABS: 25.0000 mg | ORAL_TABLET | Freq: Every day | ORAL | 3 refills | Status: DC

## 2017-08-07 ENCOUNTER — Encounter: Payer: Medicare Other | Admitting: Internal Medicine

## 2017-09-10 ENCOUNTER — Telehealth: Payer: Self-pay

## 2017-09-10 NOTE — Telephone Encounter (Signed)
Pt mother is calling to speak with a nurse about cough. Please call back.

## 2017-09-10 NOTE — Telephone Encounter (Signed)
Caregiver states pt has been using children's cough syrup and doing well but in am on arising his throat hurts, denies fevers. Also states in the am he has "running" of his eyes, clean warm bath cloth over eyes in am Offered appt before 10/24 she states they will wait until the appt will call if fever develops

## 2017-09-24 ENCOUNTER — Ambulatory Visit (INDEPENDENT_AMBULATORY_CARE_PROVIDER_SITE_OTHER): Payer: Medicare Other | Admitting: Internal Medicine

## 2017-09-24 ENCOUNTER — Encounter: Payer: Self-pay | Admitting: Internal Medicine

## 2017-09-24 VITALS — BP 141/85 | HR 82 | Temp 98.0°F | Ht 68.0 in | Wt 208.4 lb

## 2017-09-24 DIAGNOSIS — J029 Acute pharyngitis, unspecified: Secondary | ICD-10-CM

## 2017-09-24 DIAGNOSIS — K5909 Other constipation: Secondary | ICD-10-CM | POA: Diagnosis not present

## 2017-09-24 DIAGNOSIS — Z23 Encounter for immunization: Secondary | ICD-10-CM

## 2017-09-24 DIAGNOSIS — J3089 Other allergic rhinitis: Secondary | ICD-10-CM

## 2017-09-24 DIAGNOSIS — Z79899 Other long term (current) drug therapy: Secondary | ICD-10-CM

## 2017-09-24 DIAGNOSIS — R4701 Aphasia: Secondary | ICD-10-CM

## 2017-09-24 DIAGNOSIS — K219 Gastro-esophageal reflux disease without esophagitis: Secondary | ICD-10-CM

## 2017-09-24 DIAGNOSIS — J301 Allergic rhinitis due to pollen: Secondary | ICD-10-CM

## 2017-09-24 DIAGNOSIS — Z6831 Body mass index (BMI) 31.0-31.9, adult: Secondary | ICD-10-CM

## 2017-09-24 DIAGNOSIS — F79 Unspecified intellectual disabilities: Secondary | ICD-10-CM | POA: Diagnosis not present

## 2017-09-24 DIAGNOSIS — I10 Essential (primary) hypertension: Secondary | ICD-10-CM

## 2017-09-24 DIAGNOSIS — R0981 Nasal congestion: Secondary | ICD-10-CM | POA: Diagnosis not present

## 2017-09-24 DIAGNOSIS — H9202 Otalgia, left ear: Secondary | ICD-10-CM | POA: Diagnosis not present

## 2017-09-24 DIAGNOSIS — E669 Obesity, unspecified: Secondary | ICD-10-CM | POA: Diagnosis not present

## 2017-09-24 MED ORDER — LORATADINE 10 MG PO TABS: 10.0000 mg | ORAL_TABLET | Freq: Every day | ORAL | 3 refills | Status: DC

## 2017-09-24 MED ORDER — OMEPRAZOLE 20 MG PO CPDR: 20.0000 mg | DELAYED_RELEASE_CAPSULE | Freq: Every day | ORAL | 1 refills | Status: DC

## 2017-09-24 MED ORDER — FLUTICASONE PROPIONATE 50 MCG/ACT NA SUSP: 2.0000 | Freq: Every day | NASAL | 2 refills | Status: DC

## 2017-09-24 NOTE — Progress Notes (Signed)
   CC: Nasal Congestion  HPI:  [name] is a 61 y.o. male with a PMHx significant for mental retardation and mutism who presented today with complaints of nasal congestion, left ear discomfort, and throat pain. He has had this discomfort for several days. He takes Loratidine and Fluticasone nasal spray as needed which helps when he does use them. He endorses itchy eyes but denies any nasal discharge, cough, fevers, chills, myalgias, arthralgias, or headaches. He does have a history of GERD. He is requesting his flu shot today.   As for his other chronic medical conditions he is compliant with his medications. His mom voices concerns about the difficulty of controlling his diet. She states that he goes to an respite care three days a week and his eats a lot of junk food those days. Unfortunately he has gained almost 20 lbs over the past year as a result. Finance is also a limiting factor for them and limits their healthy food options at home.   Past Medical History:  Diagnosis Date  . Chronic constipation   . Deaf-mutism   . Dental caries   . History of iron deficiency anemia   . Hypertriglyceridemia   . Intertrigo   . MR (mental retardation)   . Schizophrenia, paranoid, chronic ([place])   . Umbilical hernia    Review of Systems:   General: Denies HA, fatigue Pulm: Denies cough, SOB  MSK: Denies arthralgias, myalgias   Physical Exam: Vitals:   09/24/17 1329  BP: (!) 141/85  Pulse: 82  Temp: 98 F (36.7 C)  TempSrc: Oral  SpO2: 97%  Weight: 208 lb 6.4 oz (94.5 kg)  Height: 5\' 8"  (1.727 m)   General: Obese male in no acute distress HENT: Ear canals clear with no cerumen noted. TM not erythematous with good light reflex. Nare mucosa boggy and pale with some erythema, no purulent discharge noted. Oropharynx clear with no erythema or exudates. No LAD Pulm:  Good air movement with no wheezing or crackles  CV: RRR, no murmurs, no rubs  Extremities: No LE edema   Assessment &  Plan:   See Encounters Tab for problem based charting.  Patient seen with Dr. Eppie Gibson

## 2017-09-24 NOTE — Patient Instructions (Addendum)
It was a pleasure to meet you today. Today we made the following changes:  - Start taking your Fluticasone daily   - Starting taking your Loratadine daily   - Start taking your omeprazole daily

## 2017-09-24 NOTE — Assessment & Plan Note (Addendum)
Current symptoms along with the PE findings of boggy pale nares lead me to believe this is a result of allergic rhinitis. He inconsistently uses his fluticasone nasal spray and loratadine. I encouraged the use of these on a daily basis. If things do not improve he will follow-up.   Plan: - Fluticasone nasal spray daily  - Loratadine daily

## 2017-09-24 NOTE — Assessment & Plan Note (Addendum)
His BP was elevated today and his ASCVD risk is 15.5%. He would benefit from tighter BP control. He is currently on Triamterene-HCTZ 37.5-25 mg QD. His weight gain is a significant contributor to his elevated BP. At his next visit we will add on Lisinopril. We will defer amlodipine at this point as he struggle with chronic constipation.   Plan: - Continue Triamterene-HCTZ 37.5-25 mg QD - Next visit if elevated BP add on Lisinopril

## 2017-09-24 NOTE — Assessment & Plan Note (Signed)
Asked to refill the patient's Omeprazole. Prescription sent out.

## 2017-10-06 NOTE — Progress Notes (Signed)
Patient ID: Leonard Tucker, male   DOB: [birth year], 61 y.o.   MRN: [id number]  I saw and evaluated the patient.  I personally confirmed the key portions of Dr. Tarri Abernethy history and exam and reviewed pertinent patient test results.  The assessment, diagnosis, and plan were formulated together and I agree with the documentation in the resident's note.

## 2017-10-06 NOTE — Addendum Note (Signed)
Addended by: Oval Linsey D on: 10/06/2017 10:31 AM   Modules accepted: Level of Service

## 2017-11-06 ENCOUNTER — Other Ambulatory Visit: Payer: Self-pay | Admitting: *Deleted

## 2017-11-06 DIAGNOSIS — I10 Essential (primary) hypertension: Secondary | ICD-10-CM

## 2017-11-07 MED ORDER — TRIAMTERENE-HCTZ 37.5-25 MG PO CAPS: 1.0000 | ORAL_CAPSULE | Freq: Every day | ORAL | 1 refills | Status: DC

## 2017-12-09 DIAGNOSIS — F209 Schizophrenia, unspecified: Secondary | ICD-10-CM | POA: Diagnosis not present

## 2017-12-30 ENCOUNTER — Other Ambulatory Visit: Payer: Self-pay | Admitting: *Deleted

## 2017-12-31 NOTE — Telephone Encounter (Signed)
pravastatin (PRAVACHOL) 40 MG tablet, Refill request.

## 2018-01-01 MED ORDER — PRAVASTATIN SODIUM 40 MG PO TABS: 40.0000 mg | ORAL_TABLET | Freq: Every day | ORAL | 5 refills | Status: DC

## 2018-02-16 ENCOUNTER — Other Ambulatory Visit: Payer: Self-pay

## 2018-02-16 DIAGNOSIS — Y999 Unspecified external cause status: Secondary | ICD-10-CM | POA: Diagnosis not present

## 2018-02-16 DIAGNOSIS — S40012A Contusion of left shoulder, initial encounter: Secondary | ICD-10-CM | POA: Diagnosis not present

## 2018-02-16 DIAGNOSIS — Y9389 Activity, other specified: Secondary | ICD-10-CM | POA: Diagnosis not present

## 2018-02-16 DIAGNOSIS — Z79899 Other long term (current) drug therapy: Secondary | ICD-10-CM | POA: Diagnosis not present

## 2018-02-16 DIAGNOSIS — I10 Essential (primary) hypertension: Secondary | ICD-10-CM | POA: Insufficient documentation

## 2018-02-16 DIAGNOSIS — Y828 Other medical devices associated with adverse incidents: Secondary | ICD-10-CM | POA: Diagnosis not present

## 2018-02-16 DIAGNOSIS — M7981 Nontraumatic hematoma of soft tissue: Secondary | ICD-10-CM | POA: Diagnosis not present

## 2018-02-16 DIAGNOSIS — M25512 Pain in left shoulder: Secondary | ICD-10-CM | POA: Diagnosis not present

## 2018-02-16 DIAGNOSIS — Y92018 Other place in single-family (private) house as the place of occurrence of the external cause: Secondary | ICD-10-CM | POA: Diagnosis not present

## 2018-02-16 DIAGNOSIS — S4992XA Unspecified injury of left shoulder and upper arm, initial encounter: Secondary | ICD-10-CM | POA: Diagnosis present

## 2018-02-16 DIAGNOSIS — M79602 Pain in left arm: Secondary | ICD-10-CM | POA: Diagnosis not present

## 2018-02-17 ENCOUNTER — Emergency Department (HOSPITAL_COMMUNITY): Payer: Medicare Other

## 2018-02-17 ENCOUNTER — Emergency Department (HOSPITAL_COMMUNITY)
Admission: EM | Admit: 2018-02-17 | Discharge: 2018-02-17 | Disposition: A | Discharge status: Home / Self Care | Payer: Medicare Other | Attending: Emergency Medicine | Admitting: Emergency Medicine

## 2018-02-17 ENCOUNTER — Encounter (HOSPITAL_COMMUNITY): Payer: Self-pay | Admitting: Emergency Medicine

## 2018-02-17 DIAGNOSIS — M25512 Pain in left shoulder: Secondary | ICD-10-CM | POA: Diagnosis not present

## 2018-02-17 DIAGNOSIS — T148XXA Other injury of unspecified body region, initial encounter: Secondary | ICD-10-CM

## 2018-02-17 MED ORDER — IBUPROFEN 600 MG PO TABS: 600.0000 mg | ORAL_TABLET | Freq: Four times a day (QID) | ORAL | 0 refills | Status: DC | PRN

## 2018-02-17 MED ORDER — IBUPROFEN 400 MG PO TABS
600.0000 mg | ORAL_TABLET | Freq: Once | ORAL | Status: AC
  Administered 2018-02-17: 600 mg via ORAL
  Filled 2018-02-17: qty 1

## 2018-02-17 NOTE — ED Provider Notes (Signed)
Leonard Tucker Provider Note   CSN: [id number] Arrival date & time: 02/16/18  2259     History   Chief Complaint Chief Complaint  Patient presents with  . Shoulder Pain    HPI Leonard Tucker is a 62 y.o. male.  Patient brought in by his mother who is caregiver for evaluation of a painful lump on the upper left arm. She reports he got his monthly Prolixin shot there several days ago. No fall or injury. No fever or pain elsewhere. No nausea or vomiting.    The history is provided by a parent.  Shoulder Pain   Pertinent negatives include no numbness.    Past Medical History:  Diagnosis Date  . Chronic constipation   . Deaf-mutism   . Dental caries   . History of iron deficiency anemia   . Hypertriglyceridemia   . Intertrigo   . MR (mental retardation)   . Schizophrenia, paranoid, chronic ([place])   . Umbilical hernia     Patient Active Problem List   Diagnosis Date Noted  . Screening for colon cancer 02/10/2013  . Iron deficiency anemia due to chronic blood loss 11/01/2011  . Fungal infection of toenail 08/21/2011  . GERD (gastroesophageal reflux disease) 06/28/2011  . HYPOKALEMIA 03/27/2010  . Allergic rhinitis 10/18/2009  . Constipation 05/19/2007  . Dyslipidemia 11/18/2006  . PARANOID SCHIZOPHRENIA, CHRONIC 10/13/2006  . MENTAL RETARDATION 10/13/2006  . DEAF MUTISM 10/13/2006  . Essential hypertension 10/13/2006  . Dental caries 10/13/2006    History reviewed. No pertinent surgical history.     Home Medications    Prior to Admission medications   Medication Sig Start Date End Date Taking? Authorizing Provider  atenolol (TENORMIN) 25 MG tablet Take 1 tablet (25 mg total) by mouth daily. 07/23/17   Sid Falcon, MD  Dextromethorphan-Guaifenesin (CHILDRENS COUGH) 5-100 MG/5ML LIQD Take 2.5 mLs by mouth every 6 (six) hours as needed (cough).    [provider]  docusate sodium (COLACE) 100 MG capsule Take 1  capsule (100 mg total) by mouth 2 (two) times daily as needed for mild constipation. 12/31/15   Francesca Oman, DO  fluPHENAZine decanoate (PROLIXIN) 25 MG/ML injection Inject 32.5 mg into the muscle every 21 ( twenty-one) days.     [provider]  fluticasone (FLONASE) 50 MCG/ACT nasal spray Place 2 sprays into both nostrils daily. 09/24/17 10/03/18  Ina Homes, MD  hydrocortisone (ANUSOL-HC) 2.5 % rectal cream Apply rectally 2 times daily as needed.  Do not use for more than 1 week at a time. Patient taking differently: Place 1 application rectally 2 (two) times daily as needed for hemorrhoids. Do not use for more than 1 week at a time. 12/31/15   Francesca Oman, DO  ibuprofen (ADVIL,MOTRIN) 600 MG tablet Take 1 tablet (600 mg total) by mouth every 6 (six) hours as needed. 02/17/18   Charlann Lange, PA-C  iron polysaccharides (FERREX 150) 150 MG capsule Take 1 capsule (150 mg total) by mouth daily. Patient not taking: Reported on 05/16/2017 05/07/16   Francesca Oman, DO  loratadine (CLARITIN) 10 MG tablet Take 1 tablet (10 mg total) by mouth daily. 09/24/17   Ina Homes, MD  omeprazole (PRILOSEC) 20 MG capsule Take 1 capsule (20 mg total) by mouth daily. 09/24/17 09/24/18  Ina Homes, MD  ondansetron (ZOFRAN ODT) 4 MG disintegrating tablet Take 1 tablet (4 mg total) by mouth every 8 (eight) hours as needed for nausea or vomiting.  05/16/17   Kinnie Feil, PA-C  polyethylene glycol powder (MIRALAX) powder Take 17 g by mouth daily as needed for mild constipation. 12/31/15   Francesca Oman, DO  potassium chloride (KLOR-CON) 20 MEQ packet Take 20 mEq by mouth 2 (two) times daily.    [provider]  potassium chloride SA (KLOR-CON M20) 20 MEQ tablet Take 1 tablet (20 mEq total) by mouth daily. 05/20/17   Collier Salina, MD  pravastatin (PRAVACHOL) 40 MG tablet Take 1 tablet (40 mg total) by mouth daily. 01/01/18   Ina Homes, MD  triamterene-hydrochlorothiazide  (DYAZIDE) 37.5-25 MG capsule Take 1 each (1 capsule total) by mouth daily. 11/07/17   Ina Homes, MD  Witch Hazel (TUCKS) 50 % PADS Apply 1 application topically 3 (three) times daily as needed. Apply to anorectal area as needed 3 times daily or after bowel movements. 08/11/13   Francesca Oman, DO    Family History Family History  Problem Relation Age of Onset  . Diabetes Maternal Aunt   . Hypertension Mother     Social History Social History   Tobacco Use  . Smoking status: Never Smoker  . Smokeless tobacco: Never Used  Substance Use Topics  . Alcohol use: No    Alcohol/week: 0.0 oz  . Drug use: No     Allergies   Robitussin dm [dextromethorphan-guaifenesin]   Review of Systems Review of Systems  Constitutional: Negative for fever.  Gastrointestinal: Negative for nausea.  Musculoskeletal:       See HPI.  Skin: Negative for color change and wound.  Neurological: Negative for weakness and numbness.     Physical Exam Updated Vital Signs BP (!) 142/94   Pulse 72   Temp 98.2 F (36.8 C)   Resp 17   SpO2 99%   Physical Exam  Constitutional: He is oriented to person, place, and time. He appears well-developed and well-nourished.  Neck: Normal range of motion.  Cardiovascular: Intact distal pulses.  Pulmonary/Chest: Effort normal.  Musculoskeletal: Normal range of motion.  FROM left shoulder. No deformity. No redness or warmth. There is a small mildly tender hematoma over body of deltoid.   Neurological: He is alert and oriented to person, place, and time.  Skin: Skin is warm and dry.  Psychiatric: He has a normal mood and affect.     ED Treatments / Results  Labs (all labs ordered are listed, but only abnormal results are displayed) Labs Reviewed - No data to display  EKG  EKG Interpretation None       Radiology Dg Shoulder Left  Result Date: 02/17/2018 CLINICAL DATA:  Initial evaluation for acute pain, knot over left scapula. EXAM: LEFT  SHOULDER - 2+ VIEW COMPARISON:  None. FINDINGS: No acute fracture or dislocation. Humeral head in normal alignment with the glenoid. AC joints approximated. No osseous abnormality seen about the left scapula. No acute soft tissue abnormality. Atelectatic changes noted at the left lung base. IMPRESSION: No acute osseous abnormality about the left shoulder. Electronically Signed   By: Jeannine Boga M.D.   On: 02/17/2018 01:08    Procedures Procedures (including critical care time)  Medications Ordered in ED Medications  ibuprofen (ADVIL,MOTRIN) tablet 600 mg (not administered)     Initial Impression / Assessment and Plan / ED Course  I have reviewed the triage vital signs and the nursing notes.  Pertinent labs & imaging results that were available during my care of the patient were reviewed by me and considered in  my medical decision making (see chart for details).     Patient having pain at injection site on upper left arm. No fever. There is a small tender hematoma at this location without evidence of cellulitis, abscess or infection. Recommended warm compresses and ibuprofen. Stable for discharge.   Final Clinical Impressions(s) / ED Diagnoses   Final diagnoses:  Hematoma    ED Discharge Orders        Ordered    ibuprofen (ADVIL,MOTRIN) 600 MG tablet  Every 6 hours PRN     02/17/18 0602       Charlann Lange, PA-C 02/17/18 5643    Ripley Fraise, MD 02/17/18 [phone]

## 2018-02-17 NOTE — ED Notes (Signed)
No deformity noted at this time.

## 2018-02-17 NOTE — ED Triage Notes (Signed)
Per patients mother pt c/o L shoulder pain with a reported "knot" near scapula. Pt she reports pt had Prolixin injection 3/14 in same arm. Pt is nonverbal, hearing impaired. Pt grimaces with palpation to deltoid and scapula area.  Mother denies any knowledge of injury

## 2018-05-10 ENCOUNTER — Other Ambulatory Visit: Payer: Self-pay | Admitting: Internal Medicine

## 2018-06-18 ENCOUNTER — Other Ambulatory Visit: Payer: Self-pay | Admitting: Internal Medicine

## 2018-06-18 DIAGNOSIS — J3089 Other allergic rhinitis: Secondary | ICD-10-CM

## 2018-08-18 ENCOUNTER — Telehealth: Payer: Self-pay | Admitting: Internal Medicine

## 2018-08-18 NOTE — Telephone Encounter (Signed)
rtc to mother, she states pt has had diarrhea and a cough with fever at intervals for appr 1 week, appt made at her request for wed in [facility], pt coughing in background, she is advised if pt becomes worse to go to urg care or ED, she is agreeable. States she has used some OTC meds but not helping

## 2018-08-18 NOTE — Telephone Encounter (Signed)
Caregiver Mother Lelan Pons) concerned that the pt has had diarrhea x 1 week and it has not stopped.  She has been giving the pt over the counter medicine and he still has diarrhea.

## 2018-08-19 ENCOUNTER — Other Ambulatory Visit: Payer: Self-pay

## 2018-08-19 ENCOUNTER — Ambulatory Visit (INDEPENDENT_AMBULATORY_CARE_PROVIDER_SITE_OTHER): Payer: Medicare Other | Admitting: Internal Medicine

## 2018-08-19 VITALS — BP 131/86 | HR 76 | Temp 98.4°F | Ht 68.0 in | Wt 206.4 lb

## 2018-08-19 DIAGNOSIS — I10 Essential (primary) hypertension: Secondary | ICD-10-CM | POA: Diagnosis not present

## 2018-08-19 DIAGNOSIS — Z862 Personal history of diseases of the blood and blood-forming organs and certain disorders involving the immune mechanism: Secondary | ICD-10-CM | POA: Diagnosis not present

## 2018-08-19 DIAGNOSIS — Z23 Encounter for immunization: Secondary | ICD-10-CM | POA: Diagnosis not present

## 2018-08-19 DIAGNOSIS — D500 Iron deficiency anemia secondary to blood loss (chronic): Secondary | ICD-10-CM

## 2018-08-19 DIAGNOSIS — H913 Deaf nonspeaking, not elsewhere classified: Secondary | ICD-10-CM

## 2018-08-19 DIAGNOSIS — R197 Diarrhea, unspecified: Secondary | ICD-10-CM | POA: Diagnosis not present

## 2018-08-19 DIAGNOSIS — Z79899 Other long term (current) drug therapy: Secondary | ICD-10-CM

## 2018-08-19 NOTE — Assessment & Plan Note (Addendum)
And had an history of iron deficiency anemia, No recent follow-up.  - CBC and ferritin level.  Addendum.  Patient has hemoglobin of 12.7 with MCV of 78, but his ferritin is 240. I will check iron panel for further differentiation.

## 2018-08-19 NOTE — Progress Notes (Signed)
   CC: Diarrhea for past 1 week.  HPI:  [name] is a 62 y.o. with past medical history as listed below came to the clinic with his mother with concerns of diarrhea for past 1 week.  Nonverbal patient who was accompanied by her mother explaining his symptoms.  Apparently patient developed mild nasal congestion and cough a week ago which is now improving, later he developed diarrhea, having 2 loose bowel movements every day for the past 5 days, last bowel movement was yesterday evening.  No bowel movement since this morning. Denies any abdominal pain. No fever or chills. No nausea or vomiting. His appetite is normal. He has no urinary symptoms.  Past Medical History:  Diagnosis Date  . Chronic constipation   . Deaf-mutism   . Dental caries   . History of iron deficiency anemia   . Hypertriglyceridemia   . Intertrigo   . MR (mental retardation)   . Schizophrenia, paranoid, chronic ([place])   . Umbilical hernia    Review of Systems: Negative except mentioned in HPI.  Physical Exam:  Vitals:   08/19/18 1101  BP: 131/86  Pulse: 76  Temp: 98.4 F (36.9 C)  TempSrc: Oral  SpO2: 99%  Weight: 206 lb 6.4 oz (93.6 kg)  Height: 5\' 8"  (1.727 m)   General: Vital signs reviewed.  Patient is well-developed and well-nourished, in no acute distress and cooperative with exam.  Head: Normocephalic and atraumatic. Cardiovascular: RRR, S1 normal, S2 normal, no murmurs, gallops, or rubs. Pulmonary/Chest: Clear to auscultation bilaterally, no wheezes, rales, or rhonchi. Abdominal: Soft, non-tender, non-distended, BS +, no masses, organomegaly, or guarding present.  Extremities: No lower extremity edema bilaterally,  pulses symmetric and intact bilaterally. No cyanosis or clubbing. Skin: Warm, dry and intact. No rashes or erythema. Psychiatric: Normal mood and affect. speech and behavior is normal. Cognition and memory are normal.  Assessment & Plan:   See Encounters Tab for problem  based charting.  Patient discussed with Dr. Angelia Mould.

## 2018-08-19 NOTE — Patient Instructions (Addendum)
Thank you for visiting clinic today. I think your diarrhea is also improving, do not take your stool softener unless you needed for constipation. You can take just 1 of your potassium supplement tablet if needed. Keep yourself well-hydrated. We are checking some labs today, will call you with any abnormal results. Please follow-up with your PCP according to your scheduled appointment.

## 2018-08-19 NOTE — Assessment & Plan Note (Signed)
Patient was having diarrhea most likely has a part of his recent viral illness which started with mild cough and nasal congestion which is now improving.  Patient was also on sodium decussate twice daily.  Advised the mother that stopped giving him sodium because it and only use it as needed. Just keep him well-hydrated. There is no need for Imodium at this time as his symptoms are improving and there is no other red flag symptoms.

## 2018-08-19 NOTE — Assessment & Plan Note (Signed)
BP Readings from Last 3 Encounters:  08/19/18 131/86  02/17/18 139/87  09/24/17 (!) 141/85   His blood pressure was within goal.  Continue current management.

## 2018-08-20 LAB — BMP8+ANION GAP
Anion Gap: 18 mmol/L (ref 10.0–18.0)
BUN / CREAT RATIO: 11 (ref 10–24)
BUN: 10 mg/dL (ref 8–27)
CHLORIDE: 103 mmol/L (ref 96–106)
CO2: 23 mmol/L (ref 20–29)
Calcium: 9.3 mg/dL (ref 8.6–10.2)
Creatinine, Ser: 0.95 mg/dL (ref 0.76–1.27)
GFR calc non Af Amer: 87 mL/min/{1.73_m2} (ref >59)
GFR, EST AFRICAN AMERICAN: 100 mL/min/{1.73_m2} (ref >59)
GLUCOSE: 93 mg/dL (ref 65–99)
POTASSIUM: 3.5 mmol/L (ref 3.5–5.2)
Sodium: 144 mmol/L (ref 134–144)

## 2018-08-20 LAB — FERRITIN: Ferritin: 240 ng/mL (ref 30–400)

## 2018-08-20 LAB — CBC
HEMATOCRIT: 40.1 % (ref 37.5–51.0)
Hemoglobin: 12.7 g/dL — ABNORMAL LOW (ref 13.0–17.7)
MCH: 24.8 pg — AB (ref 26.6–33.0)
MCHC: 31.7 g/dL (ref 31.5–35.7)
MCV: 78 fL — ABNORMAL LOW (ref 79–97)
Platelets: 242 10*3/uL (ref 150–450)
RBC: 5.12 x10E6/uL (ref 4.14–5.80)
RDW: 15.2 % (ref 12.3–15.4)
WBC: 5.7 10*3/uL (ref 3.4–10.8)

## 2018-08-20 NOTE — Addendum Note (Signed)
Addended by: Lorella Nimrod on: 08/20/2018 11:58 AM   Modules accepted: Orders

## 2018-08-21 LAB — IRON AND TIBC
IRON: 60 ug/dL (ref 38–169)
Iron Saturation: 21 % (ref 15–55)
Total Iron Binding Capacity: 288 ug/dL (ref 250–450)
UIBC: 228 ug/dL (ref 111–343)

## 2018-08-21 LAB — SPECIMEN STATUS REPORT

## 2018-08-21 NOTE — Progress Notes (Signed)
Internal Medicine Clinic Attending  Case discussed with Dr. Amin at the time of the visit.  We reviewed the resident's history and exam and pertinent patient test results.  I agree with the assessment, diagnosis, and plan of care documented in the resident's note.    

## 2018-09-02 ENCOUNTER — Other Ambulatory Visit: Payer: Self-pay | Admitting: Internal Medicine

## 2018-10-06 ENCOUNTER — Other Ambulatory Visit: Payer: Self-pay | Admitting: Internal Medicine

## 2018-10-06 DIAGNOSIS — E876 Hypokalemia: Secondary | ICD-10-CM

## 2018-10-08 NOTE — Telephone Encounter (Signed)
RX for KCL sent via [facility] did not go through per Pharmacist at [facility].  RX given verbally off of Dr. Jerrell Mylar written RX. [place], RN

## 2018-10-31 ENCOUNTER — Other Ambulatory Visit: Payer: Self-pay | Admitting: Internal Medicine

## 2018-10-31 DIAGNOSIS — J3089 Other allergic rhinitis: Secondary | ICD-10-CM

## 2018-11-02 ENCOUNTER — Other Ambulatory Visit: Payer: Self-pay | Admitting: Internal Medicine

## 2018-11-02 DIAGNOSIS — I10 Essential (primary) hypertension: Secondary | ICD-10-CM

## 2018-12-17 ENCOUNTER — Encounter: Payer: Medicaid Other | Admitting: Internal Medicine

## 2019-02-25 ENCOUNTER — Other Ambulatory Visit: Payer: Self-pay

## 2019-02-25 ENCOUNTER — Ambulatory Visit: Payer: Medicaid Other | Admitting: Internal Medicine

## 2019-02-25 DIAGNOSIS — I10 Essential (primary) hypertension: Secondary | ICD-10-CM

## 2019-02-25 NOTE — Progress Notes (Signed)
Attempted to call patient for telephone encounter. No answer. No charge.

## 2019-03-08 ENCOUNTER — Telehealth: Payer: Self-pay | Admitting: *Deleted

## 2019-03-08 NOTE — Telephone Encounter (Signed)
NURSING TRIAGE NOTE FOR RESPIRATORY SYMPTOMS  Do you have a fever? No  Do you have a cough?yes, productive, clear thick   Do you have shortness of breath more than normal? No  Do you have chest pain?no  Are you able to eat and drink normally?yes   Have you seen a physician for these symptoms?no    Action informed mother of all to do and not to do when speaking to her, pt is deaf mute  I informed patient to expect a phone call from a physician soon and I sent request to front desk to schedule a virtual office appt for patient and arrive the patient.

## 2019-03-24 ENCOUNTER — Other Ambulatory Visit: Payer: Self-pay | Admitting: Internal Medicine

## 2019-03-24 DIAGNOSIS — J3089 Other allergic rhinitis: Secondary | ICD-10-CM

## 2019-04-06 ENCOUNTER — Other Ambulatory Visit: Payer: Self-pay | Admitting: Internal Medicine

## 2019-04-06 DIAGNOSIS — E876 Hypokalemia: Secondary | ICD-10-CM

## 2019-07-07 ENCOUNTER — Other Ambulatory Visit: Payer: Self-pay | Admitting: Internal Medicine

## 2019-07-07 DIAGNOSIS — I10 Essential (primary) hypertension: Secondary | ICD-10-CM

## 2019-07-27 ENCOUNTER — Other Ambulatory Visit: Payer: Self-pay | Admitting: Internal Medicine

## 2019-07-27 DIAGNOSIS — J301 Allergic rhinitis due to pollen: Secondary | ICD-10-CM

## 2019-08-05 ENCOUNTER — Encounter: Payer: Medicaid Other | Admitting: Internal Medicine

## 2019-09-09 ENCOUNTER — Encounter: Payer: Medicaid Other | Admitting: Internal Medicine

## 2019-09-23 ENCOUNTER — Ambulatory Visit (INDEPENDENT_AMBULATORY_CARE_PROVIDER_SITE_OTHER): Payer: Medicare Other | Admitting: *Deleted

## 2019-09-23 DIAGNOSIS — Z23 Encounter for immunization: Secondary | ICD-10-CM

## 2020-04-06 ENCOUNTER — Encounter: Payer: Self-pay | Admitting: *Deleted

## 2020-05-08 ENCOUNTER — Ambulatory Visit: Payer: Medicare Other

## 2020-05-10 ENCOUNTER — Ambulatory Visit: Payer: Medicare Other

## 2020-05-25 ENCOUNTER — Other Ambulatory Visit: Payer: Self-pay | Admitting: Internal Medicine

## 2020-05-25 DIAGNOSIS — I10 Essential (primary) hypertension: Secondary | ICD-10-CM

## 2020-07-12 ENCOUNTER — Other Ambulatory Visit: Payer: Self-pay | Admitting: Student

## 2020-07-12 DIAGNOSIS — K5900 Constipation, unspecified: Secondary | ICD-10-CM

## 2020-07-12 NOTE — Telephone Encounter (Signed)
Pls contact pt regarding pt eating habits [phone]

## 2020-07-12 NOTE — Telephone Encounter (Signed)
Good Afternoon!  Because the patient has not been seen in our clinic for 2 years, I do not feel comfortable refilling their medications at this time. Is there anyway that they can have an appointment with me sooner than August 31st? I will be more than happy to refill their medications once I meet the patient and fully evaluate them.   Thank you!  Vasili Aalaya Yadao

## 2020-07-12 NOTE — Telephone Encounter (Signed)
Pt mom wants pt to get a refill on cough medicine, hydrocortisone (ANUSOL-HC) 2.5 % rectal cream polyethylene glycol powder (MIRALAX) powder  [id number] [place] [place] [address]

## 2020-07-13 NOTE — Telephone Encounter (Signed)
Pt appt 07/20/2020 @ 8:45.

## 2020-07-20 ENCOUNTER — Other Ambulatory Visit: Payer: Self-pay

## 2020-07-20 ENCOUNTER — Encounter: Payer: Self-pay | Admitting: Student

## 2020-07-20 ENCOUNTER — Ambulatory Visit (INDEPENDENT_AMBULATORY_CARE_PROVIDER_SITE_OTHER): Payer: Medicare Other | Admitting: Student

## 2020-07-20 VITALS — BP 106/66 | HR 67 | Temp 98.9°F | Wt 198.1 lb

## 2020-07-20 DIAGNOSIS — Z0000 Encounter for general adult medical examination without abnormal findings: Secondary | ICD-10-CM

## 2020-07-20 DIAGNOSIS — D500 Iron deficiency anemia secondary to blood loss (chronic): Secondary | ICD-10-CM | POA: Diagnosis not present

## 2020-07-20 DIAGNOSIS — J301 Allergic rhinitis due to pollen: Secondary | ICD-10-CM

## 2020-07-20 DIAGNOSIS — I10 Essential (primary) hypertension: Secondary | ICD-10-CM

## 2020-07-20 DIAGNOSIS — E876 Hypokalemia: Secondary | ICD-10-CM

## 2020-07-20 MED ORDER — FLUTICASONE PROPIONATE 50 MCG/ACT NA SUSP: NASAL | 2 refills | Status: DC

## 2020-07-20 NOTE — Assessment & Plan Note (Signed)
Last potassium was 3.5 in 2019. Will recheck today. Suspected source of hypokalemia is his tria-hctz blood pressure medication.   Patient has not been taking klor-con as it has given him the sided effect of diarrhea. If hypokalemic, will change blood pressure medicine and reassess  A/P  Rechecking potassium today If patient hypokalemic will change bp regimen.

## 2020-07-20 NOTE — Assessment & Plan Note (Signed)
Patient's mother is unsure about COVID vaccine. Would like to discuss at next visit.   A/P - Will discuss vaccination at next visit.

## 2020-07-20 NOTE — Assessment & Plan Note (Signed)
Last hemoglobin of 12.7 with an MCV of 78. Will recheck CBC today.   A/P Recheck CBC today Will base plan on these results.

## 2020-07-20 NOTE — Progress Notes (Signed)
   CC: Meet New PCP  HPI:  [name] is a 64 y.o. with a PMHx of mental retardation, deaf-mutism, HTN, HLD, hypokalemia, and history of iron deficiency anemia who presents to the clinic with his mother to meet his new PCP. His mother interpreted for the patient. They deny any new complaints at this time. The patient has not been taking his klorcon because it has caused him to have episodes of diarrhea.   Mother states patient wakes up with phlegm stuck in his throat that is too thick for him to cough up. Mother notes that patient has taken robitussin in the past and that has helped. He had an allergic reaction the robitussin DM however. Mother also notes patient is out of his nasal spray.   When asked about the COVID vaccine the patient's mother notes she is still unsure and would like to discuss the vaccination at the next visit.   Past Medical History:  Diagnosis Date  . Chronic constipation   . Deaf-mutism   . Dental caries   . History of iron deficiency anemia   . Hypertriglyceridemia   . Intertrigo   . MR (mental retardation)   . Schizophrenia, paranoid, chronic ([place])   . Umbilical hernia    Review of Systems:  As per HPI  Physical Exam:  Vitals:   07/20/20 0914  BP: 106/66  Pulse: 67  Temp: 98.9 F (37.2 C)  TempSrc: Oral  SpO2: 100%  Weight: 198 lb 1.6 oz (89.9 kg)   Physical Exam Vitals and nursing note reviewed.  Constitutional:      Appearance: Normal appearance.  Eyes:     Extraocular Movements: Extraocular movements intact.  Cardiovascular:     Rate and Rhythm: Normal rate and regular rhythm.     Pulses: Normal pulses.     Heart sounds: No murmur heard.  No friction rub. No gallop.   Pulmonary:     Effort: Pulmonary effort is normal. No respiratory distress.     Breath sounds: Normal breath sounds. No stridor. No wheezing, rhonchi or rales.  Musculoskeletal:     Cervical back: Normal range of motion.  Neurological:     General: No focal deficit  present.     Mental Status: He is alert. Mental status is at baseline.     Assessment & Plan:   See Encounters Tab for problem based charting.  Patient seen and discussed with Dr. Philipp Ovens

## 2020-07-20 NOTE — Assessment & Plan Note (Signed)
Blood pressure of 106/66 today. Patient's current regimen of atenolol 25 mg and triamterene-hctz 37.5-25. In the past patient has been hypokalemic which was attributed to his triam-hctz. He has been unable to tolerate his klorkon pills due to diarrhea side effect. His potassium will be check today and if it continues to be low, will consider switching patient from triam-hctz to an arb.   A/P - Continue current BP regimen of triamterene-hctz 37.5-25 mg and atenolol 25 mg.  - If potassium low, will switch from tria-hctz to arb.

## 2020-07-20 NOTE — Patient Instructions (Signed)
Thank you for allowing [place] to assist in your healthcare! It was nice meeting you today!  Today we discussed:   Blood Pressure - Your blood pressure was in goal today! We will continue your current medications. If your potassium is low, we will consider changing your blood pressure medication  History of Low Potassium - We will check your potassium level today!  Morning Phlegm - Please pickup over the counter robitussin (not DM)  Thank you!  If you have any questions please do not hesitate to call our office.

## 2020-07-20 NOTE — Assessment & Plan Note (Addendum)
Patient states he is out of fluticasone spray. Patient also notes waking up with phlegm he is unable to cough up. Mother notes robitussin in the past help with this. Patient unable to take robitussin DM, instructed to pickup over the counter robitussin without DM.   A/P - Continue daily fluticasone spray, will reorder.

## 2020-07-21 LAB — CBC
Hematocrit: 38.6 % (ref 37.5–51.0)
Hemoglobin: 11.8 g/dL — ABNORMAL LOW (ref 13.0–17.7)
MCH: 23.5 pg — ABNORMAL LOW (ref 26.6–33.0)
MCHC: 30.6 g/dL — ABNORMAL LOW (ref 31.5–35.7)
MCV: 77 fL — ABNORMAL LOW (ref 79–97)
Platelets: 259 10*3/uL (ref 150–450)
RBC: 5.02 x10E6/uL (ref 4.14–5.80)
RDW: 15.5 % — ABNORMAL HIGH (ref 11.6–15.4)
WBC: 7.8 10*3/uL (ref 3.4–10.8)

## 2020-07-21 LAB — BMP8+ANION GAP
Anion Gap: 16 mmol/L (ref 10.0–18.0)
BUN/Creatinine Ratio: 13 (ref 10–24)
BUN: 13 mg/dL (ref 8–27)
CO2: 24 mmol/L (ref 20–29)
Calcium: 9.5 mg/dL (ref 8.6–10.2)
Chloride: 101 mmol/L (ref 96–106)
Creatinine, Ser: 1.02 mg/dL (ref 0.76–1.27)
GFR calc Af Amer: 91 mL/min/{1.73_m2} (ref >59)
GFR calc non Af Amer: 78 mL/min/{1.73_m2} (ref >59)
Glucose: 81 mg/dL (ref 65–99)
Potassium: 3.8 mmol/L (ref 3.5–5.2)
Sodium: 141 mmol/L (ref 134–144)

## 2020-07-21 NOTE — Progress Notes (Signed)
Internal Medicine Clinic Attending  I saw and evaluated the patient.  I personally confirmed the key portions of the history and exam documented by Dr. Katsadouros and I reviewed pertinent patient test results.  The assessment, diagnosis, and plan were formulated together and I agree with the documentation in the resident's note.  

## 2020-07-26 ENCOUNTER — Other Ambulatory Visit: Payer: Self-pay | Admitting: *Deleted

## 2020-07-26 DIAGNOSIS — I10 Essential (primary) hypertension: Secondary | ICD-10-CM

## 2020-07-26 MED ORDER — ATENOLOL 25 MG PO TABS: 25.0000 mg | ORAL_TABLET | Freq: Every day | ORAL | 3 refills | Status: DC

## 2020-08-01 ENCOUNTER — Encounter: Payer: Medicare Other | Admitting: Student

## 2020-09-22 ENCOUNTER — Ambulatory Visit (INDEPENDENT_AMBULATORY_CARE_PROVIDER_SITE_OTHER): Payer: Medicare Other | Admitting: Internal Medicine

## 2020-09-22 ENCOUNTER — Other Ambulatory Visit: Payer: Self-pay

## 2020-09-22 ENCOUNTER — Telehealth: Payer: Self-pay

## 2020-09-22 ENCOUNTER — Encounter: Payer: Self-pay | Admitting: Internal Medicine

## 2020-09-22 DIAGNOSIS — K648 Other hemorrhoids: Secondary | ICD-10-CM

## 2020-09-22 DIAGNOSIS — K644 Residual hemorrhoidal skin tags: Secondary | ICD-10-CM | POA: Diagnosis not present

## 2020-09-22 DIAGNOSIS — D500 Iron deficiency anemia secondary to blood loss (chronic): Secondary | ICD-10-CM | POA: Diagnosis not present

## 2020-09-22 DIAGNOSIS — K5900 Constipation, unspecified: Secondary | ICD-10-CM

## 2020-09-22 HISTORY — DX: Other hemorrhoids: K64.8

## 2020-09-22 MED ORDER — POLYETHYLENE GLYCOL 3350 17 GM/SCOOP PO POWD: 17.0000 g | Freq: Every day | ORAL | 1 refills | Status: DC | PRN

## 2020-09-22 MED ORDER — POLYSACCHARIDE IRON COMPLEX 150 MG PO CAPS: 150.0000 mg | ORAL_CAPSULE | Freq: Every day | ORAL | 1 refills | Status: DC

## 2020-09-22 NOTE — Telephone Encounter (Signed)
Returned call to patient's mom. States 2-3 episodes of blood in toilet after patient has a BM and 2 episodes this week. Patient with hx of constipation, hemorrhoids and anemia due to blood loss. Given Tele appt today with [facility]. Hubbard Hartshorn, BSN, RN-BC

## 2020-09-22 NOTE — Assessment & Plan Note (Signed)
Mr.Eaglin, 64 yo M w/ PMH of intellectual disability and deaf/mutism, presenting to [facility] via telehealth visit. He has hx of hemorrhoids and his mother confirms presence of hemorrhoids and anal irritation associated with these bleeding events. She also mentions episodes of constipation prior to bleeding.  A/P Bright red-blood per rectum likely due to exacerbation of known external hemorrhoids. Advised to continue hemorrhoidal cream and observe for now. Will need to check cbc to assess for anemia. She agrees to f/u with lab visit next week.  - C/w hydrocortisone cream - F/u in clinic for cbc

## 2020-09-22 NOTE — Assessment & Plan Note (Signed)
Lab Results  Component Value Date   WBC 7.8 07/20/2020   HGB 11.8 (L) 07/20/2020   HCT 38.6 07/20/2020   MCV 77 (L) 07/20/2020   PLT 259 07/20/2020   Present w/ rectal bleed. Chart review shows prior hx of iron deficiency anemia. When reviewing home medications, noted to be missing iron supplementation. Iron and ferritin not checked for > 12 months. Advised to resume oral iron supplementation  - cbc, iron, ferritin in lab visit - Resume iron polysaccharide

## 2020-09-22 NOTE — Telephone Encounter (Signed)
Pls contact pt mother regarding pt bowel movements [phone]

## 2020-09-22 NOTE — Progress Notes (Signed)
  [facility] Telephone Encounter Continuity Care Appointment  HPI:   This telephone encounter was created for Leonard Tucker on 09/22/2020 for the following purpose/cc rectal bleed.  LeonardKloss is a 64 yo male with deaf/mutism and intellectual disability presenting with rectal bleed. History was obtained via his primary care taker, his mother Leonard Tucker. He was in his usual state of health until earlier this week when he was noted to have 2-3 episodes of small volume bright red blood after bowel movement in the toilet. She mentions seeing hemorrhoids on his rectum and bought over the counter hemorrhoidal cream this morning and bleeding resolved this morning. She states he was also complaining of rectal irritation which again resolved after applying hemorrhoidal cream. She mentions being concerned of the bleed and wanted to speak with someone.   Past Medical History:  Past Medical History:  Diagnosis Date  . Chronic constipation   . Deaf-mutism   . Dental caries   . History of iron deficiency anemia   . Hypertriglyceridemia   . Intertrigo   . MR (mental retardation)   . Schizophrenia, paranoid, chronic ([place])   . Umbilical hernia       ROS:  Review of Systems  Constitutional: Negative for chills, fever and malaise/fatigue.  Respiratory: Negative for shortness of breath.   Cardiovascular: Negative for chest pain and leg swelling.  Gastrointestinal: Positive for blood in stool. Negative for abdominal pain, constipation, diarrhea, melena, nausea and vomiting.  Genitourinary: Negative for dysuria.  All other systems reviewed and are negative.    Assessment / Plan / Recommendations:   Please see A&P under problem oriented charting for assessment of the patient's acute and chronic medical conditions.   As always, pt is advised that if symptoms worsen or new symptoms arise, they should go to an urgent care facility or to to ER for further evaluation.    Consent and Medical Decision Making:   Patient discussed with Dr. Dareen Piano  This is a telephone encounter between Philis Pique and [name] on 09/22/2020 for bright red blood per rectum. The visit was conducted with the patient located at home and [name] at [facility]. The patient's identity was confirmed using their DOB and current address. The patient has consented to being evaluated through a telephone encounter and understands the associated risks (an examination cannot be done and the patient may need to come in for an appointment) / benefits (allows the patient to remain at home, decreasing exposure to coronavirus). I personally spent 13 minutes on medical discussion.

## 2020-09-22 NOTE — Assessment & Plan Note (Signed)
Pt requires refills on medications with associated diagnosis above.  Reviewed disease process and find this medication to be necessary, will not change dose or alter current therapy. 

## 2020-09-25 NOTE — Progress Notes (Signed)
Internal Medicine Clinic Attending  Case discussed with Dr. Lee  At the time of the visit.  We reviewed the resident's history and exam and pertinent patient test results.  I agree with the assessment, diagnosis, and plan of care documented in the resident's note.    

## 2020-09-26 ENCOUNTER — Other Ambulatory Visit: Payer: Self-pay | Admitting: *Deleted

## 2020-09-26 ENCOUNTER — Other Ambulatory Visit: Payer: Self-pay

## 2020-09-26 DIAGNOSIS — J3089 Other allergic rhinitis: Secondary | ICD-10-CM

## 2020-09-26 MED ORDER — LORATADINE 10 MG PO TABS: 10.0000 mg | ORAL_TABLET | Freq: Every day | ORAL | 3 refills | Status: DC

## 2020-09-26 MED ORDER — PRAVASTATIN SODIUM 40 MG PO TABS
40.0000 mg | ORAL_TABLET | Freq: Every day | ORAL | 1 refills | Status: DC
Start: 2020-09-26 — End: 2020-09-27

## 2020-09-26 NOTE — Telephone Encounter (Signed)
Need refill on loratadine (CLARITIN) 10 MG tablet  pravastatin (PRAVACHOL) 40 MG tablet ;pt contact [phone]    [id number] [place], [place] - [address]

## 2020-09-27 ENCOUNTER — Ambulatory Visit (INDEPENDENT_AMBULATORY_CARE_PROVIDER_SITE_OTHER): Payer: Medicare Other | Admitting: Internal Medicine

## 2020-09-27 ENCOUNTER — Encounter: Payer: Self-pay | Admitting: Internal Medicine

## 2020-09-27 VITALS — BP 127/68 | HR 80 | Temp 98.1°F | Ht 68.0 in | Wt 194.7 lb

## 2020-09-27 DIAGNOSIS — K644 Residual hemorrhoidal skin tags: Secondary | ICD-10-CM | POA: Diagnosis not present

## 2020-09-27 DIAGNOSIS — Z23 Encounter for immunization: Secondary | ICD-10-CM

## 2020-09-27 DIAGNOSIS — D500 Iron deficiency anemia secondary to blood loss (chronic): Secondary | ICD-10-CM

## 2020-09-27 DIAGNOSIS — E785 Hyperlipidemia, unspecified: Secondary | ICD-10-CM | POA: Diagnosis not present

## 2020-09-27 MED ORDER — PRAVASTATIN SODIUM 40 MG PO TABS: 40.0000 mg | ORAL_TABLET | Freq: Every day | ORAL | 3 refills | Status: DC

## 2020-09-27 NOTE — Progress Notes (Signed)
   CC: Rectal bleed  HPI: [name] is a 64 y.o. with PMH listed below presenting with complaint of rectal bleed. Please see problem based assessment and plan for further details.  Past Medical History:  Diagnosis Date  . Chronic constipation   . Deaf-mutism   . Dental caries   . History of iron deficiency anemia   . Hypertriglyceridemia   . Intertrigo   . MR (mental retardation)   . Schizophrenia, paranoid, chronic ([place])   . Umbilical hernia    Review of Systems: Review of Systems  Constitutional: Negative for chills, fever and malaise/fatigue.  Eyes: Negative for blurred vision.  Respiratory: Negative for cough and shortness of breath.   Cardiovascular: Negative for chest pain, palpitations and leg swelling.  Gastrointestinal: Positive for blood in stool. Negative for constipation, diarrhea, nausea and vomiting.  Musculoskeletal: Negative for back pain and neck pain.  All other systems reviewed and are negative.    Physical Exam: Vitals:   09/27/20 1358  BP: 127/68  Pulse: 80  Temp: 98.1 F (36.7 C)  TempSrc: Oral  SpO2: 100%  Weight: 194 lb 11.2 oz (88.3 kg)  Height: 5\' 8"  (1.727 m)   Gen: Well-developed, well nourished, NAD HEENT: NCAT head, deaf CV: RRR, S1, S2 normal Pulm: CTAB, No rales, no wheezes Rectal: Large external hemorrhoids without active bleed or tenderness or erythema Extm: ROM intact, Peripheral pulses intact, No peripheral edema Skin: Dry, Warm, normal turgor, no wounds, no rashes, no lesions  Assessment & Plan:   External hemorrhoids [name] is a 64 yo M w/ PMH of intellectual disability and deaf/mutism presenting to [facility] for follow up visit for rectal bleed. History was obtained with assistance from his mother who provides sign language translation. He had couple episodes of blood in the toilet last week which resolved after his mother used OTC hemorrhoidal cream. Since then he has had no further episodes. Denies any anal irritation or  tenderness.  A/P Exam confirms presence of large external hemorrhoids. No evidence of thrombosis. Acute bleeding episode appears to have resolved.   - C/w hydrocortisone cream - Advised on using otc stool softeners  Dyslipidemia Pt requires refills on medications with associated diagnosis above.  Reviewed disease process and find this medication to be necessary, will not change dose or alter current therapy.   Need for immunization against influenza Agree to receive flu vaccine this clinic visit  Iron deficiency anemia due to chronic blood loss Was prescribed iron supplements in the past but has not picked up the most recent refill. Previous hemoglobin at 11.8 with MCV of 77. No recent iron, ferritin.  - Iron, ferritin, cbc - Advised to restart iron polysaccharide   Patient discussed with Dr. Jimmye Norman  -Leonard Tucker, [facility] Pager: [phone]

## 2020-09-27 NOTE — Patient Instructions (Addendum)
Thank you for allowing [place] to provide your care today. Today we discussed your rectal bleed    I have ordered cbc, ferritin,  labs for you. I will call if any are abnormal.    Today we made the following changes to your medications.    Please make sure to resume your iron supplementation  Please follow-up as needed.    Should you have any questions or concerns please call the internal medicine clinic at [phone].      Hemorrhoids Hemorrhoids are swollen veins that may develop:  In the butt (rectum). These are called internal hemorrhoids.  Around the opening of the butt (anus). These are called external hemorrhoids. Hemorrhoids can cause pain, itching, or bleeding. Most of the time, they do not cause serious problems. They usually get better with diet changes, lifestyle changes, and other home treatments. What are the causes? This condition may be caused by:  Having trouble pooping (constipation).  Pushing hard (straining) to poop.  Watery poop (diarrhea).  Pregnancy.  Being very overweight (obese).  Sitting for long periods of time.  Heavy lifting or other activity that causes you to strain.  Anal sex.  Riding a bike for a long period of time. What are the signs or symptoms? Symptoms of this condition include:  Pain.  Itching or soreness in the butt.  Bleeding from the butt.  Leaking poop.  Swelling in the area.  One or more lumps around the opening of your butt. How is this diagnosed? A doctor can often diagnose this condition by looking at the affected area. The doctor may also:  Do an exam that involves feeling the area with a gloved hand (digital rectal exam).  Examine the area inside your butt using a small tube (anoscope).  Order blood tests. This may be done if you have lost a lot of blood.  Have you get a test that involves looking inside the colon using a flexible tube with a camera on the end (sigmoidoscopy or colonoscopy). How is this  treated? This condition can usually be treated at home. Your doctor may tell you to change what you eat, make lifestyle changes, or try home treatments. If these do not help, procedures can be done to remove the hemorrhoids or make them smaller. These may involve:  Placing rubber bands at the base of the hemorrhoids to cut off their blood supply.  Injecting medicine into the hemorrhoids to shrink them.  Shining a type of light energy onto the hemorrhoids to cause them to fall off.  Doing surgery to remove the hemorrhoids or cut off their blood supply. Follow these instructions at home: Eating and drinking   Eat foods that have a lot of fiber in them. These include whole grains, beans, nuts, fruits, and vegetables.  Ask your doctor about taking products that have added fiber (fibersupplements).  Reduce the amount of fat in your diet. You can do this by: ? Eating low-fat dairy products. ? Eating less red meat. ? Avoiding processed foods.  Drink enough fluid to keep your pee (urine) pale yellow. Managing pain and swelling   Take a warm-water bath (sitz bath) for 20 minutes to ease pain. Do this 3-4 times a day. You may do this in a bathtub or using a portable sitz bath that fits over the toilet.  If told, put ice on the painful area. It may be helpful to use ice between your warm baths. ? Put ice in a plastic bag. ? Place a  towel between your skin and the bag. ? Leave the ice on for 20 minutes, 2-3 times a day. General instructions  Take over-the-counter and prescription medicines only as told by your doctor. ? Medicated creams and medicines may be used as told.  Exercise often. Ask your doctor how much and what kind of exercise is best for you.  Go to the bathroom when you have the urge to poop. Do not wait.  Avoid pushing too hard when you poop.  Keep your butt dry and clean. Use wet toilet paper or moist towelettes after pooping.  Do not sit on the toilet for a long  time.  Keep all follow-up visits as told by your doctor. This is important. Contact a doctor if you:  Have pain and swelling that do not get better with treatment or medicine.  Have trouble pooping.  Cannot poop.  Have pain or swelling outside the area of the hemorrhoids. Get help right away if you have:  Bleeding that will not stop. Summary  Hemorrhoids are swollen veins in the butt or around the opening of the butt.  They can cause pain, itching, or bleeding.  Eat foods that have a lot of fiber in them. These include whole grains, beans, nuts, fruits, and vegetables.  Take a warm-water bath (sitz bath) for 20 minutes to ease pain. Do this 3-4 times a day. This information is not intended to replace advice given to you by your health care provider. Make sure you discuss any questions you have with your health care provider. Document Revised: 11/26/2018 Document Reviewed: 04/09/2018 Elsevier Patient Education  [place].

## 2020-09-29 ENCOUNTER — Encounter: Payer: Self-pay | Admitting: Internal Medicine

## 2020-09-29 LAB — CBC
Hematocrit: 38.9 % (ref 37.5–51.0)
Hemoglobin: 11.7 g/dL — ABNORMAL LOW (ref 13.0–17.7)
MCH: 22.9 pg — ABNORMAL LOW (ref 26.6–33.0)
MCHC: 30.1 g/dL — ABNORMAL LOW (ref 31.5–35.7)
MCV: 76 fL — ABNORMAL LOW (ref 79–97)
Platelets: 272 10*3/uL (ref 150–450)
RBC: 5.1 x10E6/uL (ref 4.14–5.80)
RDW: 14.6 % (ref 11.6–15.4)
WBC: 7 10*3/uL (ref 3.4–10.8)

## 2020-09-29 LAB — IRON AND TIBC
Iron Saturation: 13 % — ABNORMAL LOW (ref 15–55)
Iron: 44 ug/dL (ref 38–169)
Total Iron Binding Capacity: 338 ug/dL (ref 250–450)
UIBC: 294 ug/dL (ref 111–343)

## 2020-09-29 LAB — FERRITIN: Ferritin: 45 ng/mL (ref 30–400)

## 2020-09-29 NOTE — Assessment & Plan Note (Signed)
Was prescribed iron supplements in the past but has not picked up the most recent refill. Previous hemoglobin at 11.8 with MCV of 77. No recent iron, ferritin.  - Iron, ferritin, cbc - Advised to restart iron polysaccharide

## 2020-09-29 NOTE — Assessment & Plan Note (Signed)
Pt requires refills on medications with associated diagnosis above.  Reviewed disease process and find this medication to be necessary, will not change dose or alter current therapy. 

## 2020-09-29 NOTE — Assessment & Plan Note (Signed)
Agree to receive flu vaccine this clinic visit

## 2020-09-29 NOTE — Assessment & Plan Note (Addendum)
Mr.Heuring is a 64 yo M w/ PMH of intellectual disability and deaf/mutism presenting to [facility] for follow up visit for rectal bleed. History was obtained with assistance from his mother who provides sign language translation. He had couple episodes of blood in the toilet last week which resolved after his mother used OTC hemorrhoidal cream. Since then he has had no further episodes. Denies any anal irritation or tenderness.  A/P Exam confirms presence of large external hemorrhoids. No evidence of thrombosis. Acute bleeding episode appears to have resolved.   - C/w hydrocortisone cream - Advised on using otc stool softeners

## 2020-10-02 NOTE — Progress Notes (Signed)
Internal Medicine Clinic Attending  Case discussed with Dr. Lee  At the time of the visit.  We reviewed the resident's history and exam and pertinent patient test results.  I agree with the assessment, diagnosis, and plan of care documented in the resident's note.    

## 2020-10-04 ENCOUNTER — Ambulatory Visit (INDEPENDENT_AMBULATORY_CARE_PROVIDER_SITE_OTHER): Payer: Medicare Other | Admitting: Student

## 2020-10-04 ENCOUNTER — Other Ambulatory Visit: Payer: Self-pay

## 2020-10-04 VITALS — BP 110/73 | HR 77 | Temp 98.3°F | Wt 194.6 lb

## 2020-10-04 DIAGNOSIS — Z114 Encounter for screening for human immunodeficiency virus [HIV]: Secondary | ICD-10-CM

## 2020-10-04 DIAGNOSIS — Z0000 Encounter for general adult medical examination without abnormal findings: Secondary | ICD-10-CM

## 2020-10-04 DIAGNOSIS — Z1159 Encounter for screening for other viral diseases: Secondary | ICD-10-CM

## 2020-10-04 DIAGNOSIS — K644 Residual hemorrhoidal skin tags: Secondary | ICD-10-CM | POA: Diagnosis not present

## 2020-10-04 NOTE — Progress Notes (Signed)
CC: Hemorrhoids - Follow up  HPI:  [name] is a 64 y.o. male with a past medical history stated below and presents today for hemorrhoid follow up and healthcare maintenance. Please see problem based assessment and plan for additional details.  Past Medical History:  Diagnosis Date  . Chronic constipation   . Deaf-mutism   . Dental caries   . History of iron deficiency anemia   . Hypertriglyceridemia   . Intertrigo   . MR (mental retardation)   . Schizophrenia, paranoid, chronic ([place])   . Umbilical hernia     Current Outpatient Medications on File Prior to Visit  Medication Sig Dispense Refill  . atenolol (TENORMIN) 25 MG tablet Take 1 tablet (25 mg total) by mouth daily. 90 tablet 3  . docusate sodium (COLACE) 100 MG capsule Take 1 capsule (100 mg total) by mouth 2 (two) times daily as needed for mild constipation. 60 capsule 1  . fluPHENAZine decanoate (PROLIXIN) 25 MG/ML injection Inject 32.5 mg into the muscle every 21 ( twenty-one) days.  (Patient not taking: Reported on 09/27/2020)    . fluticasone (FLONASE) 50 MCG/ACT nasal spray SPRAY 2 SPRAYS INTO EACH NOSTRIL EVERY DAY 16 mL 2  . hydrocortisone (ANUSOL-HC) 2.5 % rectal cream Apply rectally 2 times daily as needed.  Do not use for more than 1 week at a time. (Patient taking differently: Place 1 application rectally 2 (two) times daily as needed for hemorrhoids. Do not use for more than 1 week at a time.) 30 g 0  . iron polysaccharides (FERREX 150) 150 MG capsule Take 1 capsule (150 mg total) by mouth daily. 90 capsule 1  . loratadine (CLARITIN) 10 MG tablet Take 1 tablet (10 mg total) by mouth daily. 30 tablet 3  . polyethylene glycol powder (MIRALAX) 17 GM/SCOOP powder Take 17 g by mouth daily as needed for mild constipation. 250 g 1  . pravastatin (PRAVACHOL) 40 MG tablet Take 1 tablet (40 mg total) by mouth daily. 90 tablet 3  . triamterene-hydrochlorothiazide (DYAZIDE) 37.5-25 MG capsule TAKE 1 CAPSULE BY MOUTH  EVERY DAY 90 capsule 1  . Witch Hazel (TUCKS) 50 % PADS Apply 1 application topically 3 (three) times daily as needed. Apply to anorectal area as needed 3 times daily or after bowel movements. 1 each 2   No current facility-administered medications on file prior to visit.    Family History  Problem Relation Age of Onset  . Diabetes Maternal Aunt   . Hypertension Mother     Social History   Socioeconomic History  . Marital status: Single    Spouse name: Not on file  . Number of children: Not on file  . Years of education: Not on file  . Highest education level: Not on file  Occupational History  . Not on file  Tobacco Use  . Smoking status: Never Smoker  . Smokeless tobacco: Never Used  Substance and Sexual Activity  . Alcohol use: No    Alcohol/week: 0.0 standard drinks  . Drug use: No  . Sexual activity: Not on file  Other Topics Concern  . Not on file  Social History Narrative   His mother, Leonard Tucker is his primary caretaker and he lives with her.    Communicates via sign language          Social Determinants of Health   Financial Resource Strain:   . Difficulty of Paying Living Expenses: Not on file  Food Insecurity:   .  Worried About Charity fundraiser in the Last Year: Not on file  . Ran Out of Food in the Last Year: Not on file  Transportation Needs:   . Lack of Transportation (Medical): Not on file  . Lack of Transportation (Non-Medical): Not on file  Physical Activity:   . Days of Exercise per Week: Not on file  . Minutes of Exercise per Session: Not on file  Stress:   . Feeling of Stress : Not on file  Social Connections:   . Frequency of Communication with Friends and Family: Not on file  . Frequency of Social Gatherings with Friends and Family: Not on file  . Attends Religious Services: Not on file  . Active Member of Clubs or Organizations: Not on file  . Attends Archivist Meetings: Not on file  . Marital Status: Not on file    Intimate Partner Violence:   . Fear of Current or Ex-Partner: Not on file  . Emotionally Abused: Not on file  . Physically Abused: Not on file  . Sexually Abused: Not on file    Review of Systems: ROS negative except for what is noted on the assessment and plan.  Vitals:   10/04/20 1042  BP: 110/73  Pulse: 77  Temp: 98.3 F (36.8 C)  TempSrc: Oral  SpO2: 100%  Weight: 194 lb 9.6 oz (88.3 kg)   Physical Exam: Physical Exam Vitals and nursing note reviewed.  Constitutional:      Appearance: Normal appearance.  HENT:     Head: Normocephalic and atraumatic.  Pulmonary:     Effort: Pulmonary effort is normal. No respiratory distress.  Skin:    General: Skin is warm and dry.  Neurological:     General: No focal deficit present.     Mental Status: He is alert and oriented to person, place, and time. Mental status is at baseline.  Psychiatric:        Mood and Affect: Mood normal.        Behavior: Behavior normal.     Assessment & Plan:   See Encounters Tab for problem based charting.  Patient discussed with Dr. Delano Metz, D.O. [facility], PGY-2 Pager: [phone], Phone: [phone] Date 10/04/2020 Time 4:46 PM

## 2020-10-04 NOTE — Assessment & Plan Note (Signed)
Assessment: Patient endorses improvement of his hemorrhoids with little to no bleeding occuring   Plan: Continue prior plan, otc hemorrhoidal cream

## 2020-10-04 NOTE — Patient Instructions (Signed)
Thank you, [name] for allowing [place] to provide your care today. Today we discussed health maintenance of HIV and Hepatitis C screening.    I have ordered the following labs for you:   Lab Orders     Hepatitis C antibody     HIV antibody (with reflex)   Referrals ordered today:   Referral Orders  No referral(s) requested today     I have ordered the following medication/changed the following medications:   Stop the following medications: There are no discontinued medications.   Start the following medications: No orders of the defined types were placed in this encounter.    Follow up: 6 months    Remember: Think about receiving your COVID vaccine.   Should you have any questions or concerns please call the internal medicine clinic at [phone].     Sanjuana Letters, D.O. [place]

## 2020-10-04 NOTE — Assessment & Plan Note (Signed)
Patient received hepatitis C and HIV screening this visit. Mother endorses her and the patient will receive COVID vaccine later this month.

## 2020-10-05 LAB — HEPATITIS C ANTIBODY: Hep C Virus Ab: 0.1 s/co ratio (ref 0.0–0.9)

## 2020-10-05 LAB — HIV ANTIBODY (ROUTINE TESTING W REFLEX): HIV Screen 4th Generation wRfx: NONREACTIVE

## 2020-10-10 NOTE — Progress Notes (Signed)
Internal Medicine Clinic Attending  I saw and evaluated the patient.  I personally confirmed the key portions of the history and exam documented by Dr. Katsadouros and I reviewed pertinent patient test results.  The assessment, diagnosis, and plan were formulated together and I agree with the documentation in the resident's note.  

## 2020-10-13 ENCOUNTER — Other Ambulatory Visit: Payer: Self-pay | Admitting: Student

## 2020-10-13 DIAGNOSIS — J301 Allergic rhinitis due to pollen: Secondary | ICD-10-CM

## 2020-11-22 DIAGNOSIS — E871 Hypo-osmolality and hyponatremia: Secondary | ICD-10-CM | POA: Diagnosis not present

## 2021-01-05 ENCOUNTER — Encounter: Payer: Self-pay | Admitting: *Deleted

## 2021-01-05 NOTE — Progress Notes (Signed)

## 2021-01-09 NOTE — Progress Notes (Signed)
Things That May Be Affecting Your Health:  Alcohol  Hearing loss  Pain    Depression  Home Safety  Sexual Health   Diabetes  Lack of physical activity  Stress   Difficulty with daily activities  Loneliness  Tiredness   Drug use  Medicines  Tobacco use   Falls  Motor Vehicle Safety  Weight   Food choices  Oral Health  Other    YOUR PERSONALIZED HEALTH PLAN : 1. Schedule your next subsequent Medicare Wellness visit in one year 2. Attend all of your regular appointments to address your medical issues 3. Complete the preventative screenings and services   Annual Wellness Visit   Medicare Covered Preventative Screenings and [place] Men and Women Who How Often Need? Date of Last Service Action  Abdominal Aortic Aneurysm Adults with AAA risk factors Once     Alcohol Misuse and Counseling All Adults Screening once a year if no alcohol misuse. Counseling up to 4 face to face sessions.     Bone Density Measurement  Adults at risk for osteoporosis Once every 2 yrs     Lipid Panel Z13.6 All adults without CV disease Once every 5 yrs X    Colorectal Cancer   Stool sample or  Colonoscopy All adults 74 and older   Once every year  Every 10 years     Depression All Adults Once a year  Today   Diabetes Screening Blood glucose, post glucose load, or GTT Z13.1  All adults at risk  Pre-diabetics  Once per year  Twice per year X    Diabetes  Self-Management Training All adults Diabetics 10 hrs first year; 2 hours subsequent years. Requires Copay     Glaucoma  Diabetics  Family history of glaucoma  African Americans 76 yrs +  Hispanic Americans 65 yrs + Annually - requires coppay     Hepatitis C Z72.89 or F19.20  High Risk for HCV  Born between 1945 and 1965  Annually  Once     HIV Z11.4 All adults based on risk  Annually btw ages 37 & 30 regardless of risk  Annually > 65 yrs if at increased risk     Lung Cancer Screening Asymptomatic adults aged  73-77 with 30 pack yr history and current smoker OR quit within the last 15 yrs Annually Must have counseling and shared decision making documentation before first screen     Medical Nutrition Therapy Adults with   Diabetes  Renal disease  Kidney transplant within past 3 yrs 3 hours first year; 2 hours subsequent years     Obesity and Counseling All adults Screening once a year Counseling if BMI 30 or higher  Today   Tobacco Use Counseling Adults who use tobacco  Up to 8 visits in one year     Vaccines Z23  Hepatitis B  Influenza   Pneumonia  Adults   Once  Once every flu season  Two different vaccines separated by one year     Next Annual Wellness Visit People with Medicare Every year  Today     Services & Screenings Women Who How Often Need  Date of Last Service Action  Mammogram  Z12.31 Women over 29 One baseline ages 51-39. Annually ager 40 yrs+     Pap tests All women Annually if high risk. Every 2 yrs for normal risk women     Screening for cervical cancer with   Pap (Z01.419 nl or Z01.411abnl) &  HPV Z11.51 Women aged 41 to 50 Once every 5 yrs     Screening pelvic and breast exams All women Annually if high risk. Every 2 yrs for normal risk women     Sexually Transmitted Diseases  Chlamydia  Gonorrhea  Syphilis All at risk adults Annually for non pregnant females at increased risk         [place] Men Who How Ofter Need  Date of Last Service Action  Prostate Cancer - DRE & PSA Men over 50 Annually.  DRE might require a copay. X    Sexually Transmitted Diseases  Syphilis All at risk adults Annually for men at increased risk

## 2021-01-12 ENCOUNTER — Other Ambulatory Visit: Payer: Self-pay | Admitting: Internal Medicine

## 2021-01-12 DIAGNOSIS — J301 Allergic rhinitis due to pollen: Secondary | ICD-10-CM

## 2021-02-04 ENCOUNTER — Other Ambulatory Visit: Payer: Self-pay | Admitting: Internal Medicine

## 2021-02-04 DIAGNOSIS — J3089 Other allergic rhinitis: Secondary | ICD-10-CM

## 2021-03-01 ENCOUNTER — Other Ambulatory Visit: Payer: Self-pay

## 2021-03-01 DIAGNOSIS — I10 Essential (primary) hypertension: Secondary | ICD-10-CM

## 2021-03-01 MED ORDER — TRIAMTERENE-HCTZ 37.5-25 MG PO CAPS: ORAL_CAPSULE | ORAL | 0 refills | Status: DC

## 2021-03-21 ENCOUNTER — Other Ambulatory Visit: Payer: Self-pay

## 2021-03-21 ENCOUNTER — Ambulatory Visit (INDEPENDENT_AMBULATORY_CARE_PROVIDER_SITE_OTHER): Payer: Medicare Other | Admitting: Internal Medicine

## 2021-03-21 ENCOUNTER — Encounter: Payer: Self-pay | Admitting: Internal Medicine

## 2021-03-21 VITALS — BP 118/79 | HR 70 | Temp 98.1°F | Ht 68.0 in | Wt 191.9 lb

## 2021-03-21 DIAGNOSIS — K644 Residual hemorrhoidal skin tags: Secondary | ICD-10-CM | POA: Diagnosis not present

## 2021-03-21 DIAGNOSIS — J302 Other seasonal allergic rhinitis: Secondary | ICD-10-CM | POA: Insufficient documentation

## 2021-03-21 DIAGNOSIS — K219 Gastro-esophageal reflux disease without esophagitis: Secondary | ICD-10-CM | POA: Diagnosis not present

## 2021-03-21 DIAGNOSIS — J3089 Other allergic rhinitis: Secondary | ICD-10-CM

## 2021-03-21 DIAGNOSIS — D500 Iron deficiency anemia secondary to blood loss (chronic): Secondary | ICD-10-CM

## 2021-03-21 MED ORDER — SENNOSIDES-DOCUSATE SODIUM 8.6-50 MG PO TABS: 1.0000 | ORAL_TABLET | Freq: Every day | ORAL | 3 refills | Status: DC

## 2021-03-21 MED ORDER — LORATADINE 10 MG PO TABS: 10.0000 mg | ORAL_TABLET | Freq: Every day | ORAL | 1 refills | Status: DC

## 2021-03-21 MED ORDER — OMEPRAZOLE 20 MG PO CPDR: 20.0000 mg | DELAYED_RELEASE_CAPSULE | Freq: Every day | ORAL | 3 refills | Status: DC

## 2021-03-21 MED ORDER — GUAIFENESIN 100 MG/5ML PO SOLN: 5.0000 mL | ORAL | 0 refills | Status: DC | PRN

## 2021-03-21 NOTE — Progress Notes (Signed)
   CC: Sinus congestion  HPI: [name] is a 65 y.o. with PMH listed below presenting with complaint of sinus congestion. Please see problem based assessment and plan for further details.  Past Medical History:  Diagnosis Date  . Chronic constipation   . Deaf-mutism   . Dental caries   . History of iron deficiency anemia   . Hypertriglyceridemia   . Intertrigo   . MR (mental retardation)   . Schizophrenia, paranoid, chronic ([place])   . Umbilical hernia    Review of Systems: Review of Systems  Constitutional: Negative for chills, fever and malaise/fatigue.  Eyes: Negative for blurred vision.  Respiratory: Negative for cough and shortness of breath.   Cardiovascular: Negative for chest pain, palpitations and leg swelling.  Gastrointestinal: Positive for blood in stool and constipation. Negative for diarrhea, nausea and vomiting.  Neurological: Negative for dizziness.  All other systems reviewed and are negative.   Physical Exam: Vitals:   03/21/21 1335  BP: 118/79  Pulse: 70  Temp: 98.1 F (36.7 C)  TempSrc: Oral  SpO2: 100%  Weight: 191 lb 14.4 oz (87 kg)  Height: 5\' 8"  (1.727 m)   Gen: Well-developed, well nourished, NAD HEENT: NCAT head, hearing intact, peri-orbital edema, swollen nasal turbinates CV: RRR, S1, S2 normal Pulm: CTAB, No rales, no wheezes Extm: ROM intact, Peripheral pulses intact, No peripheral edema Skin: Dry, Warm, normal turgor, no wounds, no rashes, no lesions  Assessment & Plan:   Allergic rhinitis Leonard Tucker is a 65 yo M w/ PMH of intellectual disability, deaf, mutism, external hemorrhoids and iron deficiency anemia presenting with complaint of cough, eye swelling and nasal congestion. History obtained with assistance from his mother. She mentions that this is a chronic issue that often reoccurs during this time of the year. He denies any fevers, chills nausea, vomiting, purulent sputum or dyspnea. He mentions itchy eyes with  drainage.  A/P Presenting w/ complaints of allergy sxs. Physical exam consistent with allergic rhinitis. Medication bag reviewed, unable to find previously prescribed Claritin. - Restart Claritin - C/w daily fluticasone - Request name of cough syrup  Iron deficiency anemia due to chronic blood loss Lab Results  Component Value Date   IRON 28 (L) 03/21/2021   TIBC 381 03/21/2021   FERRITIN 34 03/21/2021   Lab Results  Component Value Date   WBC 6.4 03/21/2021   HGB 12.2 (L) 03/21/2021   HCT 40.1 03/21/2021   MCV 72 (L) 03/21/2021   PLT 312 03/21/2021   Presents w/ hx of iron deficiency anemia due to recurrent hemorrhoidal bleed. Have been having occasional BRB in bowel although denies any significant pain. Denies any other bleeding events. Started on iron supplement 6 months prior.  A/P - Iron panel, cbc - C/w iron polysaccharide  Addendum: Hgb improving, not completely replete, continue w/ oral supplement  External hemorrhoids Mentions continuing to have intermittent bleeding. Denies significant abdominal pain and increase in volume of blood.  A/P Stable, discussed need to increase fiber intake, c/w hemorrhoidal cream   Patient discussed with Dr. Jimmye Norman  -Gilberto Better, [facility] Pager: [phone]

## 2021-03-21 NOTE — Patient Instructions (Signed)
Dear Leonard Tucker,  Thank you for allowing [place] to provide your care today. Today we discussed your constipation    I have ordered cbc, iron panel labs for you. I will call if any are abnormal.    Today we made the following changes to your medications:    Please add on senokot to your daily miralax  Please follow-up in 3 months.    Please call the internal medicine center clinic if you have any questions or concerns, we may be able to help and keep you from a long and expensive emergency room wait. Our clinic and after hours phone number is [phone], the best time to call is Monday through Friday 9 am to 4 pm but there is always someone available 24/7 if you have an emergency. If you need medication refills please notify your pharmacy one week in advance and they will send [place] a request.    If you have not gotten the COVID vaccine, I recommend doing so:  You may get it at your local [facility] or [facility] OR To schedule an appointment for a COVID vaccine or be added to the vaccine wait list: Go to [email or web address]   OR Go to [email or web address]                  OR Call [phone]                                     OR Call [phone] and select Option 2  Thank you for choosing [facility]   Hemorrhoids Hemorrhoids are swollen veins that may develop:  In the butt (rectum). These are called internal hemorrhoids.  Around the opening of the butt (anus). These are called external hemorrhoids. Hemorrhoids can cause pain, itching, or bleeding. Most of the time, they do not cause serious problems. They usually get better with diet changes, lifestyle changes, and other home treatments. What are the causes? This condition may be caused by:  Having trouble pooping (constipation).  Pushing hard (straining) to poop.  Watery poop (diarrhea).  Pregnancy.  Being very overweight (obese).  Sitting for long periods of time.  Heavy lifting or other activity that  causes you to strain.  Anal sex.  Riding a bike for a long period of time. What are the signs or symptoms? Symptoms of this condition include:  Pain.  Itching or soreness in the butt.  Bleeding from the butt.  Leaking poop.  Swelling in the area.  One or more lumps around the opening of your butt. How is this diagnosed? A doctor can often diagnose this condition by looking at the affected area. The doctor may also:  Do an exam that involves feeling the area with a gloved hand (digital rectal exam).  Examine the area inside your butt using a small tube (anoscope).  Order blood tests. This may be done if you have lost a lot of blood.  Have you get a test that involves looking inside the colon using a flexible tube with a camera on the end (sigmoidoscopy or colonoscopy). How is this treated? This condition can usually be treated at home. Your doctor may tell you to change what you eat, make lifestyle changes, or try home treatments. If these do not help, procedures can be done to remove the hemorrhoids or make them smaller. These may involve:  Placing rubber bands at the base of the hemorrhoids to  cut off their blood supply.  Injecting medicine into the hemorrhoids to shrink them.  Shining a type of light energy onto the hemorrhoids to cause them to fall off.  Doing surgery to remove the hemorrhoids or cut off their blood supply. Follow these instructions at home: Eating and drinking  Eat foods that have a lot of fiber in them. These include whole grains, beans, nuts, fruits, and vegetables.  Ask your doctor about taking products that have added fiber (fibersupplements).  Reduce the amount of fat in your diet. You can do this by: ? Eating low-fat dairy products. ? Eating less red meat. ? Avoiding processed foods.  Drink enough fluid to keep your pee (urine) pale yellow.   Managing pain and swelling  Take a warm-water bath (sitz bath) for 20 minutes to ease pain. Do  this 3-4 times a day. You may do this in a bathtub or using a portable sitz bath that fits over the toilet.  If told, put ice on the painful area. It may be helpful to use ice between your warm baths. ? Put ice in a plastic bag. ? Place a towel between your skin and the bag. ? Leave the ice on for 20 minutes, 2-3 times a day.   General instructions  Take over-the-counter and prescription medicines only as told by your doctor. ? Medicated creams and medicines may be used as told.  Exercise often. Ask your doctor how much and what kind of exercise is best for you.  Go to the bathroom when you have the urge to poop. Do not wait.  Avoid pushing too hard when you poop.  Keep your butt dry and clean. Use wet toilet paper or moist towelettes after pooping.  Do not sit on the toilet for a long time.  Keep all follow-up visits as told by your doctor. This is important. Contact a doctor if you:  Have pain and swelling that do not get better with treatment or medicine.  Have trouble pooping.  Cannot poop.  Have pain or swelling outside the area of the hemorrhoids. Get help right away if you have:  Bleeding that will not stop. Summary  Hemorrhoids are swollen veins in the butt or around the opening of the butt.  They can cause pain, itching, or bleeding.  Eat foods that have a lot of fiber in them. These include whole grains, beans, nuts, fruits, and vegetables.  Take a warm-water bath (sitz bath) for 20 minutes to ease pain. Do this 3-4 times a day. This information is not intended to replace advice given to you by your health care provider. Make sure you discuss any questions you have with your health care provider. Document Revised: 11/26/2018 Document Reviewed: 04/09/2018 Elsevier Patient Education  [place].

## 2021-03-22 LAB — CBC
Hematocrit: 40.1 % (ref 37.5–51.0)
Hemoglobin: 12.2 g/dL — ABNORMAL LOW (ref 13.0–17.7)
MCH: 21.8 pg — ABNORMAL LOW (ref 26.6–33.0)
MCHC: 30.4 g/dL — ABNORMAL LOW (ref 31.5–35.7)
MCV: 72 fL — ABNORMAL LOW (ref 79–97)
Platelets: 312 x10E3/uL (ref 150–450)
RBC: 5.59 x10E6/uL (ref 4.14–5.80)
RDW: 16 % — ABNORMAL HIGH (ref 11.6–15.4)
WBC: 6.4 x10E3/uL (ref 3.4–10.8)

## 2021-03-22 LAB — FERRITIN: Ferritin: 34 ng/mL (ref 30–400)

## 2021-03-22 LAB — IRON AND TIBC
Iron Saturation: 7 % — CL (ref 15–55)
Iron: 28 ug/dL — ABNORMAL LOW (ref 38–169)
Total Iron Binding Capacity: 381 ug/dL (ref 250–450)
UIBC: 353 ug/dL — ABNORMAL HIGH (ref 111–343)

## 2021-03-23 ENCOUNTER — Encounter: Payer: Self-pay | Admitting: Internal Medicine

## 2021-03-23 NOTE — Assessment & Plan Note (Signed)
Pt requires refills on medications with associated diagnosis above.  Reviewed disease process and find this medication to be necessary, will not change dose or alter current therapy. 

## 2021-03-23 NOTE — Assessment & Plan Note (Signed)
Mentions continuing to have intermittent bleeding. Denies significant abdominal pain and increase in volume of blood.  A/P Stable, discussed need to increase fiber intake, c/w hemorrhoidal cream

## 2021-03-23 NOTE — Assessment & Plan Note (Signed)
Leonard Tucker is a 65 yo M w/ PMH of intellectual disability, deaf, mutism, external hemorrhoids and iron deficiency anemia presenting with complaint of cough, eye swelling and nasal congestion. History obtained with assistance from his mother. She mentions that this is a chronic issue that often reoccurs during this time of the year. He denies any fevers, chills nausea, vomiting, purulent sputum or dyspnea. He mentions itchy eyes with drainage.  A/P Presenting w/ complaints of allergy sxs. Physical exam consistent with allergic rhinitis. Medication bag reviewed, unable to find previously prescribed Claritin. - Restart Claritin - C/w daily fluticasone - Request name of cough syrup

## 2021-03-23 NOTE — Progress Notes (Signed)
Internal Medicine Clinic Attending  Case discussed with Dr. Lee  At the time of the visit.  We reviewed the resident's history and exam and pertinent patient test results.  I agree with the assessment, diagnosis, and plan of care documented in the resident's note.    

## 2021-03-23 NOTE — Assessment & Plan Note (Signed)
Lab Results  Component Value Date   IRON 28 (L) 03/21/2021   TIBC 381 03/21/2021   FERRITIN 34 03/21/2021   Lab Results  Component Value Date   WBC 6.4 03/21/2021   HGB 12.2 (L) 03/21/2021   HCT 40.1 03/21/2021   MCV 72 (L) 03/21/2021   PLT 312 03/21/2021   Presents w/ hx of iron deficiency anemia due to recurrent hemorrhoidal bleed. Have been having occasional BRB in bowel although denies any significant pain. Denies any other bleeding events. Started on iron supplement 6 months prior.  A/P - Iron panel, cbc - C/w iron polysaccharide  Addendum: Hgb improving, not completely replete, continue w/ oral supplement

## 2021-03-26 ENCOUNTER — Other Ambulatory Visit: Payer: Self-pay | Admitting: Internal Medicine

## 2021-03-26 DIAGNOSIS — D500 Iron deficiency anemia secondary to blood loss (chronic): Secondary | ICD-10-CM

## 2021-04-19 ENCOUNTER — Other Ambulatory Visit: Payer: Self-pay | Admitting: Internal Medicine

## 2021-04-19 DIAGNOSIS — J301 Allergic rhinitis due to pollen: Secondary | ICD-10-CM

## 2021-05-03 ENCOUNTER — Other Ambulatory Visit: Payer: Self-pay | Admitting: Student

## 2021-05-27 ENCOUNTER — Other Ambulatory Visit: Payer: Self-pay | Admitting: Student

## 2021-05-27 DIAGNOSIS — I10 Essential (primary) hypertension: Secondary | ICD-10-CM

## 2021-06-17 ENCOUNTER — Other Ambulatory Visit: Payer: Self-pay | Admitting: Internal Medicine

## 2021-06-17 DIAGNOSIS — K219 Gastro-esophageal reflux disease without esophagitis: Secondary | ICD-10-CM

## 2021-07-22 ENCOUNTER — Other Ambulatory Visit: Payer: Self-pay | Admitting: Internal Medicine

## 2021-07-22 DIAGNOSIS — J301 Allergic rhinitis due to pollen: Secondary | ICD-10-CM

## 2021-08-23 ENCOUNTER — Telehealth: Payer: Self-pay | Admitting: *Deleted

## 2021-08-23 NOTE — Telephone Encounter (Signed)
Patient's mother called in. States patient has frequent constipation 2/2 iron supplementation. Patient strains to have a BM and for the last week, he has had blood in the toilet she thinks is coming from known hemorrhoids. States she will take him to Urgent Care tomorrow. Discussed making sure he drinks enough fluids, especially water, has enough fiber in his diet, and physical activity. States she gives him a powder and stool softener, he drinks "plenty of water," but does not like high fiber foods and "sits around all day." She will encourage him to walk in the house more as it's too hot outside. F/u appt made for 9/28 at 0845 to discuss what else he can do for this.

## 2021-08-25 ENCOUNTER — Other Ambulatory Visit: Payer: Self-pay | Admitting: Student

## 2021-08-25 DIAGNOSIS — I10 Essential (primary) hypertension: Secondary | ICD-10-CM

## 2021-08-27 NOTE — Telephone Encounter (Signed)
Next appt scheduled 08/29/21 with Dr Lisabeth Devoid.

## 2021-08-28 ENCOUNTER — Other Ambulatory Visit: Payer: Self-pay | Admitting: Internal Medicine

## 2021-08-28 DIAGNOSIS — K5900 Constipation, unspecified: Secondary | ICD-10-CM

## 2021-08-28 NOTE — Telephone Encounter (Signed)
Next appt scheduled tomorrow with Dr Lisabeth Devoid.

## 2021-08-29 ENCOUNTER — Encounter: Payer: Self-pay | Admitting: Student

## 2021-08-29 ENCOUNTER — Ambulatory Visit (INDEPENDENT_AMBULATORY_CARE_PROVIDER_SITE_OTHER): Payer: Medicare Other | Admitting: Student

## 2021-08-29 ENCOUNTER — Other Ambulatory Visit: Payer: Self-pay

## 2021-08-29 VITALS — BP 107/77 | HR 76 | Temp 98.2°F | Resp 28 | Ht 68.0 in | Wt 196.6 lb

## 2021-08-29 DIAGNOSIS — Z23 Encounter for immunization: Secondary | ICD-10-CM | POA: Diagnosis not present

## 2021-08-29 DIAGNOSIS — I10 Essential (primary) hypertension: Secondary | ICD-10-CM | POA: Diagnosis not present

## 2021-08-29 DIAGNOSIS — K644 Residual hemorrhoidal skin tags: Secondary | ICD-10-CM | POA: Diagnosis not present

## 2021-08-29 DIAGNOSIS — D500 Iron deficiency anemia secondary to blood loss (chronic): Secondary | ICD-10-CM

## 2021-08-29 DIAGNOSIS — Z0000 Encounter for general adult medical examination without abnormal findings: Secondary | ICD-10-CM

## 2021-08-29 DIAGNOSIS — E785 Hyperlipidemia, unspecified: Secondary | ICD-10-CM

## 2021-08-29 DIAGNOSIS — K5900 Constipation, unspecified: Secondary | ICD-10-CM | POA: Diagnosis not present

## 2021-08-29 MED ORDER — POLYETHYLENE GLYCOL 3350 17 GM/SCOOP PO POWD: ORAL | 1 refills | Status: DC

## 2021-08-29 MED ORDER — SORBITOL 70 % PO SOLN: 15.0000 mL | Freq: Every day | ORAL | 0 refills | Status: DC | PRN

## 2021-08-29 NOTE — Patient Instructions (Addendum)
It was a pleasure seeing you in clinic. Today we discussed:   Constipation  Please take iron supplements every other day Please try the laxative powder daily to help with the stools You can also use sorbitol and needed in he is having difficulty with the laxative  Continue your other medications as usual  We administered a flu shot and a tetanus shot today  We will also check some blood work and I will call with results   If you have any questions or concerns, please call our clinic at [phone] between 9am-5pm and after hours call [phone] and ask for the internal medicine resident on call. If you feel you are having a medical emergency please call 911.   Thank you, we look forward to helping you remain healthy!

## 2021-08-30 LAB — CBC
Hematocrit: 39.1 % (ref 37.5–51.0)
Hemoglobin: 12 g/dL — ABNORMAL LOW (ref 13.0–17.7)
MCH: 22.3 pg — ABNORMAL LOW (ref 26.6–33.0)
MCHC: 30.7 g/dL — ABNORMAL LOW (ref 31.5–35.7)
MCV: 73 fL — ABNORMAL LOW (ref 79–97)
Platelets: 265 10*3/uL (ref 150–450)
RBC: 5.37 x10E6/uL (ref 4.14–5.80)
RDW: 16.5 % — ABNORMAL HIGH (ref 11.6–15.4)
WBC: 6.4 10*3/uL (ref 3.4–10.8)

## 2021-08-30 LAB — BMP8+ANION GAP
Anion Gap: 15 mmol/L (ref 10.0–18.0)
BUN/Creatinine Ratio: 9 — ABNORMAL LOW (ref 10–24)
BUN: 9 mg/dL (ref 8–27)
CO2: 25 mmol/L (ref 20–29)
Calcium: 9.6 mg/dL (ref 8.6–10.2)
Chloride: 101 mmol/L (ref 96–106)
Creatinine, Ser: 0.98 mg/dL (ref 0.76–1.27)
Glucose: 94 mg/dL (ref 70–99)
Potassium: 4 mmol/L (ref 3.5–5.2)
Sodium: 141 mmol/L (ref 134–144)
eGFR: 87 mL/min/{1.73_m2} (ref >59)

## 2021-08-30 LAB — LIPID PANEL
Chol/HDL Ratio: 4.1 ratio (ref 0.0–5.0)
Cholesterol, Total: 167 mg/dL (ref 100–199)
HDL: 41 mg/dL (ref >39)
LDL Chol Calc (NIH): 77 mg/dL (ref 0–99)
Triglycerides: 300 mg/dL — ABNORMAL HIGH (ref 0–149)
VLDL Cholesterol Cal: 49 mg/dL — ABNORMAL HIGH (ref 5–40)

## 2021-08-30 LAB — IRON,TIBC AND FERRITIN PANEL
Ferritin: 16 ng/mL — ABNORMAL LOW (ref 30–400)
Iron Saturation: 10 % — ABNORMAL LOW (ref 15–55)
Iron: 39 ug/dL (ref 38–169)
Total Iron Binding Capacity: 374 ug/dL (ref 250–450)
UIBC: 335 ug/dL (ref 111–343)

## 2021-08-31 NOTE — Assessment & Plan Note (Signed)
Flu and Td administered today.

## 2021-08-31 NOTE — Assessment & Plan Note (Signed)
Lipid panel today

## 2021-08-31 NOTE — Progress Notes (Signed)
   CC: Constipation  HPI:  [name] is a 65 y.o. male with history below presents for follow-up of constipation and hemorrhoidal bleeding.Please refer to problem based charting for further details and assessment and plan of current problem and chronic medical conditions.   Past Medical History:  Diagnosis Date   Chronic constipation    Deaf-mutism    Dental caries    History of iron deficiency anemia    Hypertriglyceridemia    Intertrigo    MR (mental retardation)    Schizophrenia, paranoid, chronic (HCC)    Umbilical hernia    Review of Systems:  Negative as per HPI  Physical Exam:  Vitals:   08/29/21 0905  BP: 107/77  Pulse: 76  Resp: (!) 28  Temp: 98.2 F (36.8 C)  TempSrc: Oral  SpO2: 100%  Weight: 196 lb 9.6 oz (89.2 kg)  Height: 5\' 8"  (1.727 m)   Physical Exam Constitutional:      Appearance: Normal appearance.  HENT:     Head: Normocephalic and atraumatic.     Mouth/Throat:     Mouth: Mucous membranes are moist.     Pharynx: Oropharynx is clear.  Eyes:     Pupils: Pupils are equal, round, and reactive to light.     Comments: No conjunctiva pallor  Cardiovascular:     Rate and Rhythm: Normal rate and regular rhythm.  Pulmonary:     Effort: Pulmonary effort is normal.     Breath sounds: Normal breath sounds. No rhonchi or rales.  Abdominal:     General: Abdomen is flat. Bowel sounds are normal. There is no distension.     Palpations: Abdomen is soft.     Tenderness: There is no abdominal tenderness.  Musculoskeletal:     Cervical back: Normal range of motion and neck supple.  Skin:    General: Skin is warm and dry.     Capillary Refill: Capillary refill takes 2 to 3 seconds.  Neurological:     Mental Status: He is alert. Mental status is at baseline.  Psychiatric:        Mood and Affect: Mood normal.        Behavior: Behavior normal.     Assessment & Plan:   See Encounters Tab for problem based charting.  Patient discussed with Dr.  Heber [place]

## 2021-08-31 NOTE — Assessment & Plan Note (Addendum)
Patient with a history of iron deficiency anemia secondary to hemorrhoidal bleeds.  Mother reports increased bleeding and straining with bowel movements last week which was somewhat improved this week.  Patient has been taking his iron supplements every other day.  Leukoma stable since last visit in April.  Iron studies with low ferritin, low iron saturation, normal total iron binding capacity.   Continue iron polysaccharides 150 mg every other day.

## 2021-08-31 NOTE — Assessment & Plan Note (Signed)
Patient presents with mother for follow-up of constipation.  Mother called clinic last week for concerns of blood with bowel movements.  Notes patient often strains when having a bowel movement and has streaks of blood on the stool as well as blood in the toilet bowl.  She has been giving him MiraLAX and senna intermittently as needed for constipation.  Notes bleeding has improved and patient is now having soft stools every day or every other day.  He does occasionally have a small amount of blood with bowel movements but this is much improved since last week.  Patient currently denies any abdominal pain, constipation, rectal pain.  Discussed having patient avoid straining and regularly taking his MiraLAX and senna to help prevent further constipation and bleeding.  Mother states patient is generally agreeable to taking MiraLAX.  Although he sometimes will not drink it entirely.  Offered to send prescription for sorbitol as the sweet taste may be more palatable to the patient.  Patient and mother are agreeable to this.  MiraLAX 17 g daily Senna 2 tablets twice daily Sorbitol 70% 50 mL daily as needed, if patient is unwilling to take MiraLAX.

## 2021-08-31 NOTE — Assessment & Plan Note (Addendum)
Vitals:   08/29/21 0905  BP: 107/77   Blood pressure well controlled with current medical regimen. BMP with stable kidney function.  No electrolyte abnormalities. continue current management.

## 2021-09-06 NOTE — Progress Notes (Signed)
Internal Medicine Clinic Attending ? ?Case discussed with Dr. Liang  At the time of the visit.  We reviewed the resident?s history and exam and pertinent patient test results.  I agree with the assessment, diagnosis, and plan of care documented in the resident?s note. ? ?

## 2021-10-04 ENCOUNTER — Other Ambulatory Visit: Payer: Self-pay | Admitting: Internal Medicine

## 2021-10-04 DIAGNOSIS — I10 Essential (primary) hypertension: Secondary | ICD-10-CM

## 2021-10-23 ENCOUNTER — Other Ambulatory Visit: Payer: Self-pay | Admitting: Student

## 2021-10-23 DIAGNOSIS — J301 Allergic rhinitis due to pollen: Secondary | ICD-10-CM

## 2021-11-14 ENCOUNTER — Other Ambulatory Visit: Payer: Self-pay | Admitting: Student

## 2021-11-14 DIAGNOSIS — I10 Essential (primary) hypertension: Secondary | ICD-10-CM

## 2021-12-13 ENCOUNTER — Encounter: Payer: Medicare Other | Admitting: Student

## 2021-12-20 ENCOUNTER — Ambulatory Visit (INDEPENDENT_AMBULATORY_CARE_PROVIDER_SITE_OTHER): Payer: Medicare Other | Admitting: Student

## 2021-12-20 ENCOUNTER — Other Ambulatory Visit: Payer: Self-pay

## 2021-12-20 VITALS — BP 117/76 | HR 84 | Temp 97.9°F | Ht 68.0 in | Wt 192.8 lb

## 2021-12-20 DIAGNOSIS — K649 Unspecified hemorrhoids: Secondary | ICD-10-CM

## 2021-12-20 DIAGNOSIS — D500 Iron deficiency anemia secondary to blood loss (chronic): Secondary | ICD-10-CM

## 2021-12-20 DIAGNOSIS — K5909 Other constipation: Secondary | ICD-10-CM | POA: Diagnosis not present

## 2021-12-20 DIAGNOSIS — I10 Essential (primary) hypertension: Secondary | ICD-10-CM | POA: Diagnosis not present

## 2021-12-20 DIAGNOSIS — J3089 Other allergic rhinitis: Secondary | ICD-10-CM

## 2021-12-20 DIAGNOSIS — K219 Gastro-esophageal reflux disease without esophagitis: Secondary | ICD-10-CM

## 2021-12-20 DIAGNOSIS — K5900 Constipation, unspecified: Secondary | ICD-10-CM

## 2021-12-20 MED ORDER — POLYSACCHARIDE IRON COMPLEX 150 MG PO CAPS: 150.0000 mg | ORAL_CAPSULE | Freq: Every day | ORAL | 1 refills | Status: DC

## 2021-12-20 NOTE — Patient Instructions (Signed)
It was a pleasure seeing you in clinic. Today we discussed:   I am glad the constipation is getting better, we will check your iron levels and I will call you if there are any other issues  If you have any questions or concerns, please call our clinic at [phone] between 9am-5pm and after hours call [phone] and ask for the internal medicine resident on call. If you feel you are having a medical emergency please call 911.   Thank you, we look forward to helping you remain healthy!

## 2021-12-21 NOTE — Progress Notes (Signed)
° °  CC: follow up of constipation and iron deficiency anemia  HPI:  Leonard Tucker is a 66 y.o. male with past medical history below presents for follow up of iron deficiency anemia secondary to hemorrhoidal bleeding from chronic constipation. Please refer to problem based charting for further details and assessment and plan of current problem and chronic medical conditions.   Past Medical History:  Diagnosis Date   Chronic constipation    Deaf-mutism    Dental caries    History of iron deficiency anemia    Hypertriglyceridemia    Intertrigo    MR (mental retardation)    Schizophrenia, paranoid, chronic (HCC)    Umbilical hernia    Review of Systems:  negative as per HPI  Physical Exam:  Vitals:   12/20/21 1020  BP: 117/76  Pulse: 84  Temp: 97.9 F (36.6 C)  TempSrc: Oral  SpO2: 100%  Weight: 192 lb 12.8 oz (87.5 kg)  Height: 5\' 8"  (1.727 m)   Physical Exam Constitutional:      General: He is not in acute distress.    Appearance: Normal appearance. He is obese. He is not ill-appearing.  HENT:     Head: Normocephalic and atraumatic.     Ears:     Comments: Deaf mutism    Mouth/Throat:     Mouth: Mucous membranes are moist.     Pharynx: Oropharynx is clear.  Eyes:     Pupils: Pupils are equal, round, and reactive to light.  Cardiovascular:     Rate and Rhythm: Normal rate and regular rhythm.     Pulses: Normal pulses.     Heart sounds: Normal heart sounds.  Abdominal:     General: Abdomen is flat. Bowel sounds are normal. There is no distension.     Palpations: Abdomen is soft.     Tenderness: There is no abdominal tenderness.  Musculoskeletal:     Right lower leg: No edema.     Left lower leg: No edema.  Skin:    General: Skin is warm and dry.     Capillary Refill: Capillary refill takes less than 2 seconds.  Neurological:     General: No focal deficit present.     Mental Status: He is alert and oriented to person, place, and time.     Assessment &  Plan:   See Encounters Tab for problem based charting.  Patient discussed with Dr.  Cain Sieve

## 2021-12-22 LAB — IRON,TIBC AND FERRITIN PANEL
Ferritin: 17 ng/mL — ABNORMAL LOW (ref 30–400)
Iron Saturation: 6 % — CL (ref 15–55)
Iron: 22 ug/dL — ABNORMAL LOW (ref 38–169)
Total Iron Binding Capacity: 363 ug/dL (ref 250–450)
UIBC: 341 ug/dL (ref 111–343)

## 2021-12-22 NOTE — Assessment & Plan Note (Signed)
Well controled today will continue on current medications.

## 2021-12-22 NOTE — Assessment & Plan Note (Addendum)
Patient interviewed with mother in the room interpreting. States he is having fewer episodes of bleeding around once every 2 weeks. Per mother does not the iron supplementation does tend to make pateint constipated.  Does not appears this has recently been filled. Patient has a full bottle in office today. Patient has been taking miralax as needed for constipation, which is also not occurring as frequently. Vitals stable, good cap refill on exam. Discussed increasing fiber in diet and using miralax to help with constipation. Discussed taking iron supplements MWF to help prevent constipation. Will check iron studies today, If still low will order IV iron.  Addendum     Component Value Date/Time   IRON 22 (L) 12/20/2021 1139   TIBC 363 12/20/2021 1139   FERRITIN 17 (L) 12/20/2021 1139   IRONPCTSAT 6 (LL) 12/20/2021 1139   Iron studies remain low c/w iron deficiency. Iron deficient base on prior CBC of 918 mg. Will order IV iron transfusion and recheck in 6- 8 weeks.

## 2021-12-24 ENCOUNTER — Telehealth: Payer: Self-pay | Admitting: *Deleted

## 2021-12-24 NOTE — Addendum Note (Signed)
Addended by: [name] on: 12/24/2021 08:53 AM   Modules accepted: Orders

## 2021-12-24 NOTE — Telephone Encounter (Signed)
Leonard Tucker [phone]) to schedule Iron Infusion for Leonard Tucker to be done at [place] patient (spoke with mother) regarding order for Iron Infusion to be done here at [facility] / March 2,2023 @ 10:00 am.  Mother has been instructed to arrive 15 minutes early to the [facility] . I will put this information in the mail to the patient.

## 2021-12-24 NOTE — Progress Notes (Signed)
Internal Medicine Clinic Attending  Case discussed with Dr. Lisabeth Devoid  At the time of the visit.  We reviewed the residents history and exam and pertinent patient test results.  I agree with the assessment, diagnosis, and plan of care documented in the residents note.   Patient's iron is still very low today. He's failed an attempt at oral iron, so will do IV iron next.  We did not discuss Atenolol today, but consider switching to an alternative med at next visit.

## 2021-12-31 ENCOUNTER — Encounter: Payer: Medicare Other | Admitting: Student

## 2022-01-21 ENCOUNTER — Other Ambulatory Visit: Payer: Self-pay

## 2022-01-21 DIAGNOSIS — J3089 Other allergic rhinitis: Secondary | ICD-10-CM

## 2022-01-31 ENCOUNTER — Encounter (HOSPITAL_COMMUNITY): Payer: Commercial Managed Care - HMO

## 2022-03-11 DIAGNOSIS — I10 Essential (primary) hypertension: Secondary | ICD-10-CM | POA: Diagnosis not present

## 2022-03-11 DIAGNOSIS — F17200 Nicotine dependence, unspecified, uncomplicated: Secondary | ICD-10-CM | POA: Diagnosis not present

## 2022-03-11 DIAGNOSIS — R7401 Elevation of levels of liver transaminase levels: Secondary | ICD-10-CM | POA: Diagnosis not present

## 2022-03-11 DIAGNOSIS — R5383 Other fatigue: Secondary | ICD-10-CM | POA: Diagnosis not present

## 2022-03-11 DIAGNOSIS — R5381 Other malaise: Secondary | ICD-10-CM | POA: Diagnosis not present

## 2022-03-11 DIAGNOSIS — M5381 Other specified dorsopathies, occipito-atlanto-axial region: Secondary | ICD-10-CM | POA: Diagnosis not present

## 2022-03-11 DIAGNOSIS — Z20822 Contact with and (suspected) exposure to covid-19: Secondary | ICD-10-CM | POA: Diagnosis not present

## 2022-03-18 ENCOUNTER — Encounter (HOSPITAL_COMMUNITY): Payer: Self-pay | Admitting: *Deleted

## 2022-03-18 ENCOUNTER — Ambulatory Visit (HOSPITAL_COMMUNITY)
Admission: EM | Admit: 2022-03-18 | Discharge: 2022-03-18 | Disposition: A | Discharge status: Home / Self Care | Payer: Medicare Other | Attending: Family Medicine | Admitting: Family Medicine

## 2022-03-18 ENCOUNTER — Telehealth: Payer: Self-pay | Admitting: *Deleted

## 2022-03-18 ENCOUNTER — Inpatient Hospital Stay (HOSPITAL_COMMUNITY)
Admission: EM | Admit: 2022-03-18 | Discharge: 2022-03-24 | DRG: 389 | Disposition: A | Discharge status: Home / Self Care | Payer: Medicare Other | Source: Ambulatory Visit | Attending: Student in an Organized Health Care Education/Training Program | Admitting: Student in an Organized Health Care Education/Training Program

## 2022-03-18 ENCOUNTER — Ambulatory Visit (INDEPENDENT_AMBULATORY_CARE_PROVIDER_SITE_OTHER): Payer: Medicare Other

## 2022-03-18 ENCOUNTER — Other Ambulatory Visit: Payer: Self-pay

## 2022-03-18 DIAGNOSIS — K920 Hematemesis: Secondary | ICD-10-CM | POA: Diagnosis not present

## 2022-03-18 DIAGNOSIS — Z833 Family history of diabetes mellitus: Secondary | ICD-10-CM

## 2022-03-18 DIAGNOSIS — K219 Gastro-esophageal reflux disease without esophagitis: Secondary | ICD-10-CM | POA: Diagnosis not present

## 2022-03-18 DIAGNOSIS — E876 Hypokalemia: Secondary | ICD-10-CM | POA: Diagnosis present

## 2022-03-18 DIAGNOSIS — Z8249 Family history of ischemic heart disease and other diseases of the circulatory system: Secondary | ICD-10-CM | POA: Diagnosis not present

## 2022-03-18 DIAGNOSIS — K56601 Complete intestinal obstruction, unspecified as to cause: Secondary | ICD-10-CM | POA: Diagnosis not present

## 2022-03-18 DIAGNOSIS — N179 Acute kidney failure, unspecified: Secondary | ICD-10-CM | POA: Diagnosis present

## 2022-03-18 DIAGNOSIS — D500 Iron deficiency anemia secondary to blood loss (chronic): Secondary | ICD-10-CM | POA: Diagnosis present

## 2022-03-18 DIAGNOSIS — Z79899 Other long term (current) drug therapy: Secondary | ICD-10-CM | POA: Diagnosis not present

## 2022-03-18 DIAGNOSIS — E119 Type 2 diabetes mellitus without complications: Secondary | ICD-10-CM | POA: Diagnosis not present

## 2022-03-18 DIAGNOSIS — R1110 Vomiting, unspecified: Secondary | ICD-10-CM

## 2022-03-18 DIAGNOSIS — R6250 Unspecified lack of expected normal physiological development in childhood: Secondary | ICD-10-CM | POA: Diagnosis not present

## 2022-03-18 DIAGNOSIS — H913 Deaf nonspeaking, not elsewhere classified: Secondary | ICD-10-CM | POA: Diagnosis present

## 2022-03-18 DIAGNOSIS — E781 Pure hyperglyceridemia: Secondary | ICD-10-CM | POA: Diagnosis present

## 2022-03-18 DIAGNOSIS — F200 Paranoid schizophrenia: Secondary | ICD-10-CM | POA: Diagnosis present

## 2022-03-18 DIAGNOSIS — Z743 Need for continuous supervision: Secondary | ICD-10-CM | POA: Diagnosis not present

## 2022-03-18 DIAGNOSIS — K6389 Other specified diseases of intestine: Secondary | ICD-10-CM | POA: Diagnosis not present

## 2022-03-18 DIAGNOSIS — I10 Essential (primary) hypertension: Secondary | ICD-10-CM | POA: Diagnosis present

## 2022-03-18 DIAGNOSIS — K5909 Other constipation: Secondary | ICD-10-CM | POA: Diagnosis present

## 2022-03-18 DIAGNOSIS — K429 Umbilical hernia without obstruction or gangrene: Secondary | ICD-10-CM | POA: Diagnosis not present

## 2022-03-18 DIAGNOSIS — K560 Paralytic ileus: Secondary | ICD-10-CM | POA: Diagnosis not present

## 2022-03-18 DIAGNOSIS — K5900 Constipation, unspecified: Secondary | ICD-10-CM | POA: Diagnosis not present

## 2022-03-18 DIAGNOSIS — R140 Abdominal distension (gaseous): Secondary | ICD-10-CM | POA: Diagnosis not present

## 2022-03-18 DIAGNOSIS — N281 Cyst of kidney, acquired: Secondary | ICD-10-CM | POA: Diagnosis not present

## 2022-03-18 DIAGNOSIS — K3189 Other diseases of stomach and duodenum: Secondary | ICD-10-CM | POA: Diagnosis not present

## 2022-03-18 DIAGNOSIS — K567 Ileus, unspecified: Secondary | ICD-10-CM

## 2022-03-18 DIAGNOSIS — R1111 Vomiting without nausea: Secondary | ICD-10-CM | POA: Diagnosis not present

## 2022-03-18 DIAGNOSIS — K56609 Unspecified intestinal obstruction, unspecified as to partial versus complete obstruction: Secondary | ICD-10-CM | POA: Diagnosis present

## 2022-03-18 DIAGNOSIS — H9193 Unspecified hearing loss, bilateral: Secondary | ICD-10-CM

## 2022-03-18 DIAGNOSIS — F79 Unspecified intellectual disabilities: Secondary | ICD-10-CM | POA: Diagnosis present

## 2022-03-18 NOTE — ED Triage Notes (Signed)
Pt has been vomiting today. Pt was feeling good yesterday . ?

## 2022-03-18 NOTE — ED Triage Notes (Signed)
Pt bib [facility] from home c/o abdominal pain. Pt was seen at [facility] earlier today for vomiting. X-rays showed SBO. Pt did have BM this morning but was hard and not a lot. No vomiting with EMS. Pt abd is distended and tender. Pt is deaf and mute and does not sign.  ? ?EMS Vitals ?100 HR ?97% RA ?116/72 BP ?

## 2022-03-18 NOTE — Telephone Encounter (Signed)
Call from pt's sister - stated pt has been throwing up all morning; unable to keep anything down. Currently in route to the hospital - she's unsure whether to bring him here or not  - I informed her, we do not have any open appts this afternoon and to take pt to UC - she state she will. ?

## 2022-03-18 NOTE — ED Provider Notes (Signed)
?[facility] ? ? ? ?CSN: [id number] ?Arrival date & time: 03/18/22  1519 ? ? ?  ? ?History   ?Chief Complaint ?Chief Complaint  ?Patient presents with  ? Emesis  ? ? ?HPI ?Leonard Tucker is a 66 y.o. male.  ? ? ?Emesis ?Here for vomiting that began this AM. Has thrown up 3 times today. No f/c. Has had some abd pain when is throwing up. History obtained from sister and mom (pt is deaf). ? ?Also, mother noted hard stool today in toilet this AM, with a spot of blood. They state his abd is protuberant usually.  ? ?Also had had cough/congestion for about 1-2 weeks, and they gave him chlorpheniramine/dextromethorphan for cough this AM, and are concerned that med caused the vomiting. ? ?Past Medical History:  ?Diagnosis Date  ? Chronic constipation   ? Deaf-mutism   ? Dental caries   ? History of iron deficiency anemia   ? Hypertriglyceridemia   ? Intertrigo   ? MR (mental retardation)   ? Schizophrenia, paranoid, chronic ([place])   ? Umbilical hernia   ? ? ?Patient Active Problem List  ? Diagnosis Date Noted  ? Seasonal allergies 03/21/2021  ? External hemorrhoids 09/22/2020  ? Diarrhea 08/19/2018  ? Need for immunization against influenza 08/19/2018  ? Healthcare maintenance 02/10/2013  ? Iron deficiency anemia due to chronic blood loss 11/01/2011  ? Fungal infection of toenail 08/21/2011  ? GERD (gastroesophageal reflux disease) 06/28/2011  ? HYPOKALEMIA 03/27/2010  ? Allergic rhinitis 10/18/2009  ? Constipation 05/19/2007  ? Dyslipidemia 11/18/2006  ? PARANOID SCHIZOPHRENIA, CHRONIC 10/13/2006  ? MENTAL RETARDATION 10/13/2006  ? DEAF MUTISM 10/13/2006  ? Essential hypertension 10/13/2006  ? Dental caries 10/13/2006  ? ? ?History reviewed. No pertinent surgical history. ? ? ? ? ?Home Medications   ? ?Prior to Admission medications   ?Medication Sig Start Date End Date Taking? Authorizing Provider  ?atenolol (TENORMIN) 25 MG tablet TAKE 1 TABLET BY MOUTH EVERY DAY 10/04/21   Riesa Pope, MD  ?fluPHENAZine  decanoate (PROLIXIN) 25 MG/ML injection Inject 32.5 mg into the muscle every 21 ( twenty-one) days.  ?Patient not taking: Reported on 09/27/2020    [provider]  ?fluticasone (FLONASE) 50 MCG/ACT nasal spray SPRAY 2 SPRAYS INTO EACH NOSTRIL EVERY DAY 10/23/21   Riesa Pope, MD  ?guaiFENesin (ROBITUSSIN) 100 MG/5ML SOLN Take 5 mLs (100 mg total) by mouth every 4 (four) hours as needed for cough or to loosen phlegm. 03/21/21   [name], MD  ?hydrocortisone (ANUSOL-HC) 2.5 % rectal cream Apply rectally 2 times daily as needed.  Do not use for more than 1 week at a time. ?Patient taking differently: Place 1 application rectally 2 (two) times daily as needed for hemorrhoids. Do not use for more than 1 week at a time. 12/31/15   Francesca Oman, DO  ?iron polysaccharides (NIFEREX) 150 MG capsule Take 1 capsule (150 mg total) by mouth daily. 12/20/21 03/20/22  [name], MD  ?loratadine (CLARITIN) 10 MG tablet Take 1 tablet (10 mg total) by mouth daily. 03/21/21   [name], MD  ?omeprazole (PRILOSEC) 20 MG capsule TAKE 1 CAPSULE BY MOUTH EVERY DAY 06/18/21   Katsadouros, Vasilios, MD  ?polyethylene glycol powder (GAVILAX) 17 GM/SCOOP powder TAKE 17 G BY MOUTH DAILY AS NEEDED FOR MILD CONSTIPATION. 08/29/21   [name], MD  ?pravastatin (PRAVACHOL) 40 MG tablet Take 1 tablet (40 mg total) by mouth daily. 09/27/20   Gilberto Better  K, MD  ?senna-docusate (SENOKOT-S) 8.6-50 MG tablet Take 1 tablet by mouth at bedtime. 03/21/21   [name], MD  ?sorbitol 70 % solution Take 15 mLs by mouth daily as needed. 08/29/21   [name], MD  ?triamterene-hydrochlorothiazide (DYAZIDE) 37.5-25 MG capsule TAKE 1 CAPSULE BY MOUTH EVERY DAY 11/14/21   Riesa Pope, MD  ?Verlee Monte (TUCKS) 50 % PADS Apply 1 application topically 3 (three) times daily as needed. Apply to anorectal area as needed 3 times daily or after bowel movements. 08/11/13   Francesca Oman, DO  ? ? ?Family History ?Family  History  ?Problem Relation Age of Onset  ? Hypertension Mother   ? Diabetes Maternal Aunt   ? ? ?Social History ?Social History  ? ?Tobacco Use  ? Smoking status: Never  ? Smokeless tobacco: Never  ?Substance Use Topics  ? Alcohol use: No  ?  Alcohol/week: 0.0 standard drinks  ? Drug use: No  ? ? ? ?Allergies   ?Robitussin dm [dextromethorphan-guaifenesin] ? ? ?Review of Systems ?Review of Systems  ?Gastrointestinal:  Positive for vomiting.  ? ? ?Physical Exam ?Triage Vital Signs ?ED Triage Vitals  ?Enc Vitals Group  ?   BP 03/18/22 1554 127/70  ?   Pulse Rate 03/18/22 1554 (!) 103  ?   Resp 03/18/22 1554 18  ?   Temp 03/18/22 1554 97.9 ?F (36.6 ?C)  ?   Temp src --   ?   SpO2 03/18/22 1554 95 %  ?   Weight --   ?   Height --   ?   Head Circumference --   ?   Peak Flow --   ?   Pain Score 03/18/22 1551 10  ?   Pain Loc --   ?   Pain Edu? --   ?   Excl. in [place]? --   ? ?No data found. ? ?Updated Vital Signs ?BP 127/70   Pulse (!) 103   Temp 97.9 ?F (36.6 ?C)   Resp 18   SpO2 95%  ? ?Visual Acuity ?Right Eye Distance:   ?Left Eye Distance:   ?Bilateral Distance:   ? ?Right Eye Near:   ?Left Eye Near:    ?Bilateral Near:    ? ?Physical Exam ?Vitals reviewed.  ?Constitutional:   ?   General: He is not in acute distress. ?   Appearance: He is not toxic-appearing.  ?HENT:  ?   Right Ear: Tympanic membrane and ear canal normal.  ?   Left Ear: Tympanic membrane and ear canal normal.  ?   Nose: Nose normal.  ?   Mouth/Throat:  ?   Mouth: Mucous membranes are moist.  ?   Pharynx: No oropharyngeal exudate or posterior oropharyngeal erythema.  ?Eyes:  ?   Extraocular Movements: Extraocular movements intact.  ?   Conjunctiva/sclera: Conjunctivae normal.  ?   Pupils: Pupils are equal, round, and reactive to light.  ?Cardiovascular:  ?   Rate and Rhythm: Normal rate and regular rhythm.  ?   Heart sounds: No murmur heard. ?Pulmonary:  ?   Effort: Pulmonary effort is normal.  ?   Breath sounds: No stridor. No wheezing, rhonchi or  rales.  ?Abdominal:  ?   General: There is distension (somewhat tympanitic).  ?   Tenderness: There is no guarding.  ?   Comments: BS quiet  ?Musculoskeletal:  ?   Cervical back: Neck supple.  ?Lymphadenopathy:  ?   Cervical: No cervical adenopathy.  ?  Skin: ?   Capillary Refill: Capillary refill takes less than 2 seconds.  ?   Coloration: Skin is not jaundiced or pale.  ?Neurological:  ?   General: No focal deficit present.  ?   Mental Status: He is alert and oriented to person, place, and time.  ?Psychiatric:     ?   Behavior: Behavior normal.  ? ? ? ?UC Treatments / Results  ?Labs ?(all labs ordered are listed, but only abnormal results are displayed) ?Labs Reviewed - No data to display ? ?EKG ? ? ?Radiology ?DG Abd 2 Views ? ?Result Date: 03/18/2022 ?CLINICAL DATA:  Vomiting. EXAM: ABDOMEN - 2 VIEW COMPARISON:  None. FINDINGS: Upright and supine views. The upright view excludes the upper abdomen. Demonstrates no free intraperitoneal air. Multiple small bowel air-fluid levels. The supine view demonstrates gaseous distension of small bowel loops of up to 5.1 cm. The upper abdomen is excluded. Paucity of colonic gas. No abnormal abdominal calcifications.   No appendicolith. IMPRESSION: Dilated small bowel with air-fluid levels. Favor small-bowel obstruction over adynamic ileus. Electronically Signed   By: Abigail Miyamoto M.D.   On: 03/18/2022 16:48   ? ?Procedures ?Procedures (including critical care time) ? ?Medications Ordered in UC ?Medications - No data to display ? ?Initial Impression / Assessment and Plan / UC Course  ?I have reviewed the triage vital signs and the nursing notes. ? ?Pertinent labs & imaging results that were available during my care of the patient were reviewed by me and considered in my medical decision making (see chart for details). ? ?  ? ?2 view abd shows AF levels and distended loops of small bowel. Will send to ER for further eval. ?Final Clinical Impressions(s) / UC Diagnoses  ? ?Final  diagnoses:  ?Ileus ([place])  ? ? ? ?Discharge Instructions   ? ?  ?Please proceed to the ER. I am worried you have an obstruction.  ? ?There are signs of intestinal obstruction on your xray ? ? ? ? ?ED Prescriptions

## 2022-03-18 NOTE — Discharge Instructions (Addendum)
Please proceed to the ER. I am worried you have an obstruction.  ? ?There are signs of intestinal obstruction on your xray ?

## 2022-03-19 ENCOUNTER — Inpatient Hospital Stay (HOSPITAL_COMMUNITY): Payer: Medicare Other

## 2022-03-19 ENCOUNTER — Emergency Department (HOSPITAL_COMMUNITY): Payer: Medicare Other

## 2022-03-19 DIAGNOSIS — N179 Acute kidney failure, unspecified: Secondary | ICD-10-CM | POA: Diagnosis present

## 2022-03-19 DIAGNOSIS — K56609 Unspecified intestinal obstruction, unspecified as to partial versus complete obstruction: Secondary | ICD-10-CM | POA: Diagnosis not present

## 2022-03-19 DIAGNOSIS — K56601 Complete intestinal obstruction, unspecified as to cause: Secondary | ICD-10-CM | POA: Diagnosis present

## 2022-03-19 DIAGNOSIS — D500 Iron deficiency anemia secondary to blood loss (chronic): Secondary | ICD-10-CM | POA: Diagnosis present

## 2022-03-19 DIAGNOSIS — E119 Type 2 diabetes mellitus without complications: Secondary | ICD-10-CM | POA: Diagnosis present

## 2022-03-19 DIAGNOSIS — R6250 Unspecified lack of expected normal physiological development in childhood: Secondary | ICD-10-CM | POA: Diagnosis present

## 2022-03-19 DIAGNOSIS — F79 Unspecified intellectual disabilities: Secondary | ICD-10-CM | POA: Diagnosis present

## 2022-03-19 DIAGNOSIS — Z79899 Other long term (current) drug therapy: Secondary | ICD-10-CM | POA: Diagnosis not present

## 2022-03-19 DIAGNOSIS — E781 Pure hyperglyceridemia: Secondary | ICD-10-CM | POA: Diagnosis present

## 2022-03-19 DIAGNOSIS — K920 Hematemesis: Secondary | ICD-10-CM | POA: Diagnosis present

## 2022-03-19 DIAGNOSIS — K5939 Other megacolon: Secondary | ICD-10-CM | POA: Diagnosis not present

## 2022-03-19 DIAGNOSIS — K6389 Other specified diseases of intestine: Secondary | ICD-10-CM | POA: Diagnosis present

## 2022-03-19 DIAGNOSIS — F200 Paranoid schizophrenia: Secondary | ICD-10-CM | POA: Diagnosis present

## 2022-03-19 DIAGNOSIS — I10 Essential (primary) hypertension: Secondary | ICD-10-CM | POA: Diagnosis present

## 2022-03-19 DIAGNOSIS — N281 Cyst of kidney, acquired: Secondary | ICD-10-CM | POA: Diagnosis not present

## 2022-03-19 DIAGNOSIS — K429 Umbilical hernia without obstruction or gangrene: Secondary | ICD-10-CM | POA: Diagnosis present

## 2022-03-19 DIAGNOSIS — Z833 Family history of diabetes mellitus: Secondary | ICD-10-CM | POA: Diagnosis not present

## 2022-03-19 DIAGNOSIS — Z4659 Encounter for fitting and adjustment of other gastrointestinal appliance and device: Secondary | ICD-10-CM | POA: Diagnosis not present

## 2022-03-19 DIAGNOSIS — K3189 Other diseases of stomach and duodenum: Secondary | ICD-10-CM | POA: Diagnosis present

## 2022-03-19 DIAGNOSIS — K5909 Other constipation: Secondary | ICD-10-CM | POA: Diagnosis present

## 2022-03-19 DIAGNOSIS — K219 Gastro-esophageal reflux disease without esophagitis: Secondary | ICD-10-CM | POA: Diagnosis present

## 2022-03-19 DIAGNOSIS — E876 Hypokalemia: Secondary | ICD-10-CM | POA: Diagnosis present

## 2022-03-19 DIAGNOSIS — Z8249 Family history of ischemic heart disease and other diseases of the circulatory system: Secondary | ICD-10-CM | POA: Diagnosis not present

## 2022-03-19 DIAGNOSIS — K5669 Other partial intestinal obstruction: Secondary | ICD-10-CM | POA: Diagnosis not present

## 2022-03-19 DIAGNOSIS — H913 Deaf nonspeaking, not elsewhere classified: Secondary | ICD-10-CM | POA: Diagnosis present

## 2022-03-19 HISTORY — DX: Other specified diseases of intestine: K63.89

## 2022-03-19 LAB — GLUCOSE, CAPILLARY
Glucose-Capillary: 113 mg/dL — ABNORMAL HIGH (ref 70–99)
Glucose-Capillary: 118 mg/dL — ABNORMAL HIGH (ref 70–99)
Glucose-Capillary: 134 mg/dL — ABNORMAL HIGH (ref 70–99)

## 2022-03-19 LAB — IRON AND TIBC
Iron: 32 ug/dL — ABNORMAL LOW (ref 45–182)
Saturation Ratios: 7 % — ABNORMAL LOW (ref 17.9–39.5)
TIBC: 451 ug/dL — ABNORMAL HIGH (ref 250–450)
UIBC: 419 ug/dL

## 2022-03-19 LAB — CBC WITH DIFFERENTIAL/PLATELET
Abs Immature Granulocytes: 0.08 10*3/uL — ABNORMAL HIGH (ref 0.00–0.07)
Basophils Absolute: 0 10*3/uL (ref 0.0–0.1)
Basophils Relative: 0 %
Eosinophils Absolute: 0 10*3/uL (ref 0.0–0.5)
Eosinophils Relative: 0 %
HCT: 39.2 % (ref 39.0–52.0)
Hemoglobin: 11.6 g/dL — ABNORMAL LOW (ref 13.0–17.0)
Immature Granulocytes: 1 %
Lymphocytes Relative: 11 %
Lymphs Abs: 1.1 10*3/uL (ref 0.7–4.0)
MCH: 21.5 pg — ABNORMAL LOW (ref 26.0–34.0)
MCHC: 29.6 g/dL — ABNORMAL LOW (ref 30.0–36.0)
MCV: 72.7 fL — ABNORMAL LOW (ref 80.0–100.0)
Monocytes Absolute: 1.2 10*3/uL — ABNORMAL HIGH (ref 0.1–1.0)
Monocytes Relative: 12 %
Neutro Abs: 7.5 10*3/uL (ref 1.7–7.7)
Neutrophils Relative %: 76 %
Platelets: 291 10*3/uL (ref 150–400)
RBC: 5.39 MIL/uL (ref 4.22–5.81)
RDW: 18 % — ABNORMAL HIGH (ref 11.5–15.5)
WBC: 9.9 10*3/uL (ref 4.0–10.5)
nRBC: 0 % (ref 0.0–0.2)

## 2022-03-19 LAB — MAGNESIUM: Magnesium: 1.9 mg/dL (ref 1.7–2.4)

## 2022-03-19 LAB — HEMOGLOBIN A1C
Hgb A1c MFr Bld: 6.4 % — ABNORMAL HIGH (ref 4.8–5.6)
Mean Plasma Glucose: 136.98 mg/dL

## 2022-03-19 LAB — CBG MONITORING, ED
Glucose-Capillary: 139 mg/dL — ABNORMAL HIGH (ref 70–99)
Glucose-Capillary: 116 mg/dL — ABNORMAL HIGH (ref 70–99)
Glucose-Capillary: 114 mg/dL — ABNORMAL HIGH (ref 70–99)

## 2022-03-19 LAB — GASTRIC OCCULT BLOOD (1-CARD TO LAB): Occult Blood, Gastric: POSITIVE — AB

## 2022-03-19 LAB — TYPE AND SCREEN
ABO/RH(D): A POS
Antibody Screen: NEGATIVE

## 2022-03-19 LAB — COMPREHENSIVE METABOLIC PANEL
ALT: 30 U/L (ref 0–44)
AST: 32 U/L (ref 15–41)
Albumin: 4 g/dL (ref 3.5–5.0)
Alkaline Phosphatase: 60 U/L (ref 38–126)
Anion gap: 13 (ref 5–15)
BUN: 22 mg/dL (ref 8–23)
CO2: 21 mmol/L — ABNORMAL LOW (ref 22–32)
Calcium: 8.9 mg/dL (ref 8.9–10.3)
Chloride: 102 mmol/L (ref 98–111)
Creatinine, Ser: 1.27 mg/dL — ABNORMAL HIGH (ref 0.61–1.24)
GFR, Estimated: >60 mL/min (ref >60)
Glucose, Bld: 195 mg/dL — ABNORMAL HIGH (ref 70–99)
Potassium: 3 mmol/L — ABNORMAL LOW (ref 3.5–5.1)
Sodium: 136 mmol/L (ref 135–145)
Total Bilirubin: 0.5 mg/dL (ref 0.3–1.2)
Total Protein: 8.3 g/dL — ABNORMAL HIGH (ref 6.5–8.1)

## 2022-03-19 LAB — HIV ANTIBODY (ROUTINE TESTING W REFLEX): HIV Screen 4th Generation wRfx: NONREACTIVE

## 2022-03-19 LAB — TSH: TSH: 0.383 u[IU]/mL (ref 0.350–4.500)

## 2022-03-19 LAB — LACTIC ACID, PLASMA
Lactic Acid, Venous: 2.6 mmol/L (ref 0.5–1.9)
Lactic Acid, Venous: 2.3 mmol/L (ref 0.5–1.9)
Lactic Acid, Venous: 1.1 mmol/L (ref 0.5–1.9)

## 2022-03-19 LAB — POTASSIUM: Potassium: 3.4 mmol/L — ABNORMAL LOW (ref 3.5–5.1)

## 2022-03-19 LAB — PHOSPHORUS: Phosphorus: 3.5 mg/dL (ref 2.5–4.6)

## 2022-03-19 LAB — FERRITIN: Ferritin: 22 ng/mL — ABNORMAL LOW (ref 24–336)

## 2022-03-19 LAB — ABO/RH: ABO/RH(D): A POS

## 2022-03-19 MED ORDER — ENOXAPARIN SODIUM 40 MG/0.4ML IJ SOSY
40.0000 mg | PREFILLED_SYRINGE | INTRAMUSCULAR | Status: DC
  Administered 2022-03-19 – 2022-03-24 (×6): 40 mg via SUBCUTANEOUS
  Filled 2022-03-19 (×6): qty 0.4

## 2022-03-19 MED ORDER — LIDOCAINE VISCOUS HCL 2 % MT SOLN: 15.0000 mL | Freq: Once | OROMUCOSAL | Status: DC

## 2022-03-19 MED ORDER — LACTATED RINGERS IV SOLN: INTRAVENOUS | Status: DC

## 2022-03-19 MED ORDER — HYDROMORPHONE HCL 1 MG/ML IJ SOLN
1.0000 mg | Freq: Once | INTRAMUSCULAR | Status: AC
  Administered 2022-03-19: 1 mg via INTRAVENOUS
  Filled 2022-03-19: qty 1

## 2022-03-19 MED ORDER — INSULIN ASPART 100 UNIT/ML IJ SOLN
0.0000 [IU] | INTRAMUSCULAR | Status: DC
  Administered 2022-03-19 (×2): 2 [IU] via SUBCUTANEOUS
  Administered 2022-03-19: 0 [IU] via SUBCUTANEOUS
  Administered 2022-03-22 (×2): 2 [IU] via SUBCUTANEOUS

## 2022-03-19 MED ORDER — ONDANSETRON HCL 4 MG PO TABS: 4.0000 mg | ORAL_TABLET | Freq: Four times a day (QID) | ORAL | Status: DC | PRN

## 2022-03-19 MED ORDER — LACTATED RINGERS IV BOLUS
1000.0000 mL | Freq: Once | INTRAVENOUS | Status: AC
  Administered 2022-03-19: 1000 mL via INTRAVENOUS

## 2022-03-19 MED ORDER — IOHEXOL 300 MG/ML  SOLN
100.0000 mL | Freq: Once | INTRAMUSCULAR | Status: AC | PRN
  Administered 2022-03-19: 100 mL via INTRAVENOUS

## 2022-03-19 MED ORDER — IOHEXOL 300 MG/ML  SOLN: 50.0000 mL | Freq: Once | INTRAMUSCULAR | Status: DC | PRN

## 2022-03-19 MED ORDER — SENNOSIDES-DOCUSATE SODIUM 8.6-50 MG PO TABS: 1.0000 | ORAL_TABLET | Freq: Every evening | ORAL | Status: DC | PRN

## 2022-03-19 MED ORDER — ACETAMINOPHEN 325 MG PO TABS: 650.0000 mg | ORAL_TABLET | Freq: Four times a day (QID) | ORAL | Status: DC | PRN

## 2022-03-19 MED ORDER — DIATRIZOATE MEGLUMINE & SODIUM 66-10 % PO SOLN
90.0000 mL | Freq: Once | ORAL | Status: DC
  Filled 2022-03-19: qty 90

## 2022-03-19 MED ORDER — ONDANSETRON HCL 4 MG/2ML IJ SOLN
4.0000 mg | Freq: Once | INTRAMUSCULAR | Status: AC
  Administered 2022-03-19: 4 mg via INTRAVENOUS
  Filled 2022-03-19: qty 2

## 2022-03-19 MED ORDER — PANTOPRAZOLE 80MG IVPB - SIMPLE MED
80.0000 mg | Freq: Once | INTRAVENOUS | Status: AC
  Administered 2022-03-19: 80 mg via INTRAVENOUS
  Filled 2022-03-19: qty 100

## 2022-03-19 MED ORDER — LIDOCAINE VISCOUS HCL 2 % MT SOLN
OROMUCOSAL | Status: AC
Start: 2022-03-19 — End: 2022-03-19
  Administered 2022-03-19: 1 mL
  Filled 2022-03-19: qty 15

## 2022-03-19 MED ORDER — POTASSIUM CHLORIDE 10 MEQ/100ML IV SOLN
10.0000 meq | INTRAVENOUS | Status: AC
  Administered 2022-03-19 (×5): 10 meq via INTRAVENOUS
  Filled 2022-03-19 (×5): qty 100

## 2022-03-19 MED ORDER — ONDANSETRON HCL 4 MG/2ML IJ SOLN
4.0000 mg | Freq: Four times a day (QID) | INTRAMUSCULAR | Status: DC | PRN
Start: 2022-03-19 — End: 2022-03-24

## 2022-03-19 MED ORDER — ACETAMINOPHEN 650 MG RE SUPP: 650.0000 mg | Freq: Four times a day (QID) | RECTAL | Status: DC | PRN

## 2022-03-19 NOTE — Consult Note (Signed)
? ? ? ? ?Consult Note ? ?Leonard Tucker ?1956/08/14  ?[id number].   ? ?Requesting MD: Dr. Dayna Barker ?Chief Complaint/Reason for Consult: SBO ? ?HPI:  ?67 y.o. male with medical history significant for developmental delay, deaf/mutism, HTN, IDA, chronic constipation, GERD, hemorrhoids, and umbilical hernia who presented to [facility] urgent care with abdominal pain and emesis with xray showing SB distension and he was sent to ED for further evaluation. He has chronic constipation for which he takes laxatives and senna docusate at baseline. He takes iron supplementation daily. He reports his last bowel movement was on Friday. In addition to abdominal symptoms patient has had cough for about one week and is taking OTC cough medications.  ? ?Last episode of emesis around 0100-0200.  Currently no nausea.  Abdominal pain is improved but still present, 5 out of 10 on pain scale. ? ?History obtained from patient with ASL interpreter as well as sister and mother who are at bedside. ? ?He has not had any prior abdominal surgeries ? ?ROS: ?Review of Systems  ?Constitutional:  Negative for chills and fever.  ?Respiratory:  Positive for cough. Negative for shortness of breath.   ?Cardiovascular:  Negative for chest pain and leg swelling.  ?Gastrointestinal:  Positive for abdominal pain, nausea and vomiting.  ? ?Family History  ?Problem Relation Age of Onset  ? Hypertension Mother   ? Diabetes Maternal Aunt   ? ? ?Past Medical History:  ?Diagnosis Date  ? Chronic constipation   ? Deaf-mutism   ? Dental caries   ? History of iron deficiency anemia   ? Hypertriglyceridemia   ? Intertrigo   ? MR (mental retardation)   ? Schizophrenia, paranoid, chronic ([place])   ? Umbilical hernia   ? ? ?No past surgical history on file. ? ?Social History:  reports that he has never smoked. He has never used smokeless tobacco. He reports that he does not drink alcohol and does not use drugs. ? ?Allergies:  ?Allergies  ?Allergen Reactions  ? Robitussin Dm  [Dextromethorphan-Guaifenesin]   ?  "Acted out more than normal"  ? ? ?(Not in a hospital admission) ? ? ?Blood pressure 111/74, pulse 94, temperature 97.8 ?F (36.6 ?C), resp. rate 19, SpO2 97 %. ?Physical Exam: ?General: pleasant, WD, male who is laying in bed in NAD ?HEENT: head is normocephalic, atraumatic.  Mouth is pink and moist ?Heart: regular, rate, and rhythm.  ?Lungs: CTAB.  Respiratory effort nonlabored on supp O2 via [place] ?Abd: soft, protuberant, +BS, no masses, or organomegaly. Umbilical hernia soft and reduces easily. Very mild TTP across lower abdomen without rebound or guarding ?MSK: all 4 extremities are symmetrical with no cyanosis, clubbing, or edema. ?Skin: warm and dry with no masses, lesions, or rashes ?Psych: A&Ox3 with an appropriate affect.  ? ? ?Results for orders placed or performed during the hospital encounter of 03/18/22 (from the past 48 hour(s))  ?CBC with Differential     Status: Abnormal  ? Collection Time: 03/19/22 12:26 AM  ?Result Value Ref Range  ? WBC 9.9 4.0 - 10.5 K/uL  ? RBC 5.39 4.22 - 5.81 MIL/uL  ? Hemoglobin 11.6 (L) 13.0 - 17.0 g/dL  ? HCT 39.2 39.0 - 52.0 %  ? MCV 72.7 (L) 80.0 - 100.0 fL  ? MCH 21.5 (L) 26.0 - 34.0 pg  ? MCHC 29.6 (L) 30.0 - 36.0 g/dL  ? RDW 18.0 (H) 11.5 - 15.5 %  ? Platelets 291 150 - 400 K/uL  ? nRBC 0.0 0.0 -  0.2 %  ? Neutrophils Relative % 76 %  ? Neutro Abs 7.5 1.7 - 7.7 K/uL  ? Lymphocytes Relative 11 %  ? Lymphs Abs 1.1 0.7 - 4.0 K/uL  ? Monocytes Relative 12 %  ? Monocytes Absolute 1.2 (H) 0.1 - 1.0 K/uL  ? Eosinophils Relative 0 %  ? Eosinophils Absolute 0.0 0.0 - 0.5 K/uL  ? Basophils Relative 0 %  ? Basophils Absolute 0.0 0.0 - 0.1 K/uL  ? Immature Granulocytes 1 %  ? Abs Immature Granulocytes 0.08 (H) 0.00 - 0.07 K/uL  ?  Comment: Performed at [facility], [place] [address], [place], [place] [address]  ?Comprehensive metabolic panel     Status: Abnormal  ? Collection Time: 03/19/22 12:26 AM  ?Result Value Ref Range  ? Sodium 136 135 -  145 mmol/L  ? Potassium 3.0 (L) 3.5 - 5.1 mmol/L  ? Chloride 102 98 - 111 mmol/L  ? CO2 21 (L) 22 - 32 mmol/L  ? Glucose, Bld 195 (H) 70 - 99 mg/dL  ?  Comment: Glucose reference range applies only to samples taken after fasting for at least 8 hours.  ? BUN 22 8 - 23 mg/dL  ? Creatinine, Ser 1.27 (H) 0.61 - 1.24 mg/dL  ? Calcium 8.9 8.9 - 10.3 mg/dL  ? Total Protein 8.3 (H) 6.5 - 8.1 g/dL  ? Albumin 4.0 3.5 - 5.0 g/dL  ? AST 32 15 - 41 U/L  ? ALT 30 0 - 44 U/L  ? Alkaline Phosphatase 60 38 - 126 U/L  ? Total Bilirubin 0.5 0.3 - 1.2 mg/dL  ? GFR, Estimated >60 >60 mL/min  ?  Comment: (NOTE) ?Calculated using the CKD-EPI Creatinine Equation (2021) ?  ? Anion gap 13 5 - 15  ?  Comment: Performed at [facility], [place] [address], [place], [place] [address]  ?Gastric occult blood (1-card to lab)     Status: Abnormal  ? Collection Time: 03/19/22  1:40 AM  ?Result Value Ref Range  ? pH, Gastric NOT DONE   ? Occult Blood, Gastric POSITIVE (A) NEGATIVE  ?  Comment: Performed at [facility], [place] [address], [place], [place] [address]  ?Type and screen     Status: None  ? Collection Time: 03/19/22  4:00 AM  ?Result Value Ref Range  ? ABO/RH(D) A POS   ? Antibody Screen NEG   ? Sample Expiration    ?  03/22/2022,2359 ?Performed at [facility], [place] [address], [place], [place] [address] ?  ?Lactic acid, plasma     Status: Abnormal  ? Collection Time: 03/19/22  4:01 AM  ?Result Value Ref Range  ? Lactic Acid, Venous 2.6 (HH) 0.5 - 1.9 mmol/L  ?  Comment: CRITICAL RESULT CALLED TO, READ BACK BY AND VERIFIED WITH: ?Purcell Mouton RN 03/19/22 0500 Wiliam Ke ?Performed at [facility], [place] [address], [place],  [address] ?  ?ABO/Rh     Status: None (Preliminary result)  ? Collection Time: 03/19/22  4:22 AM  ?Result Value Ref Range  ? ABO/RH(D) PENDING   ?CBG monitoring, ED     Status: Abnormal  ? Collection Time: 03/19/22  5:49 AM  ?Result Value Ref Range  ? Glucose-Capillary 139 (H) 70 - 99 mg/dL  ?   Comment: Glucose reference range applies only to samples taken after fasting for at least 8 hours.  ? Comment 1 Notify RN   ? Comment 2 Document  in Chart   ?Lactic acid, plasma     Status: Abnormal  ? Collection Time: 03/19/22  6:07 AM  ?Result Value Ref Range  ? Lactic Acid, Venous 2.3 (HH) 0.5 - 1.9 mmol/L  ?  Comment: CRITICAL VALUE NOTED.  VALUE IS CONSISTENT WITH PREVIOUSLY REPORTED AND CALLED VALUE. ?Performed at [facility], [place] [address], [place], [place] [address] ?  ?Magnesium     Status: None  ? Collection Time: 03/19/22  6:07 AM  ?Result Value Ref Range  ? Magnesium 1.9 1.7 - 2.4 mg/dL  ?  Comment: Performed at [facility], [place] [address] [place], [place] [address]  ?Phosphorus     Status: None  ? Collection Time: 03/19/22  6:07 AM  ?Result Value Ref Range  ? Phosphorus 3.5 2.5 - 4.6 mg/dL  ?  Comment: Performed at [facility], [place] [address] [place], [place] [address]  ?TSH     Status: None  ? Collection Time: 03/19/22  6:07 AM  ?Result Value Ref Range  ? TSH 0.383 0.350 - 4.500 uIU/mL  ?  Comment: Performed by a 3rd Generation assay with a functional sensitivity of <=0.01 uIU/mL. ?Performed at [facility], [place] [address] [place], [place] [address] ?  ?Iron and TIBC     Status: Abnormal  ? Collection Time: 03/19/22  6:07 AM  ?Result Value Ref Range  ? Iron 32 (L) 45 - 182 ug/dL  ? TIBC 451 (H) 250 - 450 ug/dL  ? Saturation Ratios 7 (L) 17.9 - 39.5 %  ? UIBC 419 ug/dL  ?  Comment: Performed at [facility], [place] [address] [place], [place] [address]  ?Ferritin     Status: Abnormal  ? Collection Time: 03/19/22  6:07 AM  ?Result Value Ref Range  ? Ferritin 22 (L) 24 - 336 ng/mL  ?  Comment: Performed at [facility], [place] [address] [place], [place] [address]  ?Hemoglobin A1c     Status: Abnormal  ? Collection Time: 03/19/22  6:07 AM  ?Result Value Ref Range  ? Hgb A1c MFr Bld 6.4 (H) 4.8 - 5.6 %  ?  Comment: (NOTE) ?Pre diabetes:          5.7%-6.4% ? ?Diabetes:               >6.4% ? ?Glycemic control for   <7.0% ?adults with diabetes ?  ? Mean Plasma Glucose 136.98 mg/dL  ?  Comment: Performed at [facility], [place] [address] [place], [place] [address]  ? ?CT A

## 2022-03-19 NOTE — Progress Notes (Signed)
? ? ?HD#0 ?SUBJECTIVE:  ?Patient Summary: Leonard Tucker is a 66 y.o. with a pertinent PMH of hearing impairment, iron deficiency anemia who presented with vomiting and admitted for small bowel obstruction.  ? ?Overnight Events: Continued coffee-ground emesis.  Patient could not tolerate NG tube placement at bedside. ? ?  ?Interm History: Patient seen and evaluated bedside.  Discussed with family members who provided assistance in communicating with the patient.  Patient did not appear to be in pain.  Last reported episode of emesis during our exam was several hours prior around 1 AM.  Not passing flatus at the time. ? ?OBJECTIVE:  ?Vital Signs: ?Vitals:  ? 03/19/22 1100 03/19/22 1115 03/19/22 1130 03/19/22 1145  ?BP: 135/86 127/79 121/79 122/79  ?Pulse: 95 95 (!) 103 97  ?Resp: 18 (!) 21 (!) 21 13  ?Temp:      ?SpO2: 96% 97% 96% 97%  ? ?Supplemental O2:    ?SpO2: 97 % ? ?There were no vitals filed for this visit. ? ? ?Intake/Output Summary (Last 24 hours) at 03/19/2022 1626 ?Last data filed at 03/19/2022 1137 ?Gross per 24 hour  ?Intake 400 ml  ?Output --  ?Net 400 ml  ? ?Net IO Since Admission: 400 mL [03/19/22 1626] ? ?Physical Exam: ?General: NAD, nl appearance ?HE: Normocephalic, atraumatic, EOMI, Conjunctivae normal ?Cardiovascular: Normal rate, regular rhythm. No murmurs, rubs, or gallops ?Pulmonary: Effort normal, breath sounds normal. No wheezes, rales, or rhonchi, 2L [place] ?Abdominal: soft, nontender, moderately distended, positive bowel sounds ?Musculoskeletal: no swelling, deformity, injury or tenderness in extremities ?Skin: Warm, dry, no bruising, erythema, or rash ?Psychiatric/Behavioral: normal mood, normal behavior  ? ?Patient Lines/Drains/Airways Status   ? ? Active Line/Drains/Airways   ? ? Name Placement date Placement time Site Days  ? Peripheral IV 03/19/22 20 G Anterior;Distal;Right;Upper Arm 03/19/22  0017  Arm  less than 1  ? ?  ?  ? ?  ? ? ?Pertinent Labs: ? ?  Latest Ref Rng & Units 03/19/2022   ? 12:26 AM 08/29/2021  ? 10:15 AM 03/21/2021  ?  2:19 PM  ?CBC  ?WBC 4.0 - 10.5 K/uL 9.9   6.4   6.4    ?Hemoglobin 13.0 - 17.0 g/dL 11.6   12.0   12.2    ?Hematocrit 39.0 - 52.0 % 39.2   39.1   40.1    ?Platelets 150 - 400 K/uL 291   265   312    ? ? ? ?  Latest Ref Rng & Units 03/19/2022  ? 12:26 AM 08/29/2021  ? 10:15 AM 07/20/2020  ?  9:55 AM  ?CMP  ?Glucose 70 - 99 mg/dL 195   94   81    ?BUN 8 - 23 mg/dL '22   9   13    '$ ?Creatinine 0.61 - 1.24 mg/dL 1.27   0.98   1.02    ?Sodium 135 - 145 mmol/L 136   141   141    ?Potassium 3.5 - 5.1 mmol/L 3.0   4.0   3.8    ?Chloride 98 - 111 mmol/L 102   101   101    ?CO2 22 - 32 mmol/L '21   25   24    '$ ?Calcium 8.9 - 10.3 mg/dL 8.9   9.6   9.5    ?Total Protein 6.5 - 8.1 g/dL 8.3      ?Total Bilirubin 0.3 - 1.2 mg/dL 0.5      ?Alkaline Phos 38 - 126  U/L 60      ?AST 15 - 41 U/L 32      ?ALT 0 - 44 U/L 30      ? ? ?Recent Labs  ?  03/19/22 ?1610 03/19/22 ?1009 03/19/22 ?1205  ?GLUCAP 139* 116* 114*  ?  ? ?Pertinent Imaging: ?CT ABDOMEN PELVIS W CONTRAST ? ?Addendum Date: 03/19/2022   ?ADDENDUM REPORT: 03/19/2022 03:06 ADDENDUM: These results were called by telephone at the time of interpretation on 03/19/2022 at 2:56 am to provider [facility] , who verbally acknowledged these results. Electronically Signed   By: Fidela Salisbury M.D.   On: 03/19/2022 03:06  ? ?Result Date: 03/19/2022 ?CLINICAL DATA:  Abdominal pain, acute, nonlocalized. Small-bowel obstruction. EXAM: CT ABDOMEN AND PELVIS WITH CONTRAST TECHNIQUE: Multidetector CT imaging of the abdomen and pelvis was performed using the standard protocol following bolus administration of intravenous contrast. RADIATION DOSE REDUCTION: This exam was performed according to the departmental dose-optimization program which includes automated exposure control, adjustment of the mA and/or kV according to patient size and/or use of iterative reconstruction technique. CONTRAST:  164m OMNIPAQUE IOHEXOL 300 MG/ML  SOLN COMPARISON:  None.  FINDINGS: Lower chest: Mild bibasilar atelectasis. Cardiac size is within normal limits. Small fluid within the distal esophagus may relate to gastroesophageal reflux. Hepatobiliary: There is peripheral portal venous gas within the non dependent left hepatic lobe. No enhancing intrahepatic mass. No intra or extrahepatic biliary ductal dilation. Gallbladder unremarkable. Pancreas: Unremarkable Spleen: Unremarkable Adrenals/Urinary Tract: The adrenal glands are unremarkable. Multiple simple renal cortical and parapelvic cysts are seen bilaterally. No follow-up imaging is recommended for these lesions. No enhancing intra cortical lesion is identified. No hydronephrosis. No intrarenal or ureteral calculi. The bladder is unremarkable. Stomach/Bowel: There is pneumatosis involving the gastric fundus with submucosal gas identified non dependently. There is gas noted within several mesenteric venous branches along the greater curvature of the stomach, axial image # 14/3 and 15/3. No free intraperitoneal gas. A a mid to distal small bowel obstruction is present with a single point of transition identified within the right lower quadrant anteriorly at axial image # 67/3 and coronal image # 53/6. The proximal small bowel is dilated and fluid-filled. Distally, the small and large bowel are decompressed. The appendix is absent. No free intraperitoneal fluid. Vascular/Lymphatic: Aortic atherosclerosis. No enlarged abdominal or pelvic lymph nodes. Reproductive: Mild prostatic enlargement. Other: Small fat containing umbilical hernia. Musculoskeletal: No acute bone abnormality. No lytic or blastic bone lesion. IMPRESSION: Mid to distal small bowel obstruction. Pneumatosis of the gastric fundus. Mesenteric and portal venous gas. No free intraperitoneal gas. Fluid distension of the stomach and proximal small bowel. Fluid within the distal esophagus may relate to changes of gastroesophageal reflux. Aortic Atherosclerosis  (ICD10-I70.0). Electronically Signed: By: AFidela SalisburyM.D. On: 03/19/2022 02:51  ? ?DG Abd 2 Views ? ?Result Date: 03/18/2022 ?CLINICAL DATA:  Vomiting. EXAM: ABDOMEN - 2 VIEW COMPARISON:  None. FINDINGS: Upright and supine views. The upright view excludes the upper abdomen. Demonstrates no free intraperitoneal air. Multiple small bowel air-fluid levels. The supine view demonstrates gaseous distension of small bowel loops of up to 5.1 cm. The upper abdomen is excluded. Paucity of colonic gas. No abnormal abdominal calcifications.   No appendicolith. IMPRESSION: Dilated small bowel with air-fluid levels. Favor small-bowel obstruction over adynamic ileus. Electronically Signed   By: KAbigail MiyamotoM.D.   On: 03/18/2022 16:48   ? ?ASSESSMENT/PLAN:  ?Assessment: ?Principal Problem: ?  Small bowel obstruction ([place] ?Active Problems: ?  Iron deficiency anemia due to chronic blood loss ?  Pneumatosis of intestines ?  Acute kidney injury ([place]) ? ?SBO ?Chronic constipation ?History of umbilical hernia ?Patient has had decrease in his emesis.  Small amounts of coffee-ground emesis reported infrequently.  Coffee-ground emesis likely Mallory-Weiss tear from frequent vomiting.  Patient is not reporting any pain at this time.  His abdomen is soft, nontender, positive bowel sounds.  Vital signs are stable at this time. ?- Surgery is following along with no plans for surgery currently.  NG tube placement attempted x2 at bedside, however this was unsuccessful.  Patient will have NG tube placed under fluoroscopy guidance later today.  He has some high risk findings noted on CT including pneumatosis in gastric body and portal veins. ?Keep n.p.o. for now.   ?- Small bowel protocol.   ?- Continue to monitor NG tube output  ?- keep hydrated with IV fluids ?- Zofran and Senokot. ?- Mobilize as able. ?- Tylenol for pain control. ? ?Iron deficiency anemia. ?Patient does have a history of hemorrhoids which may explain his iron deficiency  anemia, however given this new bowel obstruction and absence of prior surgeries there is concern for external compression from malignancy.  This would need to be further evaluated with colonoscopy once patient is out of

## 2022-03-19 NOTE — H&P (Addendum)
? ? ? ?Date: 03/19/2022     ?     ?     ?Patient Name:  Leonard Tucker MRN: [id number]  ?DOB: [birth year] Age / Sex: 66 y.o., male   ?PCP: Riesa Pope, MD    ?     ?Medical Service: Internal Medicine Teaching Service    ?     ?Attending Physician: Dr. Evette Doffing, Mallie Mussel, *    ?First Contact: Dr. Elliot Gurney Pager: [phone]  ?Second Contact: Dr. Johnney Ou Pager: [phone]  ?     ?After Hours (After 5p/  First Contact Pager: [phone]  ?weekends / holidays): Second Contact Pager: [phone]  ? ?Chief Complaint: emesis ? ?History of Present Illness: Leonard Tucker is a 66 y.o. male with a PMHx of HTN, IDA, chronic constipation, GERD, hemorrhoids, and an umbilical hernia who presents here today with a chief complaint of emesis. History obtained from sister and mom. ? ?Patient started having profound emesis yesterday morning, which family described as brown with blood in it. Mother reports everyone at home has had cough and cold symptoms for 1.5 weeks. They gave him OTC cold medicine earlier yesterday, and then he started vomiting.  He has not had a bowel movement since about 2 days ago, he had a very small BM at that time.  Initially seen at [facility], then transferred to the ED tonight. States that he had abdominal pain at the onset of symptoms, however he denies any abdominal pain now.  Denies any abdominal surgeries, fevers, hematuria, shortness of breath, chest pain, diarrhea.  No other symptoms that family can think of. ? ?He takes Purelax and senna-docusate at home for chronic constipation.  He takes an iron supplement daily, which mother says sometimes causes him to be constipated.  Patient has hx of hemorrhoids. No other complaints or concerns today. ? ?Meds:  ?Atenolol 25 mg daily ?Iron supplement daily ?Dyazide 37.5-25 mg daily ?Polyethylene glycol 7g as needed for constipation ?Senna-docusate prn ?Omeprazole 20 mg daily ?Flonase twice daily ? ?Allergies: ?Allergies as of 03/18/2022 - Review Complete 03/18/2022   ?Allergen Reaction Noted  ? Robitussin dm [dextromethorphan-guaifenesin]  07/15/2015  ? ?Past Medical History:  ?Diagnosis Date  ? Chronic constipation   ? Deaf-mutism   ? Dental caries   ? History of iron deficiency anemia   ? Hypertriglyceridemia   ? Intertrigo   ? MR (mental retardation)   ? Schizophrenia, paranoid, chronic ([place])   ? Umbilical hernia   ? ?Family History: DM in sister and maternal aunt. Hypertension in mother ? ?Social History: Lives at home with mother and sister. Independent with his iADLs.  Mother manages his medications at home.  Denies any smoking, ETOH or illicit drug use.  ? ?Review of Systems: ?A complete ROS was negative except as per HPI.  ? ?Physical Exam: ?Blood pressure 138/80, pulse 100, temperature 97.8 ?F (36.6 ?C), resp. rate 20, SpO2 98 %. ?General: NAD, nl appearance ?HE: Normocephalic, atraumatic, EOMI, Conjunctivae normal ?ENT: No congestion, no rhinorrhea, no exudate or erythema  ?Cardiovascular: Normal rate, regular rhythm. No murmurs, rubs, or gallops ?Pulmonary: Effort normal, breath sounds normal. No wheezes, rales, or rhonchi ?Abdominal: soft, nontender, moderately distended, high-pitched tinkling sounds appreciated  ?Musculoskeletal: no swelling, deformity, injury or tenderness in extremities ?Skin: Warm, dry, no bruising, erythema, or rash ?Psychiatric/Behavioral: normal mood, normal behavior    ? ?CT abd/pelvis with contrast: ?Mid to distal small bowel obstruction. ?  ?Pneumatosis of the gastric fundus. Mesenteric and portal venous gas. ?No free  intraperitoneal gas. ?  ?Fluid distension of the stomach and proximal small bowel. Fluid ?within the distal esophagus may relate to changes of ?gastroesophageal reflux. ?  ?Aortic Atherosclerosis. ? ?Assessment & Plan by Problem: ?Principal Problem: ?  Small bowel obstruction (HCC) ? ?#SBO ?#Chronic constipation ?#History of umbilical hernia ?#Hypokalemia ?Patient presents here with multiple episodes of coffee-ground emesis,  last BM two days ago prior to onset of his vomiting.  No prior abdominal surgeries, has a history of an umbilical hernia. On arrival, he is tachycardic but otherwise hemodynamically stable. K of 3. Lactic acid of 2.6. Hemoccult positive (gastric). Patient has imaging findings consistent with mid to distal small bowel obstruction. He also has imaging findings of pneumatosis of the gastric fundus reflecting a mucosal tear, likely from his multiple episodes of emesis. Per ED provider, surgery will place NG tube and evaluate the patient in the a.m.; there is currently no plan for surgery.  ?-Surgery is on board, appreciate their recommendations ?-Plan for NG tube placement ?-Continue LR infusion at 125 mL/hour ?-Senokot as needed ?-Zofran as needed ?-Tylenol as needed ?-Trend BMP ? ?#AKI ?Presents with a creatinine of 1.27 today, baseline around 0.9-1.  Likely prerenal in the setting of emesis. ?-Continue LR infusion at 125 mL/hour ?-Trend BMP ? ?#IDA ?Patient has a history of iron deficiency anemia for which he takes iron supplementation at home.  Presented today with hemoglobin of 11.6 in the setting of coffee-ground emesis, though prior hemoglobin values for the past few years have been documented around 11-12.  ?-Trend CBC ?-Ferritin, iron, and TIBC pending ? ?#GERD ?-Continue home omeprazole ? ?#HTN ?Patient has been in the hypertensive range since admission.  He is on triamterene-HCTZ and atenolol at home for his hypertension. ?-Continue home triamterene-HCTZ ?-Continue atenolol ? ?Dispo: Admit patient to Observation with expected length of stay less than 2 midnights. ? ?Signed: ?Orvis Brill, MD ?03/19/2022, 5:19 AM  ?Pager: [phone] ?After 5pm on weekdays and 1pm on weekends: On Call pager: [phone] ? ?

## 2022-03-19 NOTE — Progress Notes (Signed)
Patient re-examined. Abdomen remains soft. He is completely nontender to palpation and nods at 0/10 on wall pain scale. Vital signs remain stable. No current indication for emergent surgery. ?

## 2022-03-19 NOTE — Progress Notes (Signed)
Patient has an old hump on right upper back that has been present for a long time per patient and sister. Hump is clean dry and intact. No redness noted. ?

## 2022-03-19 NOTE — ED Notes (Signed)
Pt transported to fluoro for NG tube placement.  ?

## 2022-03-19 NOTE — Hospital Course (Signed)
Patient started having profound emesis yesterday morning described as brown with blood in it.  ?Mother reports everyone at home has had cough and cold symptoms for 1.5 weeks. They gave him OTC cold medicine and he started vomiting. ?He takes Miralax at home for chronic constipation. Patient has hx of hemorrhoids.  ? ?Denies any abd surgeries. No fever, hematuria, SOB, CP, diarrhea ? ?Atenolol 25 mg daily ?Iron supplement daily ?Dyazide 1 capsule daily ? ? ?FHx: DM in sister and maternal aunt. Hypertension in mother ? ?Shx: Lives at home with mother and sister. Independent with his iADLs. Denies any smoking, ETOH or illicit drug use.  ?

## 2022-03-19 NOTE — Progress Notes (Signed)
Patient arrived to [facility] room 25 alert and orientated x4. NG tube in left nare. Bed in lowest position call light in reach. Sister at bedside. ?

## 2022-03-19 NOTE — ED Notes (Signed)
Pt had episode of coffee ground emesis. MD made aware. Pt changed and reconnected back to monitor. ?

## 2022-03-20 DIAGNOSIS — K56609 Unspecified intestinal obstruction, unspecified as to partial versus complete obstruction: Secondary | ICD-10-CM | POA: Diagnosis not present

## 2022-03-20 LAB — CBC
HCT: 30.5 % — ABNORMAL LOW (ref 39.0–52.0)
Hemoglobin: 9.3 g/dL — ABNORMAL LOW (ref 13.0–17.0)
MCH: 21.7 pg — ABNORMAL LOW (ref 26.0–34.0)
MCHC: 30.5 g/dL (ref 30.0–36.0)
MCV: 71.3 fL — ABNORMAL LOW (ref 80.0–100.0)
Platelets: 217 10*3/uL (ref 150–400)
RBC: 4.28 MIL/uL (ref 4.22–5.81)
RDW: 17.1 % — ABNORMAL HIGH (ref 11.5–15.5)
WBC: 9.7 10*3/uL (ref 4.0–10.5)
nRBC: 0 % (ref 0.0–0.2)

## 2022-03-20 LAB — GLUCOSE, CAPILLARY
Glucose-Capillary: 105 mg/dL — ABNORMAL HIGH (ref 70–99)
Glucose-Capillary: 103 mg/dL — ABNORMAL HIGH (ref 70–99)
Glucose-Capillary: 97 mg/dL (ref 70–99)
Glucose-Capillary: 89 mg/dL (ref 70–99)
Glucose-Capillary: 87 mg/dL (ref 70–99)

## 2022-03-20 LAB — BASIC METABOLIC PANEL
Anion gap: 6 (ref 5–15)
BUN: 12 mg/dL (ref 8–23)
CO2: 29 mmol/L (ref 22–32)
Calcium: 8 mg/dL — ABNORMAL LOW (ref 8.9–10.3)
Chloride: 102 mmol/L (ref 98–111)
Creatinine, Ser: 0.85 mg/dL (ref 0.61–1.24)
GFR, Estimated: >60 mL/min (ref >60)
Glucose, Bld: 106 mg/dL — ABNORMAL HIGH (ref 70–99)
Potassium: 3.3 mmol/L — ABNORMAL LOW (ref 3.5–5.1)
Sodium: 137 mmol/L (ref 135–145)

## 2022-03-20 MED ORDER — PANTOPRAZOLE SODIUM 40 MG IV SOLR
40.0000 mg | INTRAVENOUS | Status: DC
  Administered 2022-03-20 – 2022-03-23 (×4): 40 mg via INTRAVENOUS
  Filled 2022-03-20 (×4): qty 10

## 2022-03-20 MED ORDER — POTASSIUM CHLORIDE 10 MEQ/100ML IV SOLN
10.0000 meq | INTRAVENOUS | Status: AC
  Administered 2022-03-20 (×2): 10 meq via INTRAVENOUS
  Filled 2022-03-20 (×2): qty 100

## 2022-03-20 NOTE — Progress Notes (Addendum)
? ?  Subjective: No acute overnight events.  Patient evaluated this morning with the assistance of an ASL interpreter via the video service [employer].  This morning states he is tolerating the tube okay.  Denies any more episodes of emesis.  Denies any abdominal pain.  Has not had a bowel movement and denies passing any gas. ? ?Objective: ? ?Vital signs in last 24 hours: ?Vitals:  ? 03/19/22 1145 03/19/22 1831 03/19/22 2206 03/20/22 0645  ?BP: 122/79 119/81 114/73 136/80  ?Pulse: 97 88 80 88  ?Resp: '13 17 18 18  '$ ?Temp:  98 ?F (36.7 ?C) 98 ?F (36.7 ?C) 98 ?F (36.7 ?C)  ?TempSrc:  Oral Oral Oral  ?SpO2: 97% 99% 97% 100%  ? ?Physical Exam ?General: alert, appears stated age, in no acute distress ?HEENT: Normocephalic, atraumatic, EOM intact, conjunctiva normal ?CV: Regular rate and rhythm, no murmurs rubs or gallops ?Pulm: Clear to auscultation bilaterally, normal work of breathing ?Abdomen: Soft, mild distention, bowel sounds present, no tenderness to palpation ?MSK: No lower extremity edema ?Skin: Warm and dry ?Neuro: Alert and oriented x3  ? ?Assessment/Plan: ? ?Principal Problem: ?  Small bowel obstruction ([place]) ?Active Problems: ?  Iron deficiency anemia due to chronic blood loss ?  Pneumatosis of intestines ?  Acute kidney injury ([place]) ? ?Leonard Tucker is a 66 y.o. with a pertinent PMH of hearing impairment, iron deficiency anemia who presented with vomiting and admitted for small bowel obstruction.  ? ?SBO ?Gastric pneumatosis ?Patient successfully had an NG tube placed via fluoroscopic image guidance yesterday, with documented 1200 cc of output.  He is tolerating tube placement.  Denies any more episodes of emesis but has not had a bowel movement.  Patient had an NG tube placed under fluoroscopic image guidance yesterday.  Abdominal CT scan noted to have several high risk features on the CT scan including pneumatosis in the gastric body and portal veins, small bowel obstruction with a transition point in the right  lower quadrant, no colonic distention.  It is possible that the gastric pneumatosis was in the setting of persistent emesis.  General surgery is following, plan to continue conservative management. ?-General surgery following, appreciate recommendations ?-Continue NG tube with LIWS ?-Keep K greater than 4 and mag greater than 2 for bowel function ?-Encourage mobilization ?-IV Protonix ?-Continue IV fluids ? ?Iron deficiency anemia ?Hemoglobin dropped from 11.6-9.3, possibly dilutional as all cell lines are down.  Continue to monitor.  Patient is on Lovenox for VTE prophylaxis; if hemoglobin continues to downtrend will hold this.  Will likely need to be further evaluated with colonoscopy outpatient. ?-Trend CBC ?-Discontinue Lovenox if worsening hematemesis ? ?Hypokalemia ?Potassium 3.3 this morning, replating with IV K. ?-Trend BMP ? ?AKI, resolved ?Creatinine 0.85 down from 1.27. ? ?Prior to Admission Living Arrangement: Home ?Anticipated Discharge Location: Home ?Barriers to Discharge: Clinical improvement ?Dispo: Anticipated discharge in approximately 2-3 day(s).  ? ?Mike Craze, DO ?03/20/2022, 7:36 AM ?Pager: [phone] ?After 5pm on weekdays and 1pm on weekends: On Call pager [phone]  ?

## 2022-03-20 NOTE — ED Provider Notes (Signed)
?[facility] ?Provider Note ? ? ?CSN: [id number] ?Arrival date & time: 03/18/22  2249 ? ?  ? ?History ? ?Chief Complaint  ?Patient presents with  ? Abdominal Pain  ? ? ?Leonard Tucker is a 66 y.o. male. ? 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 Started this morning with emesis. Went to UC, found to have dilated bowel, sent here. Eventually he came. Has had multiple more episodes of emesis. No fever. One hard small stool earlier today, but not since then. Leonard Tucker cares for him at home.  ? ? ?Abdominal Pain ? ?  ? ?Home Medications ?Prior to Admission medications   ?Medication Sig Start Date End Date Taking? Authorizing Provider  ?acetaminophen (TYLENOL) 650 MG CR tablet Take 650 mg by mouth daily as needed for pain.   Yes [provider]  ?atenolol (TENORMIN) 25 MG tablet TAKE 1 TABLET BY MOUTH EVERY DAY ?Patient taking differently: Take 25 mg by mouth every morning. 10/04/21  Yes Katsadouros, Vasilios, MD  ?fluPHENAZine decanoate (PROLIXIN) 25 MG/ML injection Inject 32.5 mg into the muscle every 21 ( twenty-one) days. Administered at [facility] in [facility]   Yes [provider]  ?fluticasone (FLONASE) 50 MCG/ACT nasal spray SPRAY 2 SPRAYS INTO EACH NOSTRIL EVERY DAY ?Patient taking differently: 2 sprays daily as needed for allergies or rhinitis. 10/23/21  Yes Katsadouros, Vasilios, MD  ?hydrocortisone (ANUSOL-HC) 2.5 % rectal cream Apply rectally 2 times daily as needed.  Do not use for more than 1 week at a time. ?Patient taking differently: Place 1 application. rectally 2 (two) times daily as needed for hemorrhoids. Do not use for more than 1 week at a time. 12/31/15  Yes Francesca Oman, DO  ?iron polysaccharides (NIFEREX) 150 MG capsule Take 1 capsule (150 mg total) by mouth daily. ?Patient taking differently: Take 150 mg by mouth every other day. 12/20/21 03/20/22 Yes [name], MD  ?omeprazole (PRILOSEC) 20 MG capsule TAKE 1  CAPSULE BY MOUTH EVERY DAY ?Patient taking differently: 20 mg every morning. 06/18/21  Yes Katsadouros, Vasilios, MD  ?polyethylene glycol powder (GAVILAX) 17 GM/SCOOP powder TAKE 17 G BY MOUTH DAILY AS NEEDED FOR MILD CONSTIPATION. ?Patient taking differently: Take 17 g by mouth daily as needed (constipation). 08/29/21  Yes [name], MD  ?senna-docusate (SENOKOT-S) 8.6-50 MG tablet Take 1 tablet by mouth at bedtime. ?Patient taking differently: Take 1 tablet by mouth at bedtime as needed (constipation). 03/21/21  Yes [name], MD  ?triamterene-hydrochlorothiazide (DYAZIDE) 37.5-25 MG capsule TAKE 1 CAPSULE BY MOUTH EVERY DAY ?Patient taking differently: Take 1 capsule by mouth every morning. 11/14/21  Yes Katsadouros, Vasilios, MD  ?pravastatin (PRAVACHOL) 40 MG tablet Take 1 tablet (40 mg total) by mouth daily. ?Patient not taking: Reported on 03/19/2022 09/27/20   [name], MD  ?   ? ?Allergies    ?Chlorpheniramine-dm and Robitussin dm [dextromethorphan-guaifenesin]   ? ?Review of Systems   ?Review of Systems  ?Gastrointestinal:  Positive for abdominal pain.  ? ?Physical Exam ?Updated Vital Signs ?BP 114/73 (BP Location: Right Arm)   Pulse 80   Temp 98 ?F (36.7 ?C) (Oral)   Resp 18   SpO2 97%  ?Physical Exam ?Vitals and nursing note reviewed.  ?Constitutional:   ?   Appearance: He is well-developed.  ?HENT:  ?   Head: Normocephalic and atraumatic.  ?Cardiovascular:  ?   Rate and Rhythm: Normal rate.  ?Pulmonary:  ?  Effort: Pulmonary effort is normal. No respiratory distress.  ?Abdominal:  ?   General: Bowel sounds are decreased. There is distension.  ?   Tenderness: There is abdominal tenderness. There is no rebound.  ?Musculoskeletal:     ?   General: Normal range of motion.  ?   Cervical back: Normal range of motion.  ?Neurological:  ?   Mental Status: He is alert.  ? ? ?ED Results / Procedures / Treatments   ?Labs ?(all labs ordered are listed, but only abnormal results are displayed) ?Labs  Reviewed  ?CBC WITH DIFFERENTIAL/PLATELET - Abnormal; Notable for the following components:  ?    Result Value  ? Hemoglobin 11.6 (*)   ? MCV 72.7 (*)   ? MCH 21.5 (*)   ? MCHC 29.6 (*)   ? RDW 18.0 (*)   ? Monocytes Absolute 1.2 (*)   ? Abs Immature Granulocytes 0.08 (*)   ? All other components within normal limits  ?COMPREHENSIVE METABOLIC PANEL - Abnormal; Notable for the following components:  ? Potassium 3.0 (*)   ? CO2 21 (*)   ? Glucose, Bld 195 (*)   ? Creatinine, Ser 1.27 (*)   ? Total Protein 8.3 (*)   ? All other components within normal limits  ?LACTIC ACID, PLASMA - Abnormal; Notable for the following components:  ? Lactic Acid, Venous 2.6 (*)   ? All other components within normal limits  ?LACTIC ACID, PLASMA - Abnormal; Notable for the following components:  ? Lactic Acid, Venous 2.3 (*)   ? All other components within normal limits  ?IRON AND TIBC - Abnormal; Notable for the following components:  ? Iron 32 (*)   ? TIBC 451 (*)   ? Saturation Ratios 7 (*)   ? All other components within normal limits  ?FERRITIN - Abnormal; Notable for the following components:  ? Ferritin 22 (*)   ? All other components within normal limits  ?HEMOGLOBIN A1C - Abnormal; Notable for the following components:  ? Hgb A1c MFr Bld 6.4 (*)   ? All other components within normal limits  ?POTASSIUM - Abnormal; Notable for the following components:  ? Potassium 3.4 (*)   ? All other components within normal limits  ?BASIC METABOLIC PANEL - Abnormal; Notable for the following components:  ? Potassium 3.3 (*)   ? Glucose, Bld 106 (*)   ? Calcium 8.0 (*)   ? All other components within normal limits  ?CBC - Abnormal; Notable for the following components:  ? Hemoglobin 9.3 (*)   ? HCT 30.5 (*)   ? MCV 71.3 (*)   ? MCH 21.7 (*)   ? RDW 17.1 (*)   ? All other components within normal limits  ?GLUCOSE, CAPILLARY - Abnormal; Notable for the following components:  ? Glucose-Capillary 134 (*)   ? All other components within normal  limits  ?GLUCOSE, CAPILLARY - Abnormal; Notable for the following components:  ? Glucose-Capillary 118 (*)   ? All other components within normal limits  ?GLUCOSE, CAPILLARY - Abnormal; Notable for the following components:  ? Glucose-Capillary 113 (*)   ? All other components within normal limits  ?GLUCOSE, CAPILLARY - Abnormal; Notable for the following components:  ? Glucose-Capillary 105 (*)   ? All other components within normal limits  ?GASTRIC OCCULT BLOOD (1-CARD TO LAB) - Abnormal; Notable for the following components:  ? Occult Blood, Gastric POSITIVE (*)   ? All other components within normal limits  ?CBG MONITORING, ED -  Abnormal; Notable for the following components:  ? Glucose-Capillary 139 (*)   ? All other components within normal limits  ?CBG MONITORING, ED - Abnormal; Notable for the following components:  ? Glucose-Capillary 116 (*)   ? All other components within normal limits  ?CBG MONITORING, ED - Abnormal; Notable for the following components:  ? Glucose-Capillary 114 (*)   ? All other components within normal limits  ?HIV ANTIBODY (ROUTINE TESTING W REFLEX)  ?MAGNESIUM  ?PHOSPHORUS  ?TSH  ?LACTIC ACID, PLASMA  ?GASTRIC OCCULT BLOOD (1-CARD TO LAB)  ?GASTRIC OCCULT BLOOD (1-CARD TO LAB)  ?TYPE AND SCREEN  ?ABO/RH  ? ? ?EKG ?None ? ?Radiology ?CT ABDOMEN PELVIS W CONTRAST ? ?Addendum Date: 03/19/2022   ?ADDENDUM REPORT: 03/19/2022 03:06 ADDENDUM: These results were called by telephone at the time of interpretation on 03/19/2022 at 2:56 am to provider [facility] , who verbally acknowledged these results. Electronically Signed   By: Fidela Salisbury M.D.   On: 03/19/2022 03:06  ? ?Result Date: 03/19/2022 ?CLINICAL DATA:  Abdominal pain, acute, nonlocalized. Small-bowel obstruction. EXAM: CT ABDOMEN AND PELVIS WITH CONTRAST TECHNIQUE: Multidetector CT imaging of the abdomen and pelvis was performed using the standard protocol following bolus administration of intravenous contrast. RADIATION DOSE  REDUCTION: This exam was performed according to the departmental dose-optimization program which includes automated exposure control, adjustment of the mA and/or kV according to patient size and/or use of iterativ

## 2022-03-20 NOTE — Progress Notes (Signed)
? ?Progress Note ? ?   ?Subjective: ?He is feeling better. Some mild nausea still but no emesis and abdominal pain is resolved. He is passing some gas but no BM ? ?Sister and mother are bedside ? ?Objective: ?Vital signs in last 24 hours: ?Temp:  [98 ?F (36.7 ?C)] 98 ?F (36.7 ?C) (04/19 0645) ?Pulse Rate:  [80-106] 88 (04/19 0645) ?Resp:  [13-21] 18 (04/19 0645) ?BP: (105-146)/(66-93) 136/80 (04/19 0645) ?SpO2:  [79 %-100 %] 100 % (04/19 0645) ?Last BM Date : 03/15/22 ? ?Intake/Output from previous day: ?04/18 0701 - 04/19 0700 ?In: 3307.6 [I.V.:3107.6; IV Piggyback:200] ?Out: 800 [Emesis/NG output:800] ?Intake/Output this shift: ?No intake/output data recorded. ? ?PE: ?General: pleasant, WD, male who is laying in bed in NAD ?Lungs:  Respiratory effort nonlabored on supplemental O2 via [place] ?Abd: soft, NT, ND, +BS. NGT in place with bilious output in tubing ?MSK: all 4 extremities are symmetrical with no cyanosis, clubbing, or edema. ?Skin: warm and dry  ?Psych: A&Ox3 with an appropriate affect.  ? ? ?Lab Results:  ?Recent Labs  ?  03/19/22 ?3810 03/20/22 ?0130  ?WBC 9.9 9.7  ?HGB 11.6* 9.3*  ?HCT 39.2 30.5*  ?PLT 291 217  ? ?BMET ?Recent Labs  ?  03/19/22 ?1751 03/19/22 ?2033 03/20/22 ?0130  ?NA 136  --  137  ?K 3.0* 3.4* 3.3*  ?CL 102  --  102  ?CO2 21*  --  29  ?GLUCOSE 195*  --  106*  ?BUN 22  --  12  ?CREATININE 1.27*  --  0.85  ?CALCIUM 8.9  --  8.0*  ? ?PT/INR ?No results for input(s): LABPROT, INR in the last 72 hours. ?CMP  ?   ?Component Value Date/Time  ? NA 137 03/20/2022 0130  ? NA 141 08/29/2021 1015  ? K 3.3 (L) 03/20/2022 0130  ? CL 102 03/20/2022 0130  ? CO2 29 03/20/2022 0130  ? GLUCOSE 106 (H) 03/20/2022 0130  ? BUN 12 03/20/2022 0130  ? BUN 9 08/29/2021 1015  ? CREATININE 0.85 03/20/2022 0130  ? CREATININE 0.86 06/08/2015 1220  ? CALCIUM 8.0 (L) 03/20/2022 0130  ? PROT 8.3 (H) 03/19/2022 0026  ? PROT 8.1 05/06/2016 1147  ? ALBUMIN 4.0 03/19/2022 0026  ? ALBUMIN 4.6 05/06/2016 1147  ? AST 32  03/19/2022 0026  ? ALT 30 03/19/2022 0026  ? ALKPHOS 60 03/19/2022 0026  ? BILITOT 0.5 03/19/2022 0026  ? BILITOT 0.2 05/06/2016 1147  ? GFRNONAA >60 03/20/2022 0130  ? [facility] >89 06/08/2015 1220  ? GFRAA 91 07/20/2020 0955  ? GFRAA >89 06/08/2015 1220  ? ?Lipase  ?   ?Component Value Date/Time  ? LIPASE 27 05/16/2017 1055  ? ? ? ? ? ?Studies/Results: ?CT ABDOMEN PELVIS W CONTRAST ? ?Addendum Date: 03/19/2022   ?ADDENDUM REPORT: 03/19/2022 03:06 ADDENDUM: These results were called by telephone at the time of interpretation on 03/19/2022 at 2:56 am to provider [facility] , who verbally acknowledged these results. Electronically Signed   By: Fidela Salisbury M.D.   On: 03/19/2022 03:06  ? ?Result Date: 03/19/2022 ?CLINICAL DATA:  Abdominal pain, acute, nonlocalized. Small-bowel obstruction. EXAM: CT ABDOMEN AND PELVIS WITH CONTRAST TECHNIQUE: Multidetector CT imaging of the abdomen and pelvis was performed using the standard protocol following bolus administration of intravenous contrast. RADIATION DOSE REDUCTION: This exam was performed according to the departmental dose-optimization program which includes automated exposure control, adjustment of the mA and/or kV according to patient size and/or use of iterative reconstruction technique.  CONTRAST:  133m OMNIPAQUE IOHEXOL 300 MG/ML  SOLN COMPARISON:  None. FINDINGS: Lower chest: Mild bibasilar atelectasis. Cardiac size is within normal limits. Small fluid within the distal esophagus may relate to gastroesophageal reflux. Hepatobiliary: There is peripheral portal venous gas within the non dependent left hepatic lobe. No enhancing intrahepatic mass. No intra or extrahepatic biliary ductal dilation. Gallbladder unremarkable. Pancreas: Unremarkable Spleen: Unremarkable Adrenals/Urinary Tract: The adrenal glands are unremarkable. Multiple simple renal cortical and parapelvic cysts are seen bilaterally. No follow-up imaging is recommended for these lesions. No enhancing  intra cortical lesion is identified. No hydronephrosis. No intrarenal or ureteral calculi. The bladder is unremarkable. Stomach/Bowel: There is pneumatosis involving the gastric fundus with submucosal gas identified non dependently. There is gas noted within several mesenteric venous branches along the greater curvature of the stomach, axial image # 14/3 and 15/3. No free intraperitoneal gas. A a mid to distal small bowel obstruction is present with a single point of transition identified within the right lower quadrant anteriorly at axial image # 67/3 and coronal image # 53/6. The proximal small bowel is dilated and fluid-filled. Distally, the small and large bowel are decompressed. The appendix is absent. No free intraperitoneal fluid. Vascular/Lymphatic: Aortic atherosclerosis. No enlarged abdominal or pelvic lymph nodes. Reproductive: Mild prostatic enlargement. Other: Small fat containing umbilical hernia. Musculoskeletal: No acute bone abnormality. No lytic or blastic bone lesion. IMPRESSION: Mid to distal small bowel obstruction. Pneumatosis of the gastric fundus. Mesenteric and portal venous gas. No free intraperitoneal gas. Fluid distension of the stomach and proximal small bowel. Fluid within the distal esophagus may relate to changes of gastroesophageal reflux. Aortic Atherosclerosis (ICD10-I70.0). Electronically Signed: By: AFidela SalisburyM.D. On: 03/19/2022 02:51  ? ?DG Abd 2 Views ? ?Result Date: 03/18/2022 ?CLINICAL DATA:  Vomiting. EXAM: ABDOMEN - 2 VIEW COMPARISON:  None. FINDINGS: Upright and supine views. The upright view excludes the upper abdomen. Demonstrates no free intraperitoneal air. Multiple small bowel air-fluid levels. The supine view demonstrates gaseous distension of small bowel loops of up to 5.1 cm. The upper abdomen is excluded. Paucity of colonic gas. No abnormal abdominal calcifications.   No appendicolith. IMPRESSION: Dilated small bowel with air-fluid levels. Favor small-bowel  obstruction over adynamic ileus. Electronically Signed   By: KAbigail MiyamotoM.D.   On: 03/18/2022 16:48  ? ?DG INTRO LONG GI TUBE ? ?Result Date: 03/19/2022 ?CLINICAL DATA:  Small-bowel obstruction, unsuccessful placement at bedside. EXAM: FL NG TUBE PLACEMENT CONTRAST:  None FLUOROSCOPY: FLUOROSCOPY Fluoroscopy time: 42 seconds Radiation Exposure Index (if provided by the fluoroscopic device): 1.74 mGy COMPARISON:  CT abdomen/pelvis 03/19/2022. FINDINGS: A 12 French nasogastric tube was placed with the assistance of a Glidewire through the left nare (after lidocaine prep) and passed to the level of the gastric body under fluoroscopic guidance. Refluxed gastric contents were observed within the tube. The procedure was performed by HPasty Spillers PA-C. IMPRESSION: Fluoroscopically-guided placement of a [place] S[place] style nasogastric tube. At procedure termination, the tube terminates within the gastric body. Electronically Signed   By: KKellie SimmeringD.O.   On: 03/19/2022 18:06   ? ?Anti-infectives: ?Anti-infectives (From admission, onward)  ? ? None  ? ?  ? ? ? ?Assessment/Plan ?SBO ?CT with gastric pneumatosis ?No prior abdominal surgeries ?- CT w/ Mid to distal small bowel obstruction. Pneumatosis of the gastric fundus. Mesenteric and portal venous gas. ?-On exam patient has minimal tenderness to palpation and abdomen is soft.  Vital signs are stable and he appears very comfortable.  Do not think he has acute abdomen/peritonitis currently and no indication for emergency surgery ?- passing flatus. No BM. NGT output high with 800 ml since placement yesterday evening  ?- continue NGT LIWS and start protocol today ?- Keep K > 4 and Mg > 2 for bowel function ?- Mobilize for bowel function ?- Hopefully patient will improve with conservative management. If patient fails to improve with conservative management or should he develop signs/symptoms of worsening obstruction/bowel compromise he may require exploratory surgery  during admission ? ?FEN: NGT LIWS, NPO, LR '@125ml'$ /hr, IV K ?ID: none indicated ?VTE: lovenox ? ?I reviewed ED provider notes, hospitalist notes, last 24 h vitals and pain scores, last 48 h intake and output, last 24

## 2022-03-21 ENCOUNTER — Inpatient Hospital Stay (HOSPITAL_COMMUNITY): Payer: Medicare Other

## 2022-03-21 DIAGNOSIS — K56609 Unspecified intestinal obstruction, unspecified as to partial versus complete obstruction: Secondary | ICD-10-CM | POA: Diagnosis not present

## 2022-03-21 LAB — GLUCOSE, CAPILLARY
Glucose-Capillary: 102 mg/dL — ABNORMAL HIGH (ref 70–99)
Glucose-Capillary: 102 mg/dL — ABNORMAL HIGH (ref 70–99)
Glucose-Capillary: 116 mg/dL — ABNORMAL HIGH (ref 70–99)
Glucose-Capillary: 74 mg/dL (ref 70–99)
Glucose-Capillary: 80 mg/dL (ref 70–99)
Glucose-Capillary: 93 mg/dL (ref 70–99)

## 2022-03-21 LAB — BASIC METABOLIC PANEL
Anion gap: 8 (ref 5–15)
BUN: 12 mg/dL (ref 8–23)
CO2: 27 mmol/L (ref 22–32)
Calcium: 8.3 mg/dL — ABNORMAL LOW (ref 8.9–10.3)
Chloride: 106 mmol/L (ref 98–111)
Creatinine, Ser: 0.86 mg/dL (ref 0.61–1.24)
GFR, Estimated: >60 mL/min (ref >60)
Glucose, Bld: 81 mg/dL (ref 70–99)
Potassium: 3.3 mmol/L — ABNORMAL LOW (ref 3.5–5.1)
Sodium: 141 mmol/L (ref 135–145)

## 2022-03-21 LAB — CBC WITH DIFFERENTIAL/PLATELET
Abs Immature Granulocytes: 0.11 10*3/uL — ABNORMAL HIGH (ref 0.00–0.07)
Basophils Absolute: 0 10*3/uL (ref 0.0–0.1)
Basophils Relative: 0 %
Eosinophils Absolute: 0 10*3/uL (ref 0.0–0.5)
Eosinophils Relative: 0 %
HCT: 30.2 % — ABNORMAL LOW (ref 39.0–52.0)
Hemoglobin: 9.3 g/dL — ABNORMAL LOW (ref 13.0–17.0)
Immature Granulocytes: 1 %
Lymphocytes Relative: 24 %
Lymphs Abs: 2 10*3/uL (ref 0.7–4.0)
MCH: 21.9 pg — ABNORMAL LOW (ref 26.0–34.0)
MCHC: 30.8 g/dL (ref 30.0–36.0)
MCV: 71.2 fL — ABNORMAL LOW (ref 80.0–100.0)
Monocytes Absolute: 1.3 10*3/uL — ABNORMAL HIGH (ref 0.1–1.0)
Monocytes Relative: 15 %
Neutro Abs: 5 10*3/uL (ref 1.7–7.7)
Neutrophils Relative %: 60 %
Platelets: 210 10*3/uL (ref 150–400)
RBC: 4.24 MIL/uL (ref 4.22–5.81)
RDW: 17.1 % — ABNORMAL HIGH (ref 11.5–15.5)
WBC: 8.5 10*3/uL (ref 4.0–10.5)
nRBC: 0 % (ref 0.0–0.2)

## 2022-03-21 MED ORDER — DOCUSATE SODIUM 100 MG PO CAPS
100.0000 mg | ORAL_CAPSULE | Freq: Two times a day (BID) | ORAL | Status: DC
Start: 2022-03-21 — End: 2022-03-24
  Administered 2022-03-21 – 2022-03-24 (×6): 100 mg via ORAL
  Filled 2022-03-21 (×7): qty 1

## 2022-03-21 MED ORDER — DEXTROSE-NACL 5-0.45 % IV SOLN
INTRAVENOUS | Status: DC
Start: 2022-03-21 — End: 2022-03-23

## 2022-03-21 MED ORDER — SENNA 8.6 MG PO TABS
1.0000 | ORAL_TABLET | Freq: Every day | ORAL | Status: DC
  Administered 2022-03-21 – 2022-03-24 (×4): 8.6 mg via ORAL
  Filled 2022-03-21 (×4): qty 1

## 2022-03-21 MED ORDER — POTASSIUM CHLORIDE 10 MEQ/100ML IV SOLN
10.0000 meq | INTRAVENOUS | Status: AC
  Administered 2022-03-21 (×2): 10 meq via INTRAVENOUS
  Filled 2022-03-21 (×2): qty 100

## 2022-03-21 MED ORDER — POLYETHYLENE GLYCOL 3350 17 G PO PACK
17.0000 g | PACK | Freq: Two times a day (BID) | ORAL | Status: DC
  Administered 2022-03-21 – 2022-03-24 (×6): 17 g via ORAL
  Filled 2022-03-21 (×7): qty 1

## 2022-03-21 NOTE — Progress Notes (Addendum)
? ?  Subjective: Patient seen and evaluated bedside.  He feels well.  Family reports that he has not had any more vomiting.  Patient denies nausea.  He has been able to pass flatus, but no bowel movement as of yet.  Not having any pain. ? ?Objective: NG tube fell out overnight ? ?Vital signs in last 24 hours: ?Vitals:  ? 03/20/22 2031 03/21/22 0526 03/21/22 0849 03/21/22 0853  ?BP: (!) 141/88 (!) 124/96  128/74  ?Pulse: 77 69 67 65  ?Resp: '17 16  18  '$ ?Temp: 97.7 ?F (36.5 ?C) 97.8 ?F (36.6 ?C)  97.8 ?F (36.6 ?C)  ?TempSrc: Oral Oral  Oral  ?SpO2: 99% 100% 100% 99%  ? ?Physical Exam ?General: alert, appears stated age, in no acute distress ?HEENT: Normocephalic, atraumatic, EOM intact, conjunctiva normal ?CV: Regular rate and rhythm, no murmurs rubs or gallops ?Pulm: Clear to auscultation bilaterally, normal work of breathing ?Abdomen: Soft, mild distention, bowel sounds present, no tenderness to palpation ?MSK: No lower extremity edema ?Skin: Warm and dry ?Neuro: Alert and oriented x3  ? ?Assessment/Plan: ? ?Principal Problem: ?  Small bowel obstruction ([place]) ?Active Problems: ?  Iron deficiency anemia due to chronic blood loss ?  Pneumatosis of intestines ?  Acute kidney injury ([place]) ? ?Leonard Tucker is a 66 y.o. with a pertinent PMH of hearing impairment, iron deficiency anemia who presented with vomiting and admitted for small bowel obstruction.  ? ?SBO ?Gastric pneumatosis ?- CT with pneumatosis of the gastric fundus and mesenteric and portal venous gas.  Given patient's lack of apparent pain feel these findings are likely related to his vomiting and not ischemic bowel. ?- Patient had NG tube placed via fluoroscopy scopic guidance with approximately 1500 cc out to suction.  Unfortunately tube fell out yesterday.  No plan to replace tube.  Gastrografin was able to be injected.  Fortunately patient appears to be making slow improvements daily.  He appears very comfortable on exam.  Abdomen is soft, nontender with  positive bowel sounds.  He is not having any current nausea or vomiting.  He is passing flatus, however has not had a bowel movement yet. ?-General surgery following, appreciate recommendations ?- Repeat plain films showed some improvement in small bowel dilation. ?-Surgery planning to start clear liquid diet and bowel regimen. ?- Recommended patient mobilize today as much as possible. ?- Hopefully this continues to resolve with conservative management. ?- We have started patient on D5 half-normal saline to replace insensible losses.  This can be discontinued if patient is able to tolerate p.o. intake. ? ?Iron deficiency anemia ?Patient is on Lovenox for VTE prophylaxis; hemoglobin has remained stable.  If hemoglobin continues to downtrend will hold this.  Will likely need to be further evaluated with colonoscopy outpatient. ?-Trend CBC ?-Discontinue Lovenox if worsening hematemesis ? ?Hypokalemia ?Potassium 3.3 this morning, replating with IV K. ?-Trend BMP ? ?AKI, resolved ?Resolved. ? ?Prior to Admission Living Arrangement: Home ?Anticipated Discharge Location: Home ?Barriers to Discharge: Clinical improvement ?Dispo: Anticipated discharge in approximately 2-3 day(s).  ? ?Delene Ruffini, MD ?03/21/2022, 11:48 AM ?Pager: [phone] ?After 5pm on weekdays and 1pm on weekends: On Call pager [phone]  ?

## 2022-03-21 NOTE — Progress Notes (Signed)
? ?Progress Note ? ?   ?Subjective: ?NAEON. NGT came out yesterday afternoon and we opted not to replace. He had not received gastrograffin prior to this. He continues to deny abdominal pain and nausea. No emesis. Passing flatus. No BM ? ?Sister and mother are bedside ? ?Objective: ?Vital signs in last 24 hours: ?Temp:  [97.7 ?F (36.5 ?C)-98.2 ?F (36.8 ?C)] 97.8 ?F (36.6 ?C) (04/20 3557) ?Pulse Rate:  [65-77] 65 (04/20 0853) ?Resp:  [16-18] 18 (04/20 0853) ?BP: (124-141)/(74-96) 128/74 (04/20 3220) ?SpO2:  [99 %-100 %] 99 % (04/20 0853) ?Last BM Date : 03/15/22 ? ?Intake/Output from previous day: ?04/19 0701 - 04/20 0700 ?In: 1720.6 [I.V.:1520.6; IV Piggyback:200] ?Out: 700 [Urine:700] ?Intake/Output this shift: ?No intake/output data recorded. ? ?PE: ?General: pleasant, WD, male who is laying in bed in NAD ?Lungs:  Respiratory effort nonlabored on supplemental O2 via [place] ?Abd: soft, NT, ND, +BS.  ?MSK: all 4 extremities are symmetrical with no cyanosis, clubbing, or edema. ?Skin: warm and dry  ?Psych: A&Ox3 with an appropriate affect.  ? ? ?Lab Results:  ?Recent Labs  ?  03/20/22 ?0130 03/21/22 ?0234  ?WBC 9.7 8.5  ?HGB 9.3* 9.3*  ?HCT 30.5* 30.2*  ?PLT 217 210  ? ? ?BMET ?Recent Labs  ?  03/20/22 ?0130 03/21/22 ?0234  ?NA 137 141  ?K 3.3* 3.3*  ?CL 102 106  ?CO2 29 27  ?GLUCOSE 106* 81  ?BUN 12 12  ?CREATININE 0.85 0.86  ?CALCIUM 8.0* 8.3*  ? ? ?PT/INR ?No results for input(s): LABPROT, INR in the last 72 hours. ?CMP  ?   ?Component Value Date/Time  ? NA 141 03/21/2022 0234  ? NA 141 08/29/2021 1015  ? K 3.3 (L) 03/21/2022 0234  ? CL 106 03/21/2022 0234  ? CO2 27 03/21/2022 0234  ? GLUCOSE 81 03/21/2022 0234  ? BUN 12 03/21/2022 0234  ? BUN 9 08/29/2021 1015  ? CREATININE 0.86 03/21/2022 0234  ? CREATININE 0.86 06/08/2015 1220  ? CALCIUM 8.3 (L) 03/21/2022 0234  ? PROT 8.3 (H) 03/19/2022 0026  ? PROT 8.1 05/06/2016 1147  ? ALBUMIN 4.0 03/19/2022 0026  ? ALBUMIN 4.6 05/06/2016 1147  ? AST 32 03/19/2022 0026  ? ALT  30 03/19/2022 0026  ? ALKPHOS 60 03/19/2022 0026  ? BILITOT 0.5 03/19/2022 0026  ? BILITOT 0.2 05/06/2016 1147  ? GFRNONAA >60 03/21/2022 0234  ? [facility] >89 06/08/2015 1220  ? GFRAA 91 07/20/2020 0955  ? GFRAA >89 06/08/2015 1220  ? ?Lipase  ?   ?Component Value Date/Time  ? LIPASE 27 05/16/2017 1055  ? ? ? ? ? ?Studies/Results: ?DG Abd Portable 1V ? ?Result Date: 03/21/2022 ?CLINICAL DATA:  Small-bowel obstruction EXAM: PORTABLE ABDOMEN - 1 VIEW COMPARISON:  Previous radiograph done on 03/18/2022 and CT done on 03/19/2022 FINDINGS: There is interval decrease in degree of small bowel dilation. There are dilated small bowel loops in the left mid abdomen measuring up to 5 cm in diameter. Gas is present in colon. Left hemidiaphragm is elevated. Linear densities seen in the left lower lung fields suggesting subsegmental atelectasis. There is possible decrease in height of body of T9 vertebra. IMPRESSION: There is decrease in degree of small bowel dilation. Still, there is moderate to marked distention of small-bowel loops in the left mid abdomen measuring up to 5 cm in diameter. There are linear densities in the left lower lung fields suggesting subsegmental atelectasis. There is possible decrease in height of body of T9 vertebra. Electronically Signed  By: Elmer Picker M.D.   On: 03/21/2022 08:17  ? ?DG INTRO LONG GI TUBE ? ?Result Date: 03/19/2022 ?CLINICAL DATA:  Small-bowel obstruction, unsuccessful placement at bedside. EXAM: FL NG TUBE PLACEMENT CONTRAST:  None FLUOROSCOPY: FLUOROSCOPY Fluoroscopy time: 42 seconds Radiation Exposure Index (if provided by the fluoroscopic device): 1.74 mGy COMPARISON:  CT abdomen/pelvis 03/19/2022. FINDINGS: A 12 French nasogastric tube was placed with the assistance of a Glidewire through the left nare (after lidocaine prep) and passed to the level of the gastric body under fluoroscopic guidance. Refluxed gastric contents were observed within the tube. The procedure was  performed by Pasty Spillers, PA-C. IMPRESSION: Fluoroscopically-guided placement of a [place] Sump style nasogastric tube. At procedure termination, the tube terminates within the gastric body. Electronically Signed   By: Kellie Simmering D.O.   On: 03/19/2022 18:06   ? ?Anti-infectives: ?Anti-infectives (From admission, onward)  ? ? None  ? ?  ? ? ? ?Assessment/Plan ?SBO ?CT with gastric pneumatosis ?No prior abdominal surgeries ?- CT w/ Mid to distal small bowel obstruction. Pneumatosis of the gastric fundus. Mesenteric and portal venous gas. ?-On exam patient has minimal tenderness to palpation and abdomen is soft.  Vital signs are stable and he appears very comfortable.  Do not think he has acute abdomen/peritonitis currently and no indication for emergency surgery ?- passing flatus. No BM. ?- abd xray still with some SB dilatation but improved ?- start clear liquid diet and bowel regiment ?- Keep K > 4 and Mg > 2 for bowel function ?- Mobilize for bowel function ?- Hopefully patient will improve with conservative management. If patient fails to improve with conservative management or should he develop signs/symptoms of worsening obstruction/bowel compromise he may require exploratory surgery during admission ? ?FEN: CLD, LR '@75ml'$ /hr, IV K ?ID: none indicated ?VTE: lovenox ? ?I reviewed hospitalist notes, last 24 h vitals and pain scores, last 48 h intake and output, last 24 h labs and trends, and last 24 h imaging results. ? ?An ASL interpreter was present via video service during the history-taking and subsequent discussion (and for part of the physical exam) with this patient. ? ? ? LOS: 2 days  ? ?Winferd Humphrey, PA-C ?[facility] ?03/21/2022, 12:22 PM ?Please see Amion for pager number during day hours 7:00am-4:30pm ? ?

## 2022-03-21 NOTE — Progress Notes (Signed)
Mobility Specialist Progress Note  ? ? 03/21/22 1032  ?Mobility  ?Activity Ambulated with assistance in hallway  ?Level of Assistance Contact guard assist, steadying assist  ?Assistive Device Front wheel walker  ?Distance Ambulated (ft) 280 ft ?(140+140)  ?Activity Response Tolerated well  ?$Mobility charge 1 Mobility  ? ?Pt received in bed and agreeable. Ambulated on 2LO2. Took x1 seated rest break. Returned to bed with family present.  ? ?Utilized iPad ASL interpreter during session.  ? ?Leonard Tucker ?Mobility Specialist  ?Primary: 5N M.S. Phone: [phone] ?Secondary: 6N M.S. Phone: [phone] ?  ?

## 2022-03-21 NOTE — Plan of Care (Signed)

## 2022-03-21 NOTE — Progress Notes (Signed)
Mobility Specialist: Progress Note ? ? 03/21/22 1617  ?Mobility  ?Activity Ambulated with assistance in hallway  ?Level of Assistance Standby assist, set-up cues, supervision of patient - no hands on  ?Assistive Device Front wheel walker  ?Distance Ambulated (ft) 280 ft ?(140'x2)  ?Activity Response Tolerated well  ?$Mobility charge 1 Mobility  ? ?Post-Mobility: 66 HR, 100% SpO2 ? ?Pt received in bed and agreeable to ambulation. Ambulated on RA, no c/o throughout. Stopped x1 for seated break d/t general fatigue. Visual cues required for RW proximity, management, and safety. Pt back to bed after session with call bell in reach and family present in the room.  ? ?Leonard Tucker Leonard Tucker ?Mobility Specialist ?Mobility Specialist [place]: [phone] ?Mobility Specialist [place]: [phone] ? ?

## 2022-03-22 LAB — CBC
HCT: 30.5 % — ABNORMAL LOW (ref 39.0–52.0)
Hemoglobin: 9 g/dL — ABNORMAL LOW (ref 13.0–17.0)
MCH: 21.3 pg — ABNORMAL LOW (ref 26.0–34.0)
MCHC: 29.5 g/dL — ABNORMAL LOW (ref 30.0–36.0)
MCV: 72.3 fL — ABNORMAL LOW (ref 80.0–100.0)
Platelets: 240 10*3/uL (ref 150–400)
RBC: 4.22 MIL/uL (ref 4.22–5.81)
RDW: 17.1 % — ABNORMAL HIGH (ref 11.5–15.5)
WBC: 8.5 10*3/uL (ref 4.0–10.5)
nRBC: 0 % (ref 0.0–0.2)

## 2022-03-22 LAB — BASIC METABOLIC PANEL
Anion gap: 6 (ref 5–15)
BUN: 7 mg/dL — ABNORMAL LOW (ref 8–23)
CO2: 26 mmol/L (ref 22–32)
Calcium: 8.2 mg/dL — ABNORMAL LOW (ref 8.9–10.3)
Chloride: 107 mmol/L (ref 98–111)
Creatinine, Ser: 0.91 mg/dL (ref 0.61–1.24)
GFR, Estimated: >60 mL/min (ref >60)
Glucose, Bld: 109 mg/dL — ABNORMAL HIGH (ref 70–99)
Potassium: 3.1 mmol/L — ABNORMAL LOW (ref 3.5–5.1)
Sodium: 139 mmol/L (ref 135–145)

## 2022-03-22 LAB — GLUCOSE, CAPILLARY
Glucose-Capillary: 115 mg/dL — ABNORMAL HIGH (ref 70–99)
Glucose-Capillary: 99 mg/dL (ref 70–99)
Glucose-Capillary: 97 mg/dL (ref 70–99)
Glucose-Capillary: 103 mg/dL — ABNORMAL HIGH (ref 70–99)
Glucose-Capillary: 145 mg/dL — ABNORMAL HIGH (ref 70–99)
Glucose-Capillary: 121 mg/dL — ABNORMAL HIGH (ref 70–99)

## 2022-03-22 MED ORDER — IRON DEXTRAN 50 MG/ML IJ SOLN
1000.0000 mg | Freq: Once | INTRAMUSCULAR | Status: AC
Start: 2022-03-22 — End: 2022-03-23
  Administered 2022-03-23: 1000 mg via INTRAVENOUS
  Filled 2022-03-22: qty 20

## 2022-03-22 MED ORDER — SODIUM CHLORIDE 0.9 % IV SOLN
25.0000 mg | Freq: Once | INTRAVENOUS | Status: AC
  Administered 2022-03-22: 25 mg via INTRAVENOUS
  Filled 2022-03-22 (×2): qty 0.5

## 2022-03-22 MED ORDER — POTASSIUM CHLORIDE 10 MEQ/100ML IV SOLN
10.0000 meq | INTRAVENOUS | Status: AC
  Administered 2022-03-22 (×4): 10 meq via INTRAVENOUS
  Filled 2022-03-22 (×4): qty 100

## 2022-03-22 MED ORDER — SODIUM CHLORIDE 0.9 % IV SOLN: 1000.0000 mg | Freq: Once | INTRAVENOUS | Status: DC

## 2022-03-22 NOTE — Progress Notes (Signed)
Mobility Specialist Progress Note: ? ? 03/22/22 1429  ?Mobility  ?Activity Ambulated with assistance to bathroom;Ambulated with assistance in room  ?Level of Assistance Standby assist, set-up cues, supervision of patient - no hands on  ?Assistive Device Front wheel walker  ?Distance Ambulated (ft) 30 ft  ?Activity Response Tolerated well  ?$Mobility charge 1 Mobility  ? ?Pt received in bed asking to go to bathroom. No complaints of pain. Ambulated to BR and was able to have a BM. Left in bed with call bell in reach and all needs met.  ? ?Krystian Ferrentino ?Mobility Specialist ?Primary Phone [phone] ? ?

## 2022-03-22 NOTE — Progress Notes (Signed)
? ?Progress Note ? ?   ?Subjective: ?NAEON. Tolerated clears well without nausea, emesis, abdominal pain. Has had at least 2 bowel movements.  ? ?mother is bedside ? ?Objective: ?Vital signs in last 24 hours: ?Temp:  [97.8 ?F (36.6 ?C)-98.4 ?F (36.9 ?C)] 98.4 ?F (36.9 ?C) (04/21 0325) ?Pulse Rate:  [65-73] 65 (04/21 0325) ?Resp:  [17-18] 18 (04/21 0325) ?BP: (127-143)/(74-94) 137/85 (04/21 0325) ?SpO2:  [99 %-100 %] 100 % (04/21 0325) ?Last BM Date : 03/15/22 ? ?Intake/Output from previous day: ?04/20 0701 - 04/21 0700 ?In: 1458.8 [P.O.:240; I.V.:1022.7; IV Piggyback:196.1] ?Out: 1700 [Urine:1700] ?Intake/Output this shift: ?No intake/output data recorded. ? ?PE: ?General: pleasant, WD, male who is laying in bed in NAD ?Lungs:  Respiratory effort nonlabored on room air ?Abd: soft, NT, ND, +BS.  ?MSK: all 4 extremities are symmetrical with no cyanosis, clubbing, or edema. ?Skin: warm and dry  ?Psych: A&Ox3 with an appropriate affect.  ? ? ?Lab Results:  ?Recent Labs  ?  03/21/22 ?0234 03/22/22 ?0116  ?WBC 8.5 8.5  ?HGB 9.3* 9.0*  ?HCT 30.2* 30.5*  ?PLT 210 240  ? ? ?BMET ?Recent Labs  ?  03/21/22 ?0234 03/22/22 ?0116  ?NA 141 139  ?K 3.3* 3.1*  ?CL 106 107  ?CO2 27 26  ?GLUCOSE 81 109*  ?BUN 12 7*  ?CREATININE 0.86 0.91  ?CALCIUM 8.3* 8.2*  ? ? ?PT/INR ?No results for input(s): LABPROT, INR in the last 72 hours. ?CMP  ?   ?Component Value Date/Time  ? NA 139 03/22/2022 0116  ? NA 141 08/29/2021 1015  ? K 3.1 (L) 03/22/2022 0116  ? CL 107 03/22/2022 0116  ? CO2 26 03/22/2022 0116  ? GLUCOSE 109 (H) 03/22/2022 0116  ? BUN 7 (L) 03/22/2022 0116  ? BUN 9 08/29/2021 1015  ? CREATININE 0.91 03/22/2022 0116  ? CREATININE 0.86 06/08/2015 1220  ? CALCIUM 8.2 (L) 03/22/2022 0116  ? PROT 8.3 (H) 03/19/2022 0026  ? PROT 8.1 05/06/2016 1147  ? ALBUMIN 4.0 03/19/2022 0026  ? ALBUMIN 4.6 05/06/2016 1147  ? AST 32 03/19/2022 0026  ? ALT 30 03/19/2022 0026  ? ALKPHOS 60 03/19/2022 0026  ? BILITOT 0.5 03/19/2022 0026  ? BILITOT 0.2  05/06/2016 1147  ? GFRNONAA >60 03/22/2022 0116  ? [facility] >89 06/08/2015 1220  ? GFRAA 91 07/20/2020 0955  ? GFRAA >89 06/08/2015 1220  ? ?Lipase  ?   ?Component Value Date/Time  ? LIPASE 27 05/16/2017 1055  ? ? ? ? ? ?Studies/Results: ?DG Abd Portable 1V ? ?Result Date: 03/21/2022 ?CLINICAL DATA:  Small-bowel obstruction EXAM: PORTABLE ABDOMEN - 1 VIEW COMPARISON:  Previous radiograph done on 03/18/2022 and CT done on 03/19/2022 FINDINGS: There is interval decrease in degree of small bowel dilation. There are dilated small bowel loops in the left mid abdomen measuring up to 5 cm in diameter. Gas is present in colon. Left hemidiaphragm is elevated. Linear densities seen in the left lower lung fields suggesting subsegmental atelectasis. There is possible decrease in height of body of T9 vertebra. IMPRESSION: There is decrease in degree of small bowel dilation. Still, there is moderate to marked distention of small-bowel loops in the left mid abdomen measuring up to 5 cm in diameter. There are linear densities in the left lower lung fields suggesting subsegmental atelectasis. There is possible decrease in height of body of T9 vertebra. Electronically Signed   By: Elmer Picker M.D.   On: 03/21/2022 08:17   ? ?Anti-infectives: ?Anti-infectives (From  admission, onward)  ? ? None  ? ?  ? ? ? ?Assessment/Plan ?SBO ?CT with gastric pneumatosis ?No prior abdominal surgeries ?- CT w/ Mid to distal small bowel obstruction. Pneumatosis of the gastric fundus. Mesenteric and portal venous gas. ?- no abdominal pain.  Vital signs are stable and he appears very comfortable.  Do not think he has acute abdomen/peritonitis and no indication for emergency surgery ?- tolerating clears and has had BM ?- continue bowel regimen ?- Keep K > 4 and Mg > 2 for bowel function ?- Mobilize for bowel function ?- appears to be improving with conservative management. Hopefully tolerates diet advancement and home soon ? ?FEN: FLD, LR '@75ml'$ /hr,  IV K ?ID: none indicated ?VTE: lovenox ? ?I reviewed hospitalist notes, last 24 h vitals and pain scores, last 48 h intake and output, and last 24 h labs and trends. ? ?An ASL interpreter was present via video service during the history-taking and subsequent discussion (and for part of the physical exam) with this patient. ? ? ? LOS: 3 days  ? ?Winferd Humphrey, PA-C ?[facility] ?03/22/2022, 7:33 AM ?Please see Amion for pager number during day hours 7:00am-4:30pm ? ?

## 2022-03-22 NOTE — Progress Notes (Addendum)
? ?  Subjective: Patient seen and evaluated bedside.  Discussed with family bedside stated that he had not been throwing up.  Had had multiple bowel movements overnight.  Patient again in the afternoon.  He was sitting up in bed eating lunch.  He denied nausea or pain.  Family at bedside reported he had had another bowel movement which appeared watery in nature. ? ?Objective: No acute events overnight. ? ?Vital signs in last 24 hours: ?Vitals:  ? 03/21/22 1720 03/21/22 2005 03/22/22 0325 03/22/22 6734  ?BP: 127/79 (!) 143/94 137/85 (!) 139/93  ?Pulse: 65 73 65 70  ?Resp: '18 17 18 19  '$ ?Temp: 97.9 ?F (36.6 ?C) 97.8 ?F (36.6 ?C) 98.4 ?F (36.9 ?C) 97.8 ?F (36.6 ?C)  ?TempSrc: Oral Oral Oral Oral  ?SpO2: 100% 100% 100% 99%  ? ?Physical Exam ?General: alert, appears stated age, in no acute distress ?HEENT: Normocephalic, atraumatic, EOM intact, conjunctiva normal ?CV: Regular rate and rhythm, no murmurs rubs or gallops ?Pulm: Clear to auscultation bilaterally, normal work of breathing ?Abdomen: Soft, mild distention, bowel sounds present, no tenderness to palpation ?MSK: No lower extremity edema ?Skin: Warm and dry ?Neuro: Alert and oriented x3  ? ?Assessment/Plan: ? ?Principal Problem: ?  Small bowel obstruction ([place]) ?Active Problems: ?  Iron deficiency anemia due to chronic blood loss ?  Pneumatosis of intestines ?  Acute kidney injury ([place]) ? ?ANDRON MARRAZZO is a 66 y.o. with a pertinent PMH of hearing impairment, iron deficiency anemia who presented with vomiting and admitted for small bowel obstruction.  ? ?SBO ?Gastric pneumatosis ?- CT with pneumatosis of the gastric fundus and mesenteric and portal venous gas.  Given patient's lack of apparent pain feel these findings are likely related to his vomiting and not ischemic bowel. ?-Patient had previously had NG tube placed via fluoroscopy.  Gastrografin was injected.  Since then NG tube has fallen out, however fortunately he has made improvements.  He had a bowel  movement yesterday and has had several overnight.  He is now eating liquid diet.  Abdominal exam is benign.  He has not had any further vomiting. ?General surgery following, appreciate their recommendations.  No plan for surgery at this time.  Overall he appears to be improving well. ?- Full liquid diet.  ?- Recommended patient mobilize today as much as possible. ?- Hopefully this continues to resolve with conservative management. ?- On D5 half-normal saline to replace insensible losses.  This can be discontinued if patient is able to tolerate p.o. intake.  We will see how he tolerates liquid diet lunch. ? ?Iron deficiency anemia ?We will start patient on IV iron.  Patient has low iron stores with a ferritin of 7.  Per Delbert Harness equation deficit approximately 780 to reach hemoglobin of 12.  Patient would likely be able to benefit from additional iron supplementation to improve hemoglobin beyond 12.  We will plan to give 1000 mg IV dextrose to improve iron stores. Would avoid p.o. given propensity for SBO and constipation. ? ?Hypokalemia ?Potassium 3.3 this morning, replating with IV K. ?-Trend BMP ? ?AKI, resolved ?Resolved. ? ?Prior to Admission Living Arrangement: Home ?Anticipated Discharge Location: Home ?Barriers to Discharge: Clinical improvement ?Dispo: Anticipated discharge in approximately 2-3 day(s).  ? ?Delene Ruffini, MD ?03/22/2022, 2:17 PM ?Pager: [phone] ?After 5pm on weekdays and 1pm on weekends: On Call pager [phone]  ?

## 2022-03-22 NOTE — Progress Notes (Signed)
Mobility Specialist Progress Note  ? ? 03/22/22 1634  ?Mobility  ?Activity Ambulated with assistance in hallway  ?Level of Assistance Contact guard assist, steadying assist  ?Assistive Device Front wheel walker  ?Distance Ambulated (ft) 400 ft  ?Activity Response Tolerated well  ?$Mobility charge 1 Mobility  ? ?Pt received on couch and agreeable. No complaints on walk. Took x1 seated rest break. Returned to couch with call bell in reach.   ? ?Hildred Alamin ?Mobility Specialist  ?Primary: 5N M.S. Phone: [phone] ?Secondary: 6N M.S. Phone: [phone] ?  ?

## 2022-03-23 LAB — CBC
HCT: 29.5 % — ABNORMAL LOW (ref 39.0–52.0)
Hemoglobin: 9 g/dL — ABNORMAL LOW (ref 13.0–17.0)
MCH: 21.8 pg — ABNORMAL LOW (ref 26.0–34.0)
MCHC: 30.5 g/dL (ref 30.0–36.0)
MCV: 71.6 fL — ABNORMAL LOW (ref 80.0–100.0)
Platelets: 231 10*3/uL (ref 150–400)
RBC: 4.12 MIL/uL — ABNORMAL LOW (ref 4.22–5.81)
RDW: 17.2 % — ABNORMAL HIGH (ref 11.5–15.5)
WBC: 9 10*3/uL (ref 4.0–10.5)
nRBC: 0 % (ref 0.0–0.2)

## 2022-03-23 LAB — GLUCOSE, CAPILLARY
Glucose-Capillary: 106 mg/dL — ABNORMAL HIGH (ref 70–99)
Glucose-Capillary: 106 mg/dL — ABNORMAL HIGH (ref 70–99)
Glucose-Capillary: 110 mg/dL — ABNORMAL HIGH (ref 70–99)
Glucose-Capillary: 111 mg/dL — ABNORMAL HIGH (ref 70–99)
Glucose-Capillary: 138 mg/dL — ABNORMAL HIGH (ref 70–99)
Glucose-Capillary: 90 mg/dL (ref 70–99)

## 2022-03-23 LAB — BASIC METABOLIC PANEL
Anion gap: 5 (ref 5–15)
BUN: 6 mg/dL — ABNORMAL LOW (ref 8–23)
CO2: 25 mmol/L (ref 22–32)
Calcium: 8.6 mg/dL — ABNORMAL LOW (ref 8.9–10.3)
Chloride: 110 mmol/L (ref 98–111)
Creatinine, Ser: 0.94 mg/dL (ref 0.61–1.24)
GFR, Estimated: >60 mL/min (ref >60)
Glucose, Bld: 106 mg/dL — ABNORMAL HIGH (ref 70–99)
Potassium: 3.4 mmol/L — ABNORMAL LOW (ref 3.5–5.1)
Sodium: 140 mmol/L (ref 135–145)

## 2022-03-23 LAB — MAGNESIUM: Magnesium: 2 mg/dL (ref 1.7–2.4)

## 2022-03-23 MED ORDER — POTASSIUM CHLORIDE 10 MEQ/100ML IV SOLN
10.0000 meq | INTRAVENOUS | Status: AC
  Administered 2022-03-23 (×4): 10 meq via INTRAVENOUS
  Filled 2022-03-23 (×4): qty 100

## 2022-03-23 NOTE — Plan of Care (Signed)
?  Problem: Clinical Measurements: ?Goal: Diagnostic test results will improve ?Outcome: Progressing ?Goal: Respiratory complications will improve ?Outcome: Progressing ?Goal: Cardiovascular complication will be avoided ?Outcome: Progressing ?  ?Problem: Nutrition: ?Goal: Adequate nutrition will be maintained ?Outcome: Progressing ?  ?Problem: Coping: ?Goal: Level of anxiety will decrease ?Outcome: Progressing ?  ?

## 2022-03-23 NOTE — Progress Notes (Signed)
? ? ?HD#4 ?Subjective:  ?Overnight Events: No acute events overnight ? ?No acute concerns at this time.  Patient endorses feeling hungry and would like to eat.  We will see how he does with lunch and discussed possible discharge if does well with family.  We discussed how we are on certain as to what caused his small bowel obstruction but that with his iron deficiency anemia he will need a colonoscopy moving forward.  We discussed starting with small frequent meals do not eat large portions at 1 time. ? ?Addendum 1600-received message from nurse that patient did well with his lunch and had no concerns.  We discussed discharge however prior to placing orders patient's family reached out to nurse and said that they would feel more comfortable with patient leaving tomorrow morning and to see how he does with his dinner and breakfast tomorrow.  They do not want to have to bring him back if something occurs at home. ? ?ASL interpreter used during this interaction ? ?Objective:  ?Vital signs in last 24 hours: ?Vitals:  ? 03/22/22 1557 03/22/22 2003 03/23/22 0417 03/23/22 0757  ?BP: (!) 148/88 (!) 142/93 123/77 (!) 144/84  ?Pulse: 77 70 62 64  ?Resp: '18 19 18 18  '$ ?Temp: 97.9 ?F (36.6 ?C) 98.4 ?F (36.9 ?C) 98.3 ?F (36.8 ?C) 97.8 ?F (36.6 ?C)  ?TempSrc: Oral Oral Oral Oral  ?SpO2: 100% 99% 100% 100%  ? ?Supplemental O2: Room Air ? ? ?Physical Exam:  ?Constitutional: No acute distress, pleasant ?Eyes: conjunctiva non-erythematous ?Neck: supple ?Pulmonary/Chest: normal work of breathing on room air ?Abdominal: soft, non-tender, non-distended ?MSK: normal bulk and tone ?Neurological: alert ?Skin: warm and dry ? ?There were no vitals filed for this visit. ? ? ?Intake/Output Summary (Last 24 hours) at 03/23/2022 1639 ?Last data filed at 03/23/2022 1500 ?Gross per 24 hour  ?Intake 1732.4 ml  ?Output 2250 ml  ?Net -517.6 ml  ? ?Net IO Since Admission: 2,799.44 mL [03/23/22 1639] ? ?Pertinent Labs: ? ?  Latest Ref Rng & Units  03/23/2022  ?  1:04 AM 03/22/2022  ?  1:16 AM 03/21/2022  ?  2:34 AM  ?CBC  ?WBC 4.0 - 10.5 K/uL 9.0   8.5   8.5    ?Hemoglobin 13.0 - 17.0 g/dL 9.0   9.0   9.3    ?Hematocrit 39.0 - 52.0 % 29.5   30.5   30.2    ?Platelets 150 - 400 K/uL 231   240   210    ? ? ? ?  Latest Ref Rng & Units 03/23/2022  ?  1:04 AM 03/22/2022  ?  1:16 AM 03/21/2022  ?  2:34 AM  ?CMP  ?Glucose 70 - 99 mg/dL 106   109   81    ?BUN 8 - 23 mg/dL '6   7   12    '$ ?Creatinine 0.61 - 1.24 mg/dL 0.94   0.91   0.86    ?Sodium 135 - 145 mmol/L 140   139   141    ?Potassium 3.5 - 5.1 mmol/L 3.4   3.1   3.3    ?Chloride 98 - 111 mmol/L 110   107   106    ?CO2 22 - 32 mmol/L '25   26   27    '$ ?Calcium 8.9 - 10.3 mg/dL 8.6   8.2   8.3    ? ? ?Imaging: ?No results found. ? ?Assessment/Plan:  ? ?Principal Problem: ?  Small bowel obstruction ([place]) ?  Active Problems: ?  Iron deficiency anemia due to chronic blood loss ?  Pneumatosis of intestines ?  Acute kidney injury ([place]) ? ?Patient Summary: ?Leonard Tucker is a 66 y.o. with a pertinent PMH of hearing impairment, iron deficiency anemia, who presented with vomiting and admitted for small bowel obstruction.  ? ?SBO ?Gastric pneumatosis ?No further episodes of nausea, vomiting.  He is passing stools without difficulty.  He does not endorse abdominal pain. Patient advancing diet well, he was kept on a clear liquid diet overnight and would like to eat whole foods today.  Transition to regular diet and did well.  Patient's family are concerned about taking him home so quickly after just finishing meal.  Would like him to stay an additional evening to make certain that he has no other complications from eating.  General surgery has cleared patient for discharge ?-Continue carb modified diet ?-Continue to monitor bowel function ?-Continue to encourage ambulation ? ?Iron deficiency anemia ?IV iron infusion given yesterday.  We will repeat levels in a few weeks after discharge.  He will need colonoscopy moving forward to  rule out malignancy as underlying etiology for his iron deficiency anemia. ?-Given iron transfusion yesterday ?-Repeat testing in a few weeks outpatient ?-Follow-up colonoscopy ? ?Diet: Carb-Modified ?IVF: None,None ?VTE: Enoxaparin ?Code: Full ?Family Update: Patient's mother at bedside ? ?Dispo: Anticipated discharge to Home tomorrow ? ?Vasili Averianna Brugger DO ?Internal Medicine Resident PGY-2 ?Pager [phone] ?Please contact the on call pager after 5 pm and on weekends at [phone]. ? ?

## 2022-03-23 NOTE — Progress Notes (Signed)
? ?  Subjective/Chief Complaint: ?Used ASL translating service ?Pt doing well ?Having BMs ?No nausea or emesis ? ? ?Objective: ?Vital signs in last 24 hours: ?Temp:  [97.8 ?F (36.6 ?C)-98.4 ?F (36.9 ?C)] 98.3 ?F (36.8 ?C) (04/22 0417) ?Pulse Rate:  [62-77] 62 (04/22 0417) ?Resp:  [18-19] 18 (04/22 0417) ?BP: (123-148)/(77-93) 123/77 (04/22 0417) ?SpO2:  [99 %-100 %] 100 % (04/22 0417) ?Last BM Date : 03/21/22 ? ?Intake/Output from previous day: ?04/21 0701 - 04/22 0700 ?In: 2352.4 [P.O.:1300; I.V.:532.4; IV Piggyback:520] ?Out: 2000 [Urine:2000] ?Intake/Output this shift: ?No intake/output data recorded. ? ?PE: ? ?Constitutional: No acute distress, conversant, appears states age. ?Eyes: Anicteric sclerae, moist conjunctiva, no lid lag ?Lungs: Clear to auscultation bilaterally, normal respiratory effort ?CV: regular rate and rhythm, no murmurs, no peripheral edema, pedal pulses 2+ ?GI: Soft, no masses or hepatosplenomegaly, non-tender to palpation ?Skin: No rashes, palpation reveals normal turgor ?Psychiatric: appropriate judgment and insight, oriented to person, place, and time ? ? ?Lab Results:  ?Recent Labs  ?  03/22/22 ?0116 03/23/22 ?0104  ?WBC 8.5 9.0  ?HGB 9.0* 9.0*  ?HCT 30.5* 29.5*  ?PLT 240 231  ? ?BMET ?Recent Labs  ?  03/22/22 ?0116 03/23/22 ?0104  ?NA 139 140  ?K 3.1* 3.4*  ?CL 107 110  ?CO2 26 25  ?GLUCOSE 109* 106*  ?BUN 7* 6*  ?CREATININE 0.91 0.94  ?CALCIUM 8.2* 8.6*  ? ?PT/INR ?No results for input(s): LABPROT, INR in the last 72 hours. ?ABG ?No results for input(s): PHART, HCO3 in the last 72 hours. ? ?Invalid input(s): PCO2, PO2 ? ?Studies/Results: ?No results found. ? ?Anti-infectives: ?Anti-infectives (From admission, onward)  ? ? None  ? ?  ? ? ?Assessment/Plan: ?SBO ?CT with gastric pneumatosis ?No prior abdominal surgeries ?-OK to adv diet as tol ?-No need for surgery and OK for home when OK with medicine team ? ?Please call back if needed ? ?  ?An ASL interpreter was present via video  service during the history-taking and subsequent discussion (and for part of the physical exam) with this patient. ? ? LOS: 4 days  ? ? ?Ralene Ok ?03/23/2022 ? ?

## 2022-03-24 LAB — GLUCOSE, CAPILLARY
Glucose-Capillary: 115 mg/dL — ABNORMAL HIGH (ref 70–99)
Glucose-Capillary: 88 mg/dL (ref 70–99)
Glucose-Capillary: 90 mg/dL (ref 70–99)
Glucose-Capillary: 91 mg/dL (ref 70–99)

## 2022-03-24 MED ORDER — PANTOPRAZOLE SODIUM 40 MG PO TBEC
40.0000 mg | DELAYED_RELEASE_TABLET | Freq: Every day | ORAL | 1 refills | Status: DC
Start: 2022-03-24 — End: 2022-03-24

## 2022-03-24 MED ORDER — PANTOPRAZOLE SODIUM 40 MG PO TBEC: 40.0000 mg | DELAYED_RELEASE_TABLET | Freq: Every day | ORAL | 1 refills | Status: DC

## 2022-03-24 NOTE — Discharge Instructions (Addendum)
Leonard Tucker,  ? ?Thank you for allowing me to care for you during your hospitalization. I am glad you are feeling better and able to eat/drink without nausea or vomiting. You were found to have a blockage in your bowel that resolved. We are still uncertain as to what caused this, I would like you to follow up in the clinic in the next week to make certain you are still doing well. Please continue to eat smaller meal portions over the coming days.  ? ?We will also help you follow up with the gastroenterology doctors to plan for a colonoscopy. I will also place a referral to the social workers in our clinic to assist you in finding a sign language interpretor service to use at home.  ? ?Please stop taking your iron for the next 2 weeks. Please stop taking prilosec, we will change you to protonix for your acid pill.  ? ?If you have any questions please call our clinic.  ? ?My Best,  ?Dr. Johnney Ou ?

## 2022-03-24 NOTE — Discharge Summary (Signed)
? ?Name: Leonard Tucker ?MRN: [id number] ?DOB: Sep 01, 1956 66 y.o. ?PCP: Riesa Pope, MD ? ?Date of Admission: 03/18/2022 10:50 PM ?Date of Discharge: 03/24/22 ?Attending Physician: Dr. Evette Doffing ? ?Discharge Diagnosis: ?Principal Problem: ?  Small bowel obstruction (Leonard Tucker) ?Active Problems: ?  Iron deficiency anemia due to chronic blood loss ?  Pneumatosis of intestines ?  Acute kidney injury (Leonard Tucker) ?  ? ?Discharge Medications: ?Allergies as of 03/24/2022   ? ?   Reactions  ? Chlorpheniramine-dm Nausea And Vomiting  ? Robitussin Dm [dextromethorphan-guaifenesin] Other (See Comments)  ? "Acted out more than normal" - agitation  ? ?  ? ?  ?Medication List  ?  ? ?STOP taking these medications   ? ?iron polysaccharides 150 MG capsule ?Commonly known as: NIFEREX ?  ?omeprazole 20 MG capsule ?Commonly known as: PRILOSEC ?  ? ?  ? ?TAKE these medications   ? ?acetaminophen 650 MG CR tablet ?Commonly known as: TYLENOL ?Take 650 mg by mouth daily as needed for pain. ?  ?atenolol 25 MG tablet ?Commonly known as: TENORMIN ?TAKE 1 TABLET BY MOUTH EVERY DAY ?What changed: when to take this ?  ?fluPHENAZine decanoate 25 MG/ML injection ?Commonly known as: PROLIXIN ?Inject 32.5 mg into the muscle every 21 ( twenty-one) days. Administered at [facility] in [place] ?  ?fluticasone 50 MCG/ACT nasal spray ?Commonly known as: FLONASE ?SPRAY 2 SPRAYS INTO EACH NOSTRIL EVERY DAY ?What changed: See the new instructions. ?  ?hydrocortisone 2.5 % rectal cream ?Commonly known as: Anusol-HC ?Apply rectally 2 times daily as needed.  Do not use for more than 1 week at a time. ?What changed:  ?how much to take ?how to take this ?when to take this ?reasons to take this ?additional instructions ?  ?pantoprazole 40 MG tablet ?Commonly known as: Protonix ?Take 1 tablet (40 mg total) by mouth daily. ?  ?polyethylene glycol powder 17 GM/SCOOP powder ?Commonly known as: GaviLAX ?TAKE 17 G BY MOUTH DAILY AS NEEDED FOR MILD CONSTIPATION. ?What changed:   ?how much to take ?how to take this ?when to take this ?reasons to take this ?additional instructions ?  ?pravastatin 40 MG tablet ?Commonly known as: PRAVACHOL ?Take 1 tablet (40 mg total) by mouth daily. ?  ?senna-docusate 8.6-50 MG tablet ?Commonly known as: Senokot-S ?Take 1 tablet by mouth at bedtime. ?What changed:  ?when to take this ?reasons to take this ?  ?triamterene-hydrochlorothiazide 37.5-25 MG capsule ?Commonly known as: DYAZIDE ?TAKE 1 CAPSULE BY MOUTH EVERY DAY ?What changed: when to take this ?  ? ?  ? ? ?Disposition and follow-up:   ?[name] was discharged from [facility] in Stable condition.  At the hospital follow up visit please address: ? ?1.  Follow-up: ? a. Nausea/Vomiting - Make certain did not recur  ?  ? b. Iron Def Anemia - Needs colonoscopy referral with IDA/SBO ? ? c. Hypokalemia - Follow up potassium/mag ? ? d. Hearing impairment - Follow up with social work for home ASL needs ? ?2.  Labs / imaging needed at time of follow-up: CBC,BMP ? ?3.  Pending labs/ test needing follow-up: None ? ?4.  Medication Changes ? Started: Protonix 40 mg daily ? Stopped: Omeprazole ? Changed: None ? Abx - None ? ?Follow-up Appointments: ? Follow-up Information   ? ? Riesa Pope, MD. Schedule an appointment as soon as possible for a visit.   ?Specialty: Internal Medicine ?Contact information: ?[address] ?[place] [address] ?[phone] ? ? ?  ?  ? ?  ?  ? ?  ? ? ?  Hospital Course by problem list: ? ?Small Bowel Obstruction ?Prior to admission patient had multiple episodes of nausea and vomiting. CT imaging was consistent with a small bowel obstruction.  He was eventually able to tolerate NG tube placement and Gastrografin.  He did well and had resolution of his small bowel obstruction and advancement of his diet to solids.  He had several high risk features on his CT scan including pneumatosis in the gastric body and portal veins.  There is no clear explanation  for his small bowel obstruction and with his history of IDA there is concern for malignancy. He will follow up in our clinic this week and recommend placing referral for colonoscopy.  ? ?Iron Deficiency Anemia ?Longstanding history of iron deficiency anemia.  Has attempted multiple colonoscopies in the past however has not been able to tolerate the bowel prep.  Would highly recommend the family pursue this as with his small bowel obstruction and no clear explanation, need to rule out malignancy as the etiology for both of these. ? ?Acute Kidney Injury ?Baseline Cr of 0.9-1.0. On admission Cr of 1.27. This resolved with IV fluid and advancing his diet.  ? ?Electrolyte Abnormalities ?Patient with episodes of hypokalemia during his hospitalization.  Believe that this was secondary to his vomiting and diuretic use.  This was repleted during the hospitalization.  Would recommend repeating BMP at hospital follow-up. ? ?Acid Reflux ?Transitioned from omeprazole to protonix 40 mg daily with hematemesis. Will need GI follow outpatient for his IDA as well as episodes of hematemesis. These resolved early on in his hospitalization and did not reoccur.  ? ?Discharge Subjective: ?Doing well, without acute concerns. Has eaten a regular diet without abdominal discomfort, nausea or vomiting. Discussed with family to follow up in clinic and to call with any concerns prior to this appointment.  ? ?Discharge Exam:   ?BP (!) 144/91 (BP Location: Left Arm)   Pulse 75   Temp 98.3 ?F (36.8 ?C) (Oral)   Resp 18   SpO2 99%  ?Constitutional: well-appearing, no acute distress ?Eyes: conjunctiva non-erythematous ?Neck: supple ?Pulmonary/Chest: normal work of breathing on room air ?Abdominal: soft, non-tender, non-distended ?MSK: normal bulk and tone ?Skin: warm and dry ?Psych: Pleasant  ? ?Pertinent Labs, Studies, and Procedures:  ? ?  Latest Ref Rng & Units 03/23/2022  ?  1:04 AM 03/22/2022  ?  1:16 AM 03/21/2022  ?  2:34 AM  ?CBC  ?WBC 4.0 -  10.5 K/uL 9.0   8.5   8.5    ?Hemoglobin 13.0 - 17.0 g/dL 9.0   9.0   9.3    ?Hematocrit 39.0 - 52.0 % 29.5   30.5   30.2    ?Platelets 150 - 400 K/uL 231   240   210    ? ? ? ?  Latest Ref Rng & Units 03/23/2022  ?  1:04 AM 03/22/2022  ?  1:16 AM 03/21/2022  ?  2:34 AM  ?CMP  ?Glucose 70 - 99 mg/dL 106   109   81    ?BUN 8 - 23 mg/dL '6   7   12    '$ ?Creatinine 0.61 - 1.24 mg/dL 0.94   0.91   0.86    ?Sodium 135 - 145 mmol/L 140   139   141    ?Potassium 3.5 - 5.1 mmol/L 3.4   3.1   3.3    ?Chloride 98 - 111 mmol/L 110   107   106    ?  CO2 22 - 32 mmol/L '25   26   27    '$ ?Calcium 8.9 - 10.3 mg/dL 8.6   8.2   8.3    ? ? ?CT ABDOMEN PELVIS W CONTRAST ? ?Addendum Date: 03/19/2022   ?ADDENDUM REPORT: 03/19/2022 03:06 ADDENDUM: These results were called by telephone at the time of interpretation on 03/19/2022 at 2:56 am to provider [facility] , who verbally acknowledged these results. Electronically Signed   By: Fidela Salisbury M.D.   On: 03/19/2022 03:06  ? ?Result Date: 03/19/2022 ?CLINICAL DATA:  Abdominal pain, acute, nonlocalized. Small-bowel obstruction. EXAM: CT ABDOMEN AND PELVIS WITH CONTRAST TECHNIQUE: Multidetector CT imaging of the abdomen and pelvis was performed using the standard protocol following bolus administration of intravenous contrast. RADIATION DOSE REDUCTION: This exam was performed according to the departmental dose-optimization program which includes automated exposure control, adjustment of the mA and/or kV according to patient size and/or use of iterative reconstruction technique. CONTRAST:  134m OMNIPAQUE IOHEXOL 300 MG/ML  SOLN COMPARISON:  None. FINDINGS: Lower chest: Mild bibasilar atelectasis. Cardiac size is within normal limits. Small fluid within the distal esophagus may relate to gastroesophageal reflux. Hepatobiliary: There is peripheral portal venous gas within the non dependent left hepatic lobe. No enhancing intrahepatic mass. No intra or extrahepatic biliary ductal dilation.  Gallbladder unremarkable. Pancreas: Unremarkable Spleen: Unremarkable Adrenals/Urinary Tract: The adrenal glands are unremarkable. Multiple simple renal cortical and parapelvic cysts are seen bilaterally. No follow-up

## 2022-03-24 NOTE — Progress Notes (Signed)
Mobility Specialist Progress Note: ? ? 03/24/22 0932  ?Mobility  ?Activity Ambulated with assistance in hallway  ?Level of Assistance Contact guard assist, steadying assist  ?Assistive Device Front wheel walker  ?Distance Ambulated (ft) 400 ft  ?Activity Response Tolerated well  ?$Mobility charge 1 Mobility  ? ?Pt received EOB willing to participate in mobility. No complaints of pain. Pt required 1 seated break. Left on couch with family present and all needs met.  ? ?Tavis Kring ?Mobility Specialist ?Primary Phone [phone] ? ?

## 2022-03-25 ENCOUNTER — Telehealth: Payer: Self-pay | Admitting: *Deleted

## 2022-03-25 ENCOUNTER — Ambulatory Visit (INDEPENDENT_AMBULATORY_CARE_PROVIDER_SITE_OTHER): Payer: Medicare Other | Admitting: Internal Medicine

## 2022-03-25 ENCOUNTER — Encounter: Payer: Self-pay | Admitting: Internal Medicine

## 2022-03-25 VITALS — BP 144/79 | HR 104 | Temp 98.4°F

## 2022-03-25 DIAGNOSIS — I10 Essential (primary) hypertension: Secondary | ICD-10-CM | POA: Diagnosis not present

## 2022-03-25 DIAGNOSIS — N179 Acute kidney failure, unspecified: Secondary | ICD-10-CM

## 2022-03-25 DIAGNOSIS — F79 Unspecified intellectual disabilities: Secondary | ICD-10-CM | POA: Diagnosis not present

## 2022-03-25 DIAGNOSIS — K56609 Unspecified intestinal obstruction, unspecified as to partial versus complete obstruction: Secondary | ICD-10-CM

## 2022-03-25 NOTE — Assessment & Plan Note (Signed)
BP Readings from Last 3 Encounters:  ?03/25/22 (!) 144/79  ?03/24/22 (!) 144/91  ?03/18/22 127/70  ? ?Patient's blood pressure slightly elevated. He is on atenolol '25mg'$  daily and triamterene-HCTZ 37.5-'25mg'$  daily. Per mother, patient has been compliant with his medications. He is asymptomatic.  ? ?Plan: ? Continue current regimen ?BMP today ?Follow up in 2 weeks for BP check and medication adjustment as needed  ?

## 2022-03-25 NOTE — Patient Instructions (Signed)
Mr Leonard Tucker, ? ?It was a pleasure seeing you in clinic. Today we discussed:  ? ?Small bowel obstruction follow up: ?I'm glad you are feeling better. I will be checking some labs today and call with any abnormal results. ?I have sent a referral to the stomach doctors - we will need to schedule a colonoscopy.  ? ?Community services: ?I have sent a referral to our social workers to help with coordination of services and appointments.  ? ? ?If you have any questions or concerns, please call our clinic at [phone] between 9am-5pm and after hours call [phone] and ask for the internal medicine resident on call. If you feel you are having a medical emergency please call 911.  ? ?Thank you, we look forward to helping you remain healthy! ? ? ? ?

## 2022-03-25 NOTE — Telephone Encounter (Signed)
? ?  Telephone encounter was:  Unsuccessful.  03/25/2022 ?Name: Leonard Tucker MRN: [id number] DOB: 1956/01/02 ? ?Unsuccessful outbound call made today to assist with:   assistive technology ? ?Outreach Attempt:  1st Attempt ? ?A HIPAA compliant voice message was left requesting a return call.  Instructed patient to call back at   Instructed patient to call back at [phone]  at their earliest convenience. . ? ?Allie Ousley Greenauer -Selinda Eon ?Care Guide , Embedded Care Coordination ?[place], Care Management  ?[phone] ?[address] , [place] [address] ?Email : Ashby [email or web address]. [email or web address] [email or web address] ?  ? ?

## 2022-03-25 NOTE — Progress Notes (Signed)
? ?  CC: hospital follow up  ? ?HPI: ? ?[name] is a 66 y.o. male with PMHx as stated below presenting for follow up of recent hospitalization for SBO (4/17-4/23). He is accompanied by his mother who also helps with interpretation and history. He reports improvement of symptoms today and denies any acute concerns. Please see problem based charting for complete assessment and plan.  ? ?Past Medical History:  ?Diagnosis Date  ? Chronic constipation   ? Deaf-mutism   ? Dental caries   ? History of iron deficiency anemia   ? Hypertriglyceridemia   ? Intertrigo   ? MR (mental retardation)   ? Schizophrenia, paranoid, chronic ([place])   ? Umbilical hernia   ? ?Review of Systems:  Negative except as stated in HPI ? ?Physical Exam: ? ?Vitals:  ? 03/25/22 1053  ?BP: (!) 144/79  ?Pulse: (!) 104  ?Temp: 98.4 ?F (36.9 ?C)  ?TempSrc: Oral  ?SpO2: 94%  ? ?Physical Exam  ?Constitutional: Appears well-developed and well-nourished. No distress.  ?HENT: Normocephalic and atraumatic, moist mucous membranes ?Cardiovascular: Mildly tachycardic, regular rhythm, S1 and S2 present, no murmurs, rubs, gallops.  Distal pulses intact ?Respiratory: Effort is normal.  Lungs are clear to auscultation bilaterally. ?GI: Distended but soft, nontender to palpation and normal bowel sounds  ?Musculoskeletal: Normal bulk and tone.  No peripheral edema noted. ?Neurological: Is alert and oriented x4, no apparent focal deficits noted. ?Skin: Warm and dry.  No rash, erythema, lesions noted. ? ?Assessment & Plan:  ? ?See Encounters Tab for problem based charting. ? ?Patient discussed with Dr. Dareen Piano ? ?

## 2022-03-25 NOTE — Assessment & Plan Note (Signed)
Patient is presenting for hospital follow up today with his mother. He initially presented on 4/17 with multiple episodes of nausea and vomiting with CT Abdomen/Pelvis significant for SBO with high risk features including pneumatosis in the gastric body and portal veins. He was hospitalized 4/17-4/23 for small bowel obstruction treated with NG tube and gastrograffin with improvement in symptoms. His diet was advanced to solids and patient did well with this. He has been able to tolerate solids at home as well. He denies any further episodes of nausea or vomiting. He has been having flatulence and reports daily normal bowel movements.  ?Discussed with patient and mother concern for malignancy given history of iron deficiency anemia and small bowel obstruction. Recommended for referral to GI for colonoscopy. They express understanding. ? ?Plan: ?Referral to GI for colonoscopy ?Continue to monitor  ?

## 2022-03-25 NOTE — Assessment & Plan Note (Addendum)
Patient with normal baseline sCr noted to have elevation to 1.27 on admission that improved with IV fluids and oral intake. He was also noted to be hypokalemic initially in setting of GI losses and diuretic use. Received potassium replacement during hospitalization. ? ?Plan: ?BMP today ? ?ADDENDUM: ?BMP today with stable renal function and electrolytes wnl - hypokalemia resolved.  ?

## 2022-03-25 NOTE — Assessment & Plan Note (Addendum)
Patient is reliant on mother and sister for support. Will refer to CCM for outpatient services and assistance with scheduling of his behavioral health appointment as he missed this during hospitalization ?

## 2022-03-26 LAB — BMP8+ANION GAP
Anion Gap: 17 mmol/L (ref 10.0–18.0)
BUN/Creatinine Ratio: 12 (ref 10–24)
BUN: 12 mg/dL (ref 8–27)
CO2: 20 mmol/L (ref 20–29)
Calcium: 9.4 mg/dL (ref 8.6–10.2)
Chloride: 103 mmol/L (ref 96–106)
Creatinine, Ser: 1.02 mg/dL (ref 0.76–1.27)
Glucose: 108 mg/dL — ABNORMAL HIGH (ref 70–99)
Potassium: 4.2 mmol/L (ref 3.5–5.2)
Sodium: 140 mmol/L (ref 134–144)
eGFR: 82 mL/min/{1.73_m2} (ref >59)

## 2022-03-27 ENCOUNTER — Telehealth: Payer: Self-pay | Admitting: *Deleted

## 2022-03-27 NOTE — Progress Notes (Signed)
Internal Medicine Clinic Attending ° °Case discussed with Dr. Aslam  At the time of the visit.  We reviewed the resident’s history and exam and pertinent patient test results.  I agree with the assessment, diagnosis, and plan of care documented in the resident’s note.  °

## 2022-03-27 NOTE — Chronic Care Management (AMB) (Signed)
?  Care Management  ? ?Outreach Note ? ?03/27/2022 ?Name: Leonard Tucker MRN: [id number] DOB: 1956/11/12 ? ?Referred by: Riesa Pope, MD ?Reason for referral : Care Coordination (Initial outreach to schedule referral for BSW ) ? ? ?An unsuccessful telephone outreach was attempted today. The patient was referred to the case management team for assistance with care management and care coordination.  ? ?Follow Up Plan:  ?A HIPAA compliant phone message was left for the patient providing contact information and requesting a return call. The care management team will reach out to the patient again over the next 7 days. If patient returns call to provider office, please advise to call [facility] at [phone]. ? ?Laverda Sorenson  ?Care Guide, Embedded Care Coordination ?[place] [facility]  Care Management  ?Direct Dial: [phone] ? ?

## 2022-04-04 NOTE — Chronic Care Management (AMB) (Signed)
?  Care Management  ? ?Outreach Note ? ?04/04/2022 ?Name: TERAN KNITTLE MRN: [id number] DOB: [birth year] ? ?Referred by: Riesa Pope, MD ?Reason for referral : Care Coordination (Initial outreach to schedule referral for BSW ) ? ? ?A second unsuccessful telephone outreach was attempted today. The patient was referred to the case management team for assistance with care management and care coordination.  ? ?Follow Up Plan:  ?A HIPAA compliant phone message was left for the patient providing contact information and requesting a return call.  ?The care management team will reach out to the patient again over the next 7 days.  ?If patient returns call to provider office, please advise to call [facility]* at [phone].* ? ?Laverda Sorenson  ?Care Guide, Embedded Care Coordination ?  Care Management  ?Direct Dial: [phone] ? ?

## 2022-04-08 ENCOUNTER — Telehealth: Payer: Self-pay | Admitting: *Deleted

## 2022-04-08 ENCOUNTER — Encounter: Payer: Medicare Other | Admitting: Student

## 2022-04-08 NOTE — Telephone Encounter (Signed)
? ?  Telephone encounter was:  Unsuccessful.  04/08/2022 ?Name: DEQUANN VANDERVELDEN MRN: [id number] DOB: [birth year] ? ?Unsuccessful outbound call made today to assist with:   sign language ? ?Outreach Attempt:  2nd Attempt ? ?A HIPAA compliant voice message was left requesting a return call.  Instructed patient to call back at   Instructed patient to call back at [phone]  at their earliest convenience. . ? ?Adiba Fargnoli Greenauer -Selinda Eon ?Care Guide , Embedded Care Coordination ?[facility] [place], Care Management  ?[phone] ?[address] , [place] [address] ?Email : Ashby Dawes. [email or web address]-moran [email or web address] ?  ?

## 2022-04-09 ENCOUNTER — Telehealth: Payer: Self-pay | Admitting: *Deleted

## 2022-04-09 NOTE — Telephone Encounter (Signed)
? ?  Telephone encounter was:  Unsuccessful.  04/09/2022 ?Name: Leonard Tucker MRN: [id number] DOB: November 25, 1956 ? ?Unsuccessful outbound call made today to assist with:  Food Insecurity ? ?Outreach Attempt:  3rd Attempt.  Referral closed unable to contact patient. ? ?A HIPAA compliant voice message was left requesting a return call.  Instructed patient to call back at   Instructed patient to call back at [phone]  at their earliest convenience. . ? ?Jazalynn Mireles Greenauer -Selinda Eon ?Care Guide , Embedded Care Coordination ?[place], Care Management  ?[phone] ?[address] , [place] [address] ?Email : [email or web address] [email or web address] ?  ? ?

## 2022-04-11 NOTE — Chronic Care Management (AMB) (Signed)
?  Care Management  ? ?Outreach Note ? ?04/11/2022 ?Name: Leonard Tucker MRN: [id number] DOB: November 24, 1956 ? ?Referred by: Riesa Pope, MD ?Reason for referral : Care Coordination (Initial outreach to schedule referral for BSW ) ? ? ?Third unsuccessful telephone outreach was attempted today. The patient was referred to the case management team for assistance with care management and care coordination. The patient's primary care provider has been notified of our unsuccessful attempts to make or maintain contact with the patient. The care management team is pleased to engage with this patient at any time in the future should he/she be interested in assistance from the care management team.  ? ?Follow Up Plan:  ?We have been unable to make contact with the patient. The care management team is available to follow up with the patient after provider conversation with the patient regarding recommendation for care management engagement and subsequent re-referral to the care management team.  ? ?Laverda Sorenson  ?Care Guide, Embedded Care Coordination ?[facility]  Care Management  ?Direct Dial: [phone] ? ?

## 2022-04-15 ENCOUNTER — Ambulatory Visit (INDEPENDENT_AMBULATORY_CARE_PROVIDER_SITE_OTHER): Payer: Medicare Other | Admitting: Student

## 2022-04-15 ENCOUNTER — Encounter: Payer: Self-pay | Admitting: Student

## 2022-04-15 VITALS — BP 103/89 | HR 89 | Temp 97.6°F | Ht 68.0 in | Wt 204.0 lb

## 2022-04-15 DIAGNOSIS — I10 Essential (primary) hypertension: Secondary | ICD-10-CM | POA: Diagnosis not present

## 2022-04-15 DIAGNOSIS — E785 Hyperlipidemia, unspecified: Secondary | ICD-10-CM

## 2022-04-15 DIAGNOSIS — K219 Gastro-esophageal reflux disease without esophagitis: Secondary | ICD-10-CM

## 2022-04-15 DIAGNOSIS — K56609 Unspecified intestinal obstruction, unspecified as to partial versus complete obstruction: Secondary | ICD-10-CM | POA: Diagnosis not present

## 2022-04-15 DIAGNOSIS — J301 Allergic rhinitis due to pollen: Secondary | ICD-10-CM | POA: Diagnosis not present

## 2022-04-15 DIAGNOSIS — F79 Unspecified intellectual disabilities: Secondary | ICD-10-CM | POA: Diagnosis not present

## 2022-04-15 MED ORDER — TRIAMTERENE-HCTZ 37.5-25 MG PO CAPS: 1.0000 | ORAL_CAPSULE | Freq: Every day | ORAL | 3 refills | Status: DC

## 2022-04-15 MED ORDER — PRAVASTATIN SODIUM 40 MG PO TABS: 40.0000 mg | ORAL_TABLET | Freq: Every day | ORAL | 3 refills | Status: DC

## 2022-04-15 MED ORDER — PANTOPRAZOLE SODIUM 40 MG PO TBEC: 40.0000 mg | DELAYED_RELEASE_TABLET | Freq: Two times a day (BID) | ORAL | 1 refills | Status: DC

## 2022-04-15 NOTE — Assessment & Plan Note (Signed)
Patient and mother have been living with his sister in [place] and not in their home in [place].  Mother is concerned that they may not have received call for GI follow-up as they have not been home lately.  I provided contact information for [facility] for follow-up with GI and colonoscopy. ?

## 2022-04-15 NOTE — Patient Instructions (Addendum)
Bowel obstruction ?Please call [facility] to make an appointment for colonoscopy ?[facility] ?Gastroenterologist in [place], [place] ?Address: [address], [place], [place] [address] ?Phone: [phone] ? ? ?Cough  ?Please increase pantoprazole to twice daily ?Stop omeprazole ?If having stuffy nose or nasal drainage make sure you are using flonase daiy  ?

## 2022-04-15 NOTE — Assessment & Plan Note (Signed)
Mother reports he has been coughing more at night.  He denies any nasal drainage.  He does tend to have allergies and uses Flonase as needed.  Discussed trying to use Flonase daily to see if this improves his cough.  Reviewed administration instructions with mother. ?

## 2022-04-15 NOTE — Progress Notes (Signed)
? ?Established Patient Office Visit ? ?Subjective   ?Patient ID: Leonard Tucker, male    DOB: Jul 23, 1956  Age: 66 y.o. MRN: [id number] ? ?Chief Complaint  ?Patient presents with  ? Small bowel obstruction ([place])  ? ? ?Leonard Tucker is a 66 year old male with past medical history listed below who presents for follow up of SBO and cough. Patient with history of developmental delay and is non verbal. Mother present in room to interpret. Please refer to problem based charting for further details and assessment and plan of current problem and chronic medical conditions. ? ? ? ?Patient Active Problem List  ? Diagnosis Date Noted  ? Small bowel obstruction ([place]) 03/19/2022  ? Pneumatosis of intestines 03/19/2022  ? Acute kidney injury ([place]) 03/19/2022  ? Seasonal allergies 03/21/2021  ? External hemorrhoids 09/22/2020  ? Diarrhea 08/19/2018  ? Need for immunization against influenza 08/19/2018  ? Healthcare maintenance 02/10/2013  ? Iron deficiency anemia due to chronic blood loss 11/01/2011  ? Fungal infection of toenail 08/21/2011  ? GERD (gastroesophageal reflux disease) 06/28/2011  ? HYPOKALEMIA 03/27/2010  ? Allergic rhinitis 10/18/2009  ? Constipation 05/19/2007  ? Dyslipidemia 11/18/2006  ? PARANOID SCHIZOPHRENIA, CHRONIC 10/13/2006  ? MENTAL RETARDATION 10/13/2006  ? DEAF MUTISM 10/13/2006  ? Essential hypertension 10/13/2006  ? Dental caries 10/13/2006  ? ?  ? ?Review of Systems  ?All other systems reviewed and are negative. ? ?  ?Objective:  ?  ? ?BP 103/89 (BP Location: Left Arm, Patient Position: Sitting, Cuff Size: Normal)   Pulse 89   Temp 97.6 ?F (36.4 ?C) (Oral)   Ht '5\' 8"'$  (1.727 m)   Wt 204 lb (92.5 kg)   SpO2 97%   BMI 31.02 kg/m?  ?BP Readings from Last 3 Encounters:  ?04/15/22 103/89  ?03/25/22 (!) 144/79  ?03/24/22 (!) 144/91  ? ?  ? ?Physical Exam ?Constitutional:   ?   Appearance: He is obese. He is not ill-appearing.  ?Cardiovascular:  ?   Rate and Rhythm: Normal rate and regular rhythm.  ?    Pulses: Normal pulses.  ?   Heart sounds: No murmur heard. ?  No friction rub. No gallop.  ?Pulmonary:  ?   Effort: Pulmonary effort is normal.  ?   Breath sounds: Normal breath sounds. No wheezing, rhonchi or rales.  ?Abdominal:  ?   General: Abdomen is flat. Bowel sounds are normal. There is no distension.  ?   Palpations: Abdomen is soft.  ?   Tenderness: There is no abdominal tenderness. There is no guarding or rebound.  ?Musculoskeletal:  ?   Right lower leg: No edema.  ?   Left lower leg: No edema.  ?Skin: ?   General: Skin is warm and dry.  ?Neurological:  ?   Mental Status: He is alert. Mental status is at baseline.  ? ? ?  ?Assessment & Plan:  ? ?Problem List Items Addressed This Visit   ? ?  ? Cardiovascular and Mediastinum  ? Essential hypertension  ? Relevant Medications  ? triamterene-hydrochlorothiazide (DYAZIDE) 37.5-25 MG capsule  ? pravastatin (PRAVACHOL) 40 MG tablet  ?  ? Respiratory  ? Allergic rhinitis  ?  Mother reports he has been coughing more at night.  He denies any nasal drainage.  He does tend to have allergies and uses Flonase as needed.  Discussed trying to use Flonase daily to see if this improves his cough.  Reviewed administration instructions with mother. ? ?  ?  ?  ?  Digestive  ? GERD (gastroesophageal reflux disease)  ?  Previously on pantoprazole and recently discharged after SBO on omeprazole.  Patient reports more cough with laying down lately at night times.  Cough is nonproductive.  No fevers or chills or other signs of infections.  Lung exam is clear.  Mother states she has not been giving him omeprazole as she was told to hold this for several weeks before restarting.  I suspect this cough may be secondary to GERD.  We will have him discontinue omeprazole and resume his home pantoprazole 40 mg twice daily.  We will also need follow-up with GI given recent admission for SBO and anemia concerning for possible malignancy.  ? ?  ?  ? Relevant Medications  ? pantoprazole  (PROTONIX) 40 MG tablet  ? Small bowel obstruction ([place])  ?  Patient and mother have been living with his sister in [place] and not in their home in [place].  Mother is concerned that they may not have received call for GI follow-up as they have not been home lately.  I provided contact information for [facility] for follow-up with GI and colonoscopy. ? ?  ?  ?  ? Other  ? Dyslipidemia  ? Relevant Medications  ? pravastatin (PRAVACHOL) 40 MG tablet  ? MENTAL RETARDATION - Primary  ? Relevant Orders  ? AMB Referral to [facility]  ? ? ?Return in about 3 months (around 07/16/2022).  ? ? ?[name], MD ? ?Discussed with Dr. Cain Sieve ? ?

## 2022-04-15 NOTE — Assessment & Plan Note (Addendum)
Previously on pantoprazole and recently discharged after SBO on omeprazole.  Patient reports more cough with laying down lately at night times.  Cough is nonproductive.  No fevers or chills or other signs of infections.  Lung exam is clear.  Both pantoprazole and omeprazole in his medication bag today.  Mother has not been giving him omeprazole as she was told to hold this for several weeks before restarting, unclear if he has been taking pantoprazole.  I suspect his GERD may be contributing to his cough.  We will have him discontinue omeprazole and increase his home pantoprazole 40 mg twice daily.  We will also need follow-up with GI given recent admission for SBO and anemia concerning for possible malignancy.  ?

## 2022-04-16 NOTE — Progress Notes (Signed)
Internal Medicine Clinic Attending ? ?Case discussed with Dr. Liang  At the time of the visit.  We reviewed the resident?s history and exam and pertinent patient test results.  I agree with the assessment, diagnosis, and plan of care documented in the resident?s note. ? ?

## 2022-04-24 ENCOUNTER — Other Ambulatory Visit: Payer: Self-pay | Admitting: Student

## 2022-04-25 ENCOUNTER — Telehealth: Payer: Self-pay | Admitting: *Deleted

## 2022-04-25 NOTE — Chronic Care Management (AMB) (Signed)
  Care Management   Note  04/25/2022 Name: [name] MRN: [id number] DOB: Mar 12, 1956  Leonard Tucker is a 66 y.o. year old male who is a primary care patient of Katsadouros, Candace Gallus, MD. I reached out to Philis Pique by phone today offer care coordination services.   [name] was given information about care management services today including:  Care management services include personalized support from designated clinical staff supervised by his physician, including individualized plan of care and coordination with other care providers 24/7 contact phone numbers for assistance for urgent and routine care needs. The patient may stop care management services at any time by phone call to the office staff.  Ed Blalock mother  verbally agreed to assistance and services provided by embedded care coordination/care management team today.  Follow up plan: Telephone appointment with care management team member scheduled for:05/10/22  [facility] Management  Direct Dial: [phone]

## 2022-05-02 ENCOUNTER — Other Ambulatory Visit: Payer: Self-pay | Admitting: Student

## 2022-05-02 DIAGNOSIS — I10 Essential (primary) hypertension: Secondary | ICD-10-CM

## 2022-05-07 ENCOUNTER — Telehealth: Payer: Self-pay | Admitting: *Deleted

## 2022-05-07 NOTE — Telephone Encounter (Signed)
   Telephone encounter was:  Successful.  05/07/2022 Name: MACRAE WIEGMAN MRN: [id number] DOB: [birth year]  Philis Pique is a 66 y.o. year old male who is a primary care patient of Katsadouros, Candace Gallus, MD . The community resource team was consulted for assistance with Patient sister called back finally and I am sending [place] dept of hard of hearing information to her via mail  Care guide performed the following interventions: Patient provided with information about care guide support team and interviewed to confirm resource needs Follow up call placed to community resources to determine status of patients referral.  Follow Up Plan:  No further follow up planned at this time. The patient has been provided with needed resources. [place], Care Management  [phone] [address] , [place] [phone] Email : [email or web address]

## 2022-05-10 ENCOUNTER — Telehealth: Payer: Medicare Other

## 2022-05-10 ENCOUNTER — Telehealth: Payer: Self-pay | Admitting: Licensed Clinical Social Worker

## 2022-05-10 NOTE — Telephone Encounter (Signed)
  Care Management   Follow Up Note   05/10/2022 Name: LEMAN MARTINEK MRN: [id number] DOB: 1956/04/19   Referred by: Riesa Pope, MD Reason for referral : No chief complaint on file.   An unsuccessful telephone outreach was attempted today. The patient was referred to the case management team for assistance with care management and care coordination.   Follow Up Plan: The care management team will reach out to the patient again over the next 30 days.   Lenor Derrick , MSW Social Worker [facility] Management  [phone]

## 2022-05-11 ENCOUNTER — Other Ambulatory Visit: Payer: Self-pay | Admitting: Student

## 2022-05-31 ENCOUNTER — Telehealth: Payer: Self-pay | Admitting: Licensed Clinical Social Worker

## 2022-05-31 ENCOUNTER — Telehealth: Payer: Medicare Other

## 2022-05-31 NOTE — Telephone Encounter (Signed)
  Care Management   Follow Up Note   05/31/2022 Name: Leonard Tucker MRN: [id number] DOB: October 12, 1956   Referred by: Riesa Pope, MD Reason for referral : No chief complaint on file.   An unsuccessful telephone outreach was attempted today. The patient was referred to the case management team for assistance with care management and care coordination.   SW left VM that she will attempt in the next 7-days. SW scheduled 2nd attempt for 07/07 at 10:30 am.  Follow Up Plan: The care management team will reach out to the patient again over the next 30 days.   Lenor Derrick, MSW  Social Worker [facility] Care Management  [phone]

## 2022-06-07 ENCOUNTER — Telehealth: Payer: Self-pay | Admitting: Licensed Clinical Social Worker

## 2022-06-07 ENCOUNTER — Telehealth: Payer: Medicare Other

## 2022-06-07 NOTE — Telephone Encounter (Signed)
  Care Management   Follow Up Note   06/07/2022 Name: Leonard Tucker MRN: [id number] DOB: 08-May-1956   Referred by: Riesa Pope, MD Reason for referral : No chief complaint on file.   A second unsuccessful telephone outreach was attempted today. The patient was referred to the case management team for assistance with care management and care coordination.   Follow Up Plan: The care management team will reach out to the patient again over the next 30 days.   Lenor Derrick ,MSW Social Worker [facility] Management  [phone]

## 2022-08-26 ENCOUNTER — Other Ambulatory Visit: Payer: Self-pay

## 2022-08-26 ENCOUNTER — Ambulatory Visit (INDEPENDENT_AMBULATORY_CARE_PROVIDER_SITE_OTHER): Payer: Medicare Other | Admitting: Internal Medicine

## 2022-08-26 ENCOUNTER — Encounter: Payer: Self-pay | Admitting: Internal Medicine

## 2022-08-26 VITALS — BP 111/70 | HR 110 | Temp 97.7°F | Ht 68.0 in | Wt 202.4 lb

## 2022-08-26 DIAGNOSIS — K219 Gastro-esophageal reflux disease without esophagitis: Secondary | ICD-10-CM

## 2022-08-26 DIAGNOSIS — L0231 Cutaneous abscess of buttock: Secondary | ICD-10-CM

## 2022-08-26 DIAGNOSIS — Z23 Encounter for immunization: Secondary | ICD-10-CM | POA: Diagnosis not present

## 2022-08-26 HISTORY — DX: Cutaneous abscess of buttock: L02.31

## 2022-08-26 MED ORDER — PANTOPRAZOLE SODIUM 40 MG PO TBEC: 40.0000 mg | DELAYED_RELEASE_TABLET | Freq: Two times a day (BID) | ORAL | 1 refills | Status: DC

## 2022-08-26 MED ORDER — SULFAMETHOXAZOLE-TRIMETHOPRIM 800-160 MG PO TABS: 1.0000 | ORAL_TABLET | Freq: Two times a day (BID) | ORAL | 0 refills | Status: DC

## 2022-08-26 NOTE — Assessment & Plan Note (Addendum)
Mother reports patient has been coughing more recently. Cough still predominately while lying down at night and just after rising. Improves during day. No additional changes to cough reported. Reports that Protonix did help with cough. He does not tolerate tablets well, prefers capsules, so he has not been taking Protonix as prescribed.  Chronic cough appears to be related to previously documented GERD. Limited to nighttime when patient is supine. No changes in respiratory status. Noted to improve with Protonix.  - advised patient resume taking Protonix. Resent Rx to pharmacy with note for capsule form if possible.

## 2022-08-26 NOTE — Assessment & Plan Note (Signed)
Seasonal flu shot administered

## 2022-08-26 NOTE — Assessment & Plan Note (Addendum)
Patient is mute. History provided by mother. She reports that she noticed abscess two days ago. No bites of inciting wounds that mother is able to recall. Reports that buttock initially more edematous with induration. Abscess appears to have spontaneously drained today. Mother reports induration and edema have improved some since she last checked. No fevers that she has noticed. Patient does not endorse pain. She has not been putting anything on abscess and patient has not been taking anything for it.  Physical exam findings appear to be resolving right buttock abscess of the skin. Central abscess appears to have ruptures spontaneously. There is some area of induration remaining. Patient is slightly tachycardic from vital signs obtained on intake, with normalization upon my exam. No other evidence of systemic signs. No fever, BP stable.  - bactrim twice daily for 5 - f/u 1 week if no improvement.

## 2022-08-26 NOTE — Progress Notes (Signed)
   CC: buttock abscess  HPI:[name] is a 66 y.o. male who presents for evaluation of buttock abscess. Please see individual problem based A/P for details.  Patient is mute. History provided by mother. She reports that she noticed abscess two days ago. No bites of inciting wounds that mother is able to recall. Reports that buttock initially more edematous with induration. Abscess appears to have spontaneously drained today. Mother reports induration and edema have improved some since she last checked. No fevers that she has noticed. Patient does not endorse pain. She has not been putting anything on abscess and patient has not been taking anything for it.   Mother reports patient has been coughing more recently. Cough still predominately while lying down at night and just after rising. Improves during day. No additional changes to cough reported. Reports that Protonix did help with cough. He does not tolerate tablets well, prefers capsules, so he has not been taking Protonix as prescribed.   Depression, PHQ-9: Based on the patients  [place] Visit from 04/15/2022 in [facility]  PHQ-9 Total Score 0      score we have 0.  Past Medical History:  Diagnosis Date   Chronic constipation    Deaf-mutism    Dental caries    History of iron deficiency anemia    Hypertriglyceridemia    Intertrigo    MR (mental retardation)    Schizophrenia, paranoid, chronic (HCC)    Umbilical hernia    Review of Systems:   ROS   Physical Exam: Vitals:   08/26/22 0935  BP: 111/70  Pulse: (!) 110  Temp: 97.7 F (36.5 C)  TempSrc: Oral  SpO2: 98%  Weight: 202 lb 6.4 oz (91.8 kg)  Height: '5\' 8"'$  (1.727 m)   General: NAD Cardiovascular: High-Normal rate, regular rhythm.  No murmurs, rubs, or gallops Pulmonary : Equal breath sounds, No wheezes, rales, or rhonchi, breathing comfortably on RA Abdominal: soft, nontender,  bowel sounds present Skin: 3cmx3cm area  of induration on right buttock with warmth. Central ruptured blister without drainage.   Assessment & Plan:   See Encounters Tab for problem based charting.  Right buttock abscess Physical exam findings appear to be resolving right buttock abscess of the skin. Central abscess appears to have ruptures spontaneously. There is some area of induration remaining. Patient is slightly tachycardic from vital signs obtained on intake, with normalization upon my exam. No other evidence of systemic signs. No fever, BP stable.  - bactrim twice daily for 5 - f/u 1 week if no improvement.  Chronic cough appears to be related to previously documented GERD. Limited to nighttime when patient is supine. No changes in respiratory status. Noted to improve with Protonix.  - advised patient resume taking Protonix. Resent Rx to pharmacy with note for capsule form if possible.   Patient discussed with Dr. Daryll Drown

## 2022-08-26 NOTE — Patient Instructions (Signed)
Dear Leonard Tucker,  Thank you for trusting [place] with your care today.   We evaluated you in the clinic for the abscess on your buttock and your cough. I have sent in a prescription for an antibiotic to help with the abscess. The cough is likely related to reflux symptoms. The pantoprazole should help control this. I have sent a note to the pharmacy to supply you with the capsule form instead.  Please call the front office to schedule an appointment if the buttock abscess is not improving in 1 week, or if your symptoms worsen.

## 2022-08-29 NOTE — Progress Notes (Signed)
Internal Medicine Clinic Attending  Case discussed with Dr. Gawaluck  at the time of the visit.  We reviewed the resident's history and exam and pertinent patient test results.  I agree with the assessment, diagnosis, and plan of care documented in the resident's note.  

## 2022-08-30 ENCOUNTER — Ambulatory Visit (INDEPENDENT_AMBULATORY_CARE_PROVIDER_SITE_OTHER): Payer: Medicare Other | Admitting: Internal Medicine

## 2022-08-30 VITALS — BP 98/63 | HR 88 | Temp 98.1°F | Ht 68.0 in | Wt 200.8 lb

## 2022-08-30 DIAGNOSIS — I10 Essential (primary) hypertension: Secondary | ICD-10-CM | POA: Diagnosis not present

## 2022-08-30 DIAGNOSIS — D500 Iron deficiency anemia secondary to blood loss (chronic): Secondary | ICD-10-CM

## 2022-08-30 DIAGNOSIS — K644 Residual hemorrhoidal skin tags: Secondary | ICD-10-CM

## 2022-08-30 DIAGNOSIS — L0231 Cutaneous abscess of buttock: Secondary | ICD-10-CM

## 2022-08-30 MED ORDER — SULFAMETHOXAZOLE-TRIMETHOPRIM 800-160 MG PO TABS: 1.0000 | ORAL_TABLET | Freq: Two times a day (BID) | ORAL | 0 refills | Status: AC

## 2022-08-30 NOTE — Progress Notes (Unsigned)
   CC: routine check-up  HPI:  Leonard Tucker is a 66 y.o. person with past medical history as detailed below who presents today for routine check-up. Please see problem based charting for detailed assessment and plan.  Past Medical History:  Diagnosis Date   Chronic constipation    Deaf-mutism    Dental caries    History of iron deficiency anemia    Hypertriglyceridemia    Intertrigo    MR (mental retardation)    Schizophrenia, paranoid, chronic (HCC)    Umbilical hernia    Review of Systems:  Negative unless otherwise stated.  Physical Exam:  Vitals:   08/30/22 0940  BP: 98/63  Pulse: 88  Temp: 98.1 F (36.7 C)  TempSrc: Oral  SpO2: 98%  Weight: 200 lb 12.8 oz (91.1 kg)  Height: '5\' 8"'$  (1.727 m)   Constitutional:Happy, well-appearing gentleman. In no acute distress. Cardio:Regular rate and rhythm. No murmurs, rubs, or gallops. Pulm:Clear to auscultation bilaterally. Normal work of breathing on room air. LXB:WIOMBTDH for extremity edema. Skin:Warm and dry.  The abscess site over the upper right buttocks has central granulation tissue and when pressure is applied, a combination of purulent and serosanguineous liquid drains. There is a large, roughly 5x5cm firm area beneath the open draining area that, as seen on ultrasound exam, does not contain significant fluid contents that could be drained. Neuro:Alert and oriented x3. No focal deficit noted. Psych:Pleasant mood and affect.   Assessment & Plan:   See Encounters Tab for problem based charting.  Essential hypertension BP controlled today, 98/63. Current regimen is atenolol 25 mg daily.  Plan:Continue current regimen.  External hemorrhoids Patient's mother denies significant hemorrhoidal bleeding or discomfort at this time. She currently applies an over-the-counter cream twice daily and gives [name] to help with constipation. Plan:Continue current regimen.  Abscess of buttock, right Patient currently  on his last day of Bactrim, one tablet of 5-day course remaining. He no longer has pain over the area of the abscess. His mom helps keep this clean with a fresh dressing applied daily. She denies active drainage from the lesion and he has not had a fever.  On exam, the abscess site does appear to have central granulation tissue and when pressure is applied, a combination of purulent and serosanguineous liquid drains. There is a large, roughly 5x5 cm firm area beneath the open draining area that, as seen on ultrasound exam, does not contain significant fluid contents that could be drained. Assessment:Abscess does not appear to be completely healed but compared to description from previous OV note, it does seem to have improved some with antibiotic therapy. Patient would benefit from extended course of antibiotics for this infection. Plan:Antibiotic extended for 5 additional days. Patient's mother advised to keep the area clean and dry, and to rinse with warm soapy water each day. She voices understanding. He has been advised to return to clinic if the wound does not show continued signs of healing, or if it begins to worsen.  Iron deficiency anemia due to chronic blood loss Plan:CBC collected at today's visit.   Patient discussed with Dr.  Saverio Danker

## 2022-08-30 NOTE — Patient Instructions (Signed)
Leonard Tucker,  It was a pleasure to meet you today!  I have sent in a few more days of your antibiotic for the spot on your buttocks. You will take 5 additional days of this medicine. Make sure to keep the area clean and dry, and wash over it daily with warm soapy water. Please contact the clinic to be seen again if it does not continue to get better.  I am going to check some blood work as well and will let you know what the results show.  Please plan to return to clinic in 3 months for regular check up, or sooner if needed.  My best, Dr. Marlou Sa

## 2022-08-31 LAB — CBC
Hematocrit: 37.8 % (ref 37.5–51.0)
Hemoglobin: 11.7 g/dL — ABNORMAL LOW (ref 13.0–17.7)
MCH: 22.1 pg — ABNORMAL LOW (ref 26.6–33.0)
MCHC: 31 g/dL — ABNORMAL LOW (ref 31.5–35.7)
MCV: 72 fL — ABNORMAL LOW (ref 79–97)
Platelets: 275 10*3/uL (ref 150–450)
RBC: 5.29 x10E6/uL (ref 4.14–5.80)
RDW: 15.3 % (ref 11.6–15.4)
WBC: 10.9 10*3/uL — ABNORMAL HIGH (ref 3.4–10.8)

## 2022-09-01 NOTE — Assessment & Plan Note (Signed)
BP controlled today, 98/63. Current regimen is atenolol 25 mg daily.  Plan:Continue current regimen.

## 2022-09-01 NOTE — Assessment & Plan Note (Signed)
Patient's mother denies significant hemorrhoidal bleeding or discomfort at this time. She currently applies an over-the-counter cream twice daily and gives [name] to help with constipation. Plan:Continue current regimen.

## 2022-09-01 NOTE — Assessment & Plan Note (Signed)
Plan:CBC collected at today's visit.

## 2022-09-01 NOTE — Assessment & Plan Note (Signed)
Patient currently on his last day of Bactrim, one tablet of 5-day course remaining. He no longer has pain over the area of the abscess. His mom helps keep this clean with a fresh dressing applied daily. She denies active drainage from the lesion and he has not had a fever.  On exam, the abscess site does appear to have central granulation tissue and when pressure is applied, a combination of purulent and serosanguineous liquid drains. There is a large, roughly 5x5 cm firm area beneath the open draining area that, as seen on ultrasound exam, does not contain significant fluid contents that could be drained. Assessment:Abscess does not appear to be completely healed but compared to description from previous OV note, it does seem to have improved some with antibiotic therapy. Patient would benefit from extended course of antibiotics for this infection. Plan:Antibiotic extended for 5 additional days. Patient's mother advised to keep the area clean and dry, and to rinse with warm soapy water each day. She voices understanding. He has been advised to return to clinic if the wound does not show continued signs of healing, or if it begins to worsen.

## 2022-09-03 NOTE — Progress Notes (Signed)
Internal Medicine Clinic Attending ? ?Case discussed with Dr. Dean  At the time of the visit.  We reviewed the resident?s history and exam and pertinent patient test results.  I agree with the assessment, diagnosis, and plan of care documented in the resident?s note.  ?

## 2022-09-05 ENCOUNTER — Telehealth: Payer: Self-pay | Admitting: Internal Medicine

## 2022-09-05 NOTE — Telephone Encounter (Signed)
Spoke with patient's sister, Leonard Tucker, and let her know that Leonard Tucker's lab results came back okay. She will relay this information to Leonard Tucker and his mother, Leonard Tucker. No questions at this time.  Farrel Gordon, DO

## 2022-10-26 ENCOUNTER — Other Ambulatory Visit: Payer: Self-pay | Admitting: Student

## 2022-10-26 DIAGNOSIS — I10 Essential (primary) hypertension: Secondary | ICD-10-CM

## 2022-10-26 DIAGNOSIS — K5900 Constipation, unspecified: Secondary | ICD-10-CM

## 2022-10-28 NOTE — Telephone Encounter (Signed)
Next appt scheduled 12/04/22 with PCP.

## 2022-11-02 DIAGNOSIS — F17200 Nicotine dependence, unspecified, uncomplicated: Secondary | ICD-10-CM | POA: Diagnosis not present

## 2022-11-02 DIAGNOSIS — R197 Diarrhea, unspecified: Secondary | ICD-10-CM | POA: Diagnosis not present

## 2022-11-02 DIAGNOSIS — R112 Nausea with vomiting, unspecified: Secondary | ICD-10-CM | POA: Diagnosis not present

## 2022-11-13 ENCOUNTER — Other Ambulatory Visit: Payer: Self-pay

## 2022-11-13 ENCOUNTER — Ambulatory Visit (INDEPENDENT_AMBULATORY_CARE_PROVIDER_SITE_OTHER): Payer: Medicare Other

## 2022-11-13 VITALS — BP 108/64 | HR 74 | Temp 97.7°F | Ht 66.0 in | Wt 204.7 lb

## 2022-11-13 DIAGNOSIS — D500 Iron deficiency anemia secondary to blood loss (chronic): Secondary | ICD-10-CM

## 2022-11-13 DIAGNOSIS — K644 Residual hemorrhoidal skin tags: Secondary | ICD-10-CM

## 2022-11-13 NOTE — Progress Notes (Unsigned)
   CC: hemorrhoids   HPI:  [name] is a 66 y.o. with past medical history as below who presents for evaluation of hemorrhoids.  Past Medical History:  Diagnosis Date   Chronic constipation    Deaf-mutism    Dental caries    History of iron deficiency anemia    Hypertriglyceridemia    Intertrigo    MR (mental retardation)    Schizophrenia, paranoid, chronic (HCC)    Umbilical hernia    Review of Systems:  See detailed assessment and plan for pertinent ROS.  Physical Exam:  Vitals:   11/13/22 1426  BP: 108/64  Pulse: 74  Temp: 97.7 F (36.5 C)  TempSrc: Oral  SpO2: 99%  Weight: 204 lb 11.2 oz (92.9 kg)  Height: '5\' 6"'$  (1.676 m)   Physical Exam Constitutional:      General: He is not in acute distress. HENT:     Head: Normocephalic and atraumatic.  Eyes:     Extraocular Movements: Extraocular movements intact.  Pulmonary:     Effort: Pulmonary effort is normal.  Genitourinary:    Comments: Multiple non bleeding external hemorrhoids which do not appear to be thrombosed. Nontender. Skin:    General: Skin is warm and dry.  Neurological:     Mental Status: He is alert. Mental status is at baseline.      Assessment & Plan:   See Encounters Tab for problem based charting.  Iron deficiency anemia due to chronic blood loss -     Iron and TIBC  External hemorrhoids Assessment & Plan: Patient's mother is caregiver. She reports long history of hemorrhoids and significant straining when stooling, consistent with constipation. He is still taking iron polysaccharides 150 mg daily for what is suspected to be chronic blood loss anemia. Concern for malignancy but patient has not been able to complete bowel prep on multiple attempts. Mother reports occasionally bleeding external hemorrhoids that she treats with daily Prep H cream. She inquires about further treatments such as Preparation H Tucks. She reports he also takes Miralax and a senna/docusate combo daily.  Endorses good hydration. Exam reveals nonbleeding, nontender, external hemorrhoids that do not appear to be thrombosed.   Plan: Will improve bowel regimen by adding metamucil. Patient was not getting adequate dose of sena/docute - discussed appropriate dosing as per OTC bottle they brought in. Encouraged good hydration while taking fiber. Recommended use of additional witch hazel/prep h pads if desired. Patient has follow up scheduled soon and if symptoms refractory, could consider referral to colorectal surgery for management.       Patient seen with Dr. Jimmye Norman

## 2022-11-13 NOTE — Patient Instructions (Signed)
[name], it was a pleasure seeing you today!  Today we discussed: External hemorrhoid - Please take senna /docusate combo two tablets each day. Start metamucil daily. Ensure good hydration while taking the metamucil. Continue Miralax. Will get iron panel and call back with results.  I have ordered the following labs today:  Lab Orders  No laboratory test(s) ordered today     Tests ordered today:  Iron panel  Referrals ordered today:   Referral Orders  No referral(s) requested today     I have ordered the following medication/changed the following medications:   Stop the following medications: There are no discontinued medications.   Start the following medications: No orders of the defined types were placed in this encounter.    Follow-up:  as scheduled in January    Please make sure to arrive 15 minutes prior to your next appointment. If you arrive late, you may be asked to reschedule.   We look forward to seeing you next time. Please call our clinic at [phone] if you have any questions or concerns. The best time to call is Monday-Friday from 9am-4pm, but there is someone available 24/7. If after hours or the weekend, call the main hospital number and ask for the Internal Medicine Resident On-Call. If you need medication refills, please notify your pharmacy one week in advance and they will send [place] a request.  Thank you for letting us take part in your care. Wishing you the best!  Thank you, Linward Natal, MD

## 2022-11-14 LAB — IRON AND TIBC
Iron Saturation: 13 % — ABNORMAL LOW (ref 15–55)
Iron: 53 ug/dL (ref 38–169)
Total Iron Binding Capacity: 412 ug/dL (ref 250–450)
UIBC: 359 ug/dL — ABNORMAL HIGH (ref 111–343)

## 2022-11-14 NOTE — Assessment & Plan Note (Signed)
Patient's mother is caregiver. She reports long history of hemorrhoids and significant straining when stooling, consistent with constipation. He is still taking iron polysaccharides 150 mg daily for what is suspected to be chronic blood loss anemia. Concern for malignancy but patient has not been able to complete bowel prep on multiple attempts. Mother reports occasionally bleeding external hemorrhoids that she treats with daily Prep H cream. She inquires about further treatments such as Preparation H Tucks. She reports he also takes Miralax and a senna/docusate combo daily. Endorses good hydration. Exam reveals nonbleeding, nontender, external hemorrhoids that do not appear to be thrombosed.   Plan: Will improve bowel regimen by adding metamucil. Patient was not getting adequate dose of sena/docute - discussed appropriate dosing as per OTC bottle they brought in. Encouraged good hydration while taking fiber. Recommended use of additional witch hazel/prep h pads if desired. Patient has follow up scheduled soon and if symptoms refractory, could consider referral to colorectal surgery for management.

## 2022-11-14 NOTE — Addendum Note (Signed)
Addended by: Linward Natal on: 11/14/2022 08:28 AM   Modules accepted: Orders

## 2022-11-15 ENCOUNTER — Ambulatory Visit: Payer: Self-pay | Admitting: Licensed Clinical Social Worker

## 2022-11-15 NOTE — Patient Outreach (Signed)
SW removed from Care Team.  Tracina Beaumont, BSW, MSW, LCSW-A  Social Worker [facility] Care Management  [phone] 

## 2022-11-19 ENCOUNTER — Telehealth: Payer: Self-pay | Admitting: *Deleted

## 2022-11-19 NOTE — Telephone Encounter (Signed)
Patient's sister returning call to Dr. Dema Severin for lab results. Please call sister at [phone].

## 2022-12-04 ENCOUNTER — Encounter: Payer: Medicare Other | Admitting: Student

## 2022-12-06 NOTE — Progress Notes (Signed)
Internal Medicine Clinic Attending  Case discussed with Dr. White  At the time of the visit.  We reviewed the resident's history and exam and pertinent patient test results.  I agree with the assessment, diagnosis, and plan of care documented in the resident's note.  

## 2022-12-09 ENCOUNTER — Encounter: Payer: Medicare Other | Admitting: Student

## 2022-12-23 ENCOUNTER — Ambulatory Visit (INDEPENDENT_AMBULATORY_CARE_PROVIDER_SITE_OTHER): Payer: 59 | Admitting: Student

## 2022-12-23 VITALS — BP 108/69 | HR 72 | Temp 98.8°F | Wt 204.7 lb

## 2022-12-23 DIAGNOSIS — D500 Iron deficiency anemia secondary to blood loss (chronic): Secondary | ICD-10-CM

## 2022-12-23 DIAGNOSIS — I10 Essential (primary) hypertension: Secondary | ICD-10-CM | POA: Diagnosis not present

## 2022-12-23 DIAGNOSIS — K644 Residual hemorrhoidal skin tags: Secondary | ICD-10-CM | POA: Diagnosis not present

## 2022-12-23 NOTE — Patient Instructions (Signed)
Thank you, Leonard Tucker for allowing [place] to provide your care today. Today we discussed your hemorrhoids and iron deficiency anemia.  Your blood pressure is stable.  I am glad your hemorrhoids are not bleeding as much.  My Chart Access: [email or web address]  Please follow-up in 2 months  Please make sure to arrive 15 minutes prior to your next appointment. If you arrive late, you may be asked to reschedule.    We look forward to seeing you next time. Please call our clinic at [phone] if you have any questions or concerns. The best time to call is Monday-Friday from 9am-4pm, but there is someone available 24/7. If after hours or the weekend, call the main hospital number and ask for the Internal Medicine Resident On-Call. If you need medication refills, please notify your pharmacy one week in advance and they will send [place] a request.   Thank you for letting us take part in your care. Wishing you the best!  Lacinda Axon, MD 12/23/2022, 4:15 PM IM Resident, PGY-3 Oswaldo Milian 41:10

## 2022-12-23 NOTE — Progress Notes (Signed)
   CC: Follow-up  HPI:  [name] is a 67 y.o. male with PMH as below who presents to clinic accompanied by mother to follow-up on his chronic medical problems. Please see problem based charting for evaluation, assessment and plan.   Past Medical History:  Diagnosis Date   Chronic constipation    Deaf-mutism    Dental caries    History of iron deficiency anemia    Hypertriglyceridemia    Intertrigo    MR (mental retardation)    Schizophrenia, paranoid, chronic (HCC)    Umbilical hernia     Review of Systems:  Constitutional: Negative for fever or fatigue Eyes: Negative for visual changes Respiratory: Negative for shortness of breath Cardiac: Negative for chest pain MSK: Negative for back pain Abdomen: Negative for abdominal pain, constipation or diarrhea Neuro: Negative for headache or weakness  Physical Exam: General: Pleasant, well-appearing deaf middle-age man. No acute distress. Playful in the room. Cardiac: RRR. No murmurs, rubs or gallops. No LE edema Respiratory: Lungs CTAB. No wheezing or crackles. Abdominal: Soft, symmetric and non tender. Normal BS. Skin: Warm, dry and intact without rashes or lesions GU: Rectum with multiple nontender external hemorrhoids without bleeding. Extremities: Atraumatic.  Radial and DP pulses symmetric 2+.   Vitals:   12/23/22 1445 12/23/22 1551  BP: 99/67 108/69  Pulse: 78 72  Temp: 98.8 F (37.1 C)   TempSrc: Oral   SpO2: 96%   Weight: 204 lb 11.2 oz (92.9 kg)      Assessment & Plan:   Essential hypertension Blood pressure remains well-controlled with SBP in the 90s to 100s.  Vitals:   12/23/22 1445 12/23/22 1551  BP: 99/67 108/69   Plan: -Continue atenolol 25 mg daily -Continue on triamterene-HCTZ 37.5-25 mg daily -Follow-up in 2 to 3 months for repeat BP and BMP  Iron deficiency anemia due to chronic blood loss Per mother, patient has not complained of any dizziness.  The bleeding from his hemorrhoids  has improved.  Iron studies obtained during last week's visit a month ago.  Last CBC 3 months ago showed stable hemoglobin of 11.7. Currently taking iron supplementation every other day per mother.   Plan: -Continue iron polysaccharide 150 mg every other day -Repeat CBC and iron studies at next office visit  External hemorrhoids According to mother, patient has been straining less frequently after adding Metamucil to his bowel regimen.  He has had decreased bleeding from the hemorrhoids in the last month. Mother reports that she is given patient his iron supplements every other day now. On exam, patient has multiple nontender external hemorrhoids that are not thrombosed.  Plan: -General bowel regiment with MiraLAX, Senokot-s and Metamucil. -Continue PRN Anusol 2.5% rectal cream    See Encounters Tab for problem based charting.  Patient discussed with Dr.  Dani Gobble, MD, MPH

## 2022-12-24 ENCOUNTER — Encounter: Payer: Self-pay | Admitting: Student

## 2022-12-24 MED ORDER — POLYSACCHARIDE IRON COMPLEX 150 MG PO CAPS: ORAL_CAPSULE | ORAL | 1 refills | Status: DC

## 2022-12-24 NOTE — Assessment & Plan Note (Addendum)
Blood pressure remains well-controlled with SBP in the 90s to 100s.  Vitals:   12/23/22 1445 12/23/22 1551  BP: 99/67 108/69   Plan: -Continue atenolol 25 mg daily -Continue on triamterene-HCTZ 37.5-25 mg daily -Follow-up in 2 to 3 months for repeat BP and BMP

## 2022-12-24 NOTE — Assessment & Plan Note (Signed)
According to mother, patient has been straining less frequently after adding Metamucil to his bowel regimen.  He has had decreased bleeding from the hemorrhoids in the last month. Mother reports that she is given patient his iron supplements every other day now. On exam, patient has multiple nontender external hemorrhoids that are not thrombosed.  Plan: -General bowel regiment with MiraLAX, Senokot-s and Metamucil. -Continue PRN Anusol 2.5% rectal cream

## 2022-12-24 NOTE — Progress Notes (Signed)
Internal Medicine Clinic Attending  Case discussed with Dr. Amponsah  At the time of the visit.  We reviewed the resident's history and exam and pertinent patient test results.  I agree with the assessment, diagnosis, and plan of care documented in the resident's note.  

## 2022-12-24 NOTE — Assessment & Plan Note (Signed)
Per mother, patient has not complained of any dizziness.  The bleeding from his hemorrhoids has improved.  Iron studies obtained during last week's visit a month ago.  Last CBC 3 months ago showed stable hemoglobin of 11.7. Currently taking iron supplementation every other day per mother.   Plan: -Continue iron polysaccharide 150 mg every other day -Repeat CBC and iron studies at next office visit

## 2022-12-31 ENCOUNTER — Ambulatory Visit (INDEPENDENT_AMBULATORY_CARE_PROVIDER_SITE_OTHER): Payer: 59 | Admitting: Student

## 2022-12-31 ENCOUNTER — Encounter: Payer: Self-pay | Admitting: Gastroenterology

## 2022-12-31 VITALS — BP 114/72 | HR 101 | Temp 98.2°F | Ht 66.0 in | Wt 201.1 lb

## 2022-12-31 DIAGNOSIS — D500 Iron deficiency anemia secondary to blood loss (chronic): Secondary | ICD-10-CM

## 2022-12-31 DIAGNOSIS — K219 Gastro-esophageal reflux disease without esophagitis: Secondary | ICD-10-CM

## 2022-12-31 DIAGNOSIS — Z8719 Personal history of other diseases of the digestive system: Secondary | ICD-10-CM

## 2022-12-31 NOTE — Patient Instructions (Addendum)
Please call GI   [facility] Gastroenterologist in [place], [place] Address: [address], [place], [place] [address] Phone: [phone]  Patient will need a colonoscopy and possible endoscopy.    Continue with the protonix.  We will set up an iron infusion for you as well. Please stop taking the pill iron.

## 2023-01-01 ENCOUNTER — Encounter: Payer: Self-pay | Admitting: Student

## 2023-01-01 DIAGNOSIS — Z8719 Personal history of other diseases of the digestive system: Secondary | ICD-10-CM

## 2023-01-01 HISTORY — DX: Personal history of other diseases of the digestive system: Z87.19

## 2023-01-01 NOTE — Progress Notes (Signed)
   CC: N/V   HPI:  [name] is a 67 y.o. F with Pmh per below who presents for N/V  and loose stools over the last couple of days. History is primarily provided by patient's sister and mother who accompanied him to his appointment.   About 2d prior to today's clinic visit he had a large meal and immediately went to bed. He got nauseous and had one episode of non bloody emesis. His appetite was poor the following day and he had only some soup which he tolerated without emesis. He also had 3 loose stools over this time period. He has had no sick contacts. Of note, patient does have poorly controlled reflux and his sister feels that this current episode is related to his GERD. He has had no fevers, chills, melena, or hematochezia.    Past Medical History:  Diagnosis Date   Abscess of buttock, right 08/26/2022   Chronic constipation    Deaf-mutism    Dental caries    History of iron deficiency anemia    Hypertriglyceridemia    Intellectual disability    Intertrigo    Schizophrenia, paranoid, chronic ([place])    Umbilical hernia    Review of Systems:  Negative except per above.   Physical Exam:  Vitals:   12/31/22 1420  BP: 114/72  Pulse: (!) 101  Temp: 98.2 F (36.8 C)  TempSrc: Oral  SpO2: 99%  Weight: 201 lb 1.6 oz (91.2 kg)  Height: '5\' 6"'$  (1.676 m)    Constitutional: Well-developed, well-nourished, and in no distress.  Cardiovascular: Normal rate, regular rhythm, intact distal pulses. No gallop and no friction rub.  No murmur heard. No lower extremity edema  Pulmonary: Non labored breathing on room air, no wheezing or rales  Abdominal: Soft. Normal bowel sounds. Non distended and non tender Musculoskeletal: Normal range of motion.        General: No tenderness or edema.  Neurological: Alert, able to use sign language with mom and sister Skin: Skin is warm and dry.    Assessment & Plan:   See Encounters Tab for problem based charting.  Patient discussed with  Dr. Heber [place]

## 2023-01-01 NOTE — Assessment & Plan Note (Addendum)
Continues to have poorly controlled reflux. Suspect this is the etiology of his N/V. Low suspicion for infectious etiology. His mother is intermittently giving him protonix. She agrees to give him his protonix twice daily. In addition, patient will see GI 2/29.

## 2023-01-01 NOTE — Assessment & Plan Note (Signed)
Patient hospitalized for this 03/2022. Unclear exact etiology. He was to get a colonoscopy but unfortunately was not able to get this set up. (His family missed the phone calls to set this up).   Patient will see Gi 2/29

## 2023-01-01 NOTE — Assessment & Plan Note (Signed)
Thought to be 2/2 to external hemorrhoids, but he has never had a colonoscopy. He is currently on PO iron supplementation. Because he has constipation with PO iron and his hemorrhoidal disease will transition patient to IV iron supplementation. Deficit is ~'800mg'$  iron.  -IV feraheme '510mg'$  x2 -Patient has not had a colonoscopy. After his hospitalization for SBO in 03/2022 a referral was sent but patient's family missed phone calls from the GI team. Patient's sister did call [facility] and patient is scheduled for appointment 2/29

## 2023-01-02 ENCOUNTER — Telehealth: Payer: Self-pay | Admitting: *Deleted

## 2023-01-02 NOTE — Telephone Encounter (Signed)
Appointment Information  Name: Leonard Tucker, Leonard Tucker MRN: [id number]  Date: 01/09/2023 Status: Sch  Time: 12:00 PM Length: 60  Visit Type: Shirlean Kelly [1897] Copay: $0.00  Provider: [id number] Department: [facility]  Referring Provider: Riesa Pope CSN: [id number]  Notes: IV Feraheme #1 of 2  Made On: 01/02/2023 8:29 AM By: Laverle Patter    Pt's family aware.Despina Hidden Cassady2/1/20241:32 PM

## 2023-01-06 NOTE — Progress Notes (Signed)
Internal Medicine Clinic Attending  Case discussed with the resident at the time of the visit.  We reviewed the resident's history and exam and pertinent patient test results.  I agree with the assessment, diagnosis, and plan of care documented in the resident's note.  

## 2023-01-08 ENCOUNTER — Ambulatory Visit (INDEPENDENT_AMBULATORY_CARE_PROVIDER_SITE_OTHER): Payer: 59 | Admitting: Student

## 2023-01-08 ENCOUNTER — Telehealth: Payer: Self-pay | Admitting: *Deleted

## 2023-01-08 DIAGNOSIS — J069 Acute upper respiratory infection, unspecified: Secondary | ICD-10-CM

## 2023-01-08 NOTE — Telephone Encounter (Signed)
Patient's sister called in stating patient has had 2 day hx of runny nose, watery eyes, and cough productive of white sputum. Patient is allergic to guaifenesin and dextromethorphan Tele appt given this PM with PCP.

## 2023-01-09 ENCOUNTER — Inpatient Hospital Stay (HOSPITAL_COMMUNITY): Admission: RE | Admit: 2023-01-09 | Payer: 59 | Source: Ambulatory Visit

## 2023-01-09 ENCOUNTER — Ambulatory Visit (INDEPENDENT_AMBULATORY_CARE_PROVIDER_SITE_OTHER): Payer: 59 | Admitting: Internal Medicine

## 2023-01-09 ENCOUNTER — Telehealth: Payer: Self-pay

## 2023-01-09 ENCOUNTER — Encounter: Payer: Self-pay | Admitting: Internal Medicine

## 2023-01-09 VITALS — BP 126/74 | HR 83 | Temp 97.7°F | Wt 206.8 lb

## 2023-01-09 DIAGNOSIS — K5900 Constipation, unspecified: Secondary | ICD-10-CM

## 2023-01-09 DIAGNOSIS — K644 Residual hemorrhoidal skin tags: Secondary | ICD-10-CM

## 2023-01-09 DIAGNOSIS — H5462 Unqualified visual loss, left eye, normal vision right eye: Secondary | ICD-10-CM

## 2023-01-09 MED ORDER — SENNOSIDES-DOCUSATE SODIUM 8.6-50 MG PO TABS: 1.0000 | ORAL_TABLET | Freq: Every day | ORAL | 2 refills | Status: AC

## 2023-01-09 MED ORDER — PHENYLEPH-SHARK LIV OIL-MO-PET 0.25-3-14-71.9 % RE OINT: 1.0000 | TOPICAL_OINTMENT | Freq: Two times a day (BID) | RECTAL | 0 refills | Status: DC | PRN

## 2023-01-09 NOTE — Progress Notes (Signed)
Patient scheduled for telehealth as not feeling well. With his history of hearing impairment, called his sister Clarita Crane. Patient's mother is also sick and unable to call.   Leonard Tucker notes that patient is feeling much better just having a cough a night that is improving. Will no charge visit as patient feeling better.

## 2023-01-09 NOTE — Telephone Encounter (Addendum)
Patient sister [name] called she stated patient is starting to bleed again from his rectum and a hemorrhoid is hanging out. Please return Sabrinas call.

## 2023-01-09 NOTE — Patient Instructions (Addendum)
[name], it was a pleasure seeing you today! You endorsed feeling well today. Below are some of the things we talked about this visit. We look forward to seeing you in the follow up appointment!  Today we discussed: I have included foods to eat to prevent constipation. I am also given you a cream for the hemorrhoids.  Follow up with GI doctor on 01/30/23. Please take only senna kot S for your constipation. Continue taking all your other medicines but hold on taking the miralax or metamucil.   I have ordered the following labs today:  Lab Orders  No laboratory test(s) ordered today      Referrals ordered today:   Referral Orders  No referral(s) requested today     I have ordered the following medication/changed the following medications:   Stop the following medications: There are no discontinued medications.   Start the following medications: Meds ordered this encounter  Medications   phenylephrine-shark liver oil-mineral oil-petrolatum (PREPARATION H) 0.25-3-14-71.9 % rectal ointment    Sig: Place 1 Application rectally 2 (two) times daily as needed for hemorrhoids.    Dispense:  30 g    Refill:  0     Follow-up: Follow up with Dr. Milford Cage   Please make sure to arrive 15 minutes prior to your next appointment. If you arrive late, you may be asked to reschedule.   We look forward to seeing you next time. Please call our clinic at [phone] if you have any questions or concerns. The best time to call is Monday-Friday from 9am-4pm, but there is someone available 24/7. If after hours or the weekend, call the main hospital number and ask for the Internal Medicine Resident On-Call. If you need medication refills, please notify your pharmacy one week in advance and they will send [place] a request.  Thank you for letting us take part in your care. Wishing you the best!  Thank you, Idamae Schuller, MD

## 2023-01-09 NOTE — Telephone Encounter (Addendum)
Called pt's sister - she stated someone had called her back and pt was given an appt today. Per chart pt has an appt today w/Dr GCYO@ 1445 PM.

## 2023-01-09 NOTE — Progress Notes (Signed)
CC: rectal bleeding  HPI:  [name] is a 67 y.o. with medical history of schizoprenia, deafness-mutism, hx of hemorrhoids 2/2 to persistent constipation presenting to [facility] for rectal bleeding.  Please see problem-based list for further details, assessments, and plans.  Past Medical History:  Diagnosis Date   Abscess of buttock, right 08/26/2022   Chronic constipation    Deaf-mutism    Dental caries    History of iron deficiency anemia    Hypertriglyceridemia    Intellectual disability    Intertrigo    Schizophrenia, paranoid, chronic (HCC)    Umbilical hernia      Current Outpatient Medications (Cardiovascular):    atenolol (TENORMIN) 25 MG tablet, TAKE 1 TABLET BY MOUTH EVERY DAY   pravastatin (PRAVACHOL) 40 MG tablet, Take 1 tablet (40 mg total) by mouth daily.   triamterene-hydrochlorothiazide (DYAZIDE) 37.5-25 MG capsule, Take 1 each (1 capsule total) by mouth daily.  Current Outpatient Medications (Respiratory):    fluticasone (FLONASE) 50 MCG/ACT nasal spray, SPRAY 2 SPRAYS INTO EACH NOSTRIL EVERY DAY (Patient taking differently: 2 sprays daily as needed for allergies or rhinitis.)  Current Outpatient Medications (Analgesics):    acetaminophen (TYLENOL) 650 MG CR tablet, Take 650 mg by mouth daily as needed for pain.   Current Outpatient Medications (Other):    phenylephrine-shark liver oil-mineral oil-petrolatum (PREPARATION H) 0.25-3-14-71.9 % rectal ointment, Place 1 Application rectally 2 (two) times daily as needed for hemorrhoids.   fluPHENAZine decanoate (PROLIXIN) 25 MG/ML injection, Inject 32.5 mg into the muscle every 21 ( twenty-one) days. Administered at [facility] in [place]   hydrocortisone (ANUSOL-HC) 2.5 % rectal cream, Apply rectally 2 times daily as needed.  Do not use for more than 1 week at a time. (Patient taking differently: Place 1 application. rectally 2 (two) times daily as needed for hemorrhoids. Do not use for more than 1 week at a  time.)   pantoprazole (PROTONIX) 40 MG tablet, Take 1 tablet (40 mg total) by mouth 2 (two) times daily.   senna-docusate (SENOKOT-S) 8.6-50 MG tablet, Take 1-2 tablets by mouth at bedtime.  Review of Systems:  Review of system negative unless stated in the problem list or HPI.    Physical Exam:  Vitals:   01/09/23 1448  BP: 126/74  Pulse: 83  Temp: 97.7 F (36.5 C)  TempSrc: Oral  SpO2: 99%  Weight: 206 lb 12.8 oz (93.8 kg)    Physical Exam General: NAD HENT: left eye with white pupil and significantly visual acuity Lungs: CTAB, no wheeze, rhonchi or rales.  Cardiovascular: Normal heart sounds, no r/m/g, 2+ pulses in all extremities. No LE edema Abdomen: No TTP, normal bowel sounds GU: Rectal exam shows external hemorrhoids, no impaction and no residual blood noted on rectal vault.  MSK: No asymmetry or muscle atrophy.  Skin: no lesions noted on exposed skin Neuro: Alert and oriented x4. CN grossly intact Psych: Normal mood and normal affect   Assessment & Plan:   External hemorrhoids Patient presented with intermittent rectal bleeding. This is a known issue for this patient for the last 3 years. Patient requires assistance of his family for communication given his medical history. They report patient's rectal bleeding is worsened if he gets constipated. He has a tendency to strain. Last time he had rectal bleeding was 3 days ago. Advised family diet that can minimize constipation and gave a prescription for Preparation H. Pt has colonoscopy coming up on 01/30/23. Will defer CBC given the bleeding is intermittent  and does not appear patient is not having significant blood loss.   Blindness of left eye Pt presenting with left eye blindness that is chronic. He is not having any other visual symptoms including no pain, redness or discharge. This is not an acute issue for the patient but pt would benefit from getting this worked up.  -Referral to ophthalmology for further  evaluation.   Constipation Pt's family currently endorses using intermittent senna, miralax and metamucil. They state this helps the patient but the constipation comes back depending on what he eats. I believe pt will benefit from regimen simplification to include only senna-s scheduled. I provided the family with dietary recommendation to minimize constipation.    See Encounters Tab for problem based charting.  Patient discussed with Dr. Garald Balding, MD Tillie Rung. [facility] Internal Medicine Residency, PGY-2

## 2023-01-09 NOTE — Progress Notes (Signed)
Agree with no charge for this call.

## 2023-01-10 DIAGNOSIS — H5440 Blindness, one eye, unspecified eye: Secondary | ICD-10-CM | POA: Insufficient documentation

## 2023-01-10 NOTE — Assessment & Plan Note (Signed)
Pt presenting with left eye blindness that is chronic. He is not having any other visual symptoms including no pain, redness or discharge. This is not an acute issue for the patient but pt would benefit from getting this worked up.  -Referral to ophthalmology for further evaluation.

## 2023-01-10 NOTE — Assessment & Plan Note (Signed)
Patient presented with intermittent rectal bleeding. This is a known issue for this patient for the last 3 years. Patient requires assistance of his family for communication given his medical history. They report patient's rectal bleeding is worsened if he gets constipated. He has a tendency to strain. Last time he had rectal bleeding was 3 days ago. Advised family diet that can minimize constipation and gave a prescription for Preparation H. Pt has colonoscopy coming up on 01/30/23. Will defer CBC given the bleeding is intermittent and does not appear patient is not having significant blood loss.

## 2023-01-10 NOTE — Assessment & Plan Note (Signed)
Pt's family currently endorses using intermittent senna, miralax and metamucil. They state this helps the patient but the constipation comes back depending on what he eats. I believe pt will benefit from regimen simplification to include only senna-s scheduled. I provided the family with dietary recommendation to minimize constipation.

## 2023-01-13 NOTE — Progress Notes (Signed)
Internal Medicine Clinic Attending  Case discussed with Dr. Jinwala  At the time of the visit.  We reviewed the resident's history and exam and pertinent patient test results.  I agree with the assessment, diagnosis, and plan of care documented in the resident's note.  

## 2023-01-16 ENCOUNTER — Encounter (HOSPITAL_COMMUNITY)
Admission: RE | Admit: 2023-01-16 | Discharge: 2023-01-16 | Disposition: A | Discharge status: Home / Self Care | Payer: 59 | Source: Ambulatory Visit | Attending: Internal Medicine | Admitting: Internal Medicine

## 2023-01-16 ENCOUNTER — Inpatient Hospital Stay (HOSPITAL_COMMUNITY): Admission: RE | Admit: 2023-01-16 | Payer: 59 | Source: Ambulatory Visit

## 2023-01-16 DIAGNOSIS — D500 Iron deficiency anemia secondary to blood loss (chronic): Secondary | ICD-10-CM | POA: Insufficient documentation

## 2023-01-16 MED ORDER — SODIUM CHLORIDE 0.9 % IV SOLN
510.0000 mg | INTRAVENOUS | Status: DC
  Administered 2023-01-16: 510 mg via INTRAVENOUS
  Filled 2023-01-16: qty 17

## 2023-01-23 ENCOUNTER — Encounter (HOSPITAL_COMMUNITY)
Admission: RE | Admit: 2023-01-23 | Discharge: 2023-01-23 | Disposition: A | Discharge status: Home / Self Care | Payer: 59 | Source: Ambulatory Visit | Attending: Internal Medicine | Admitting: Internal Medicine

## 2023-01-23 DIAGNOSIS — D500 Iron deficiency anemia secondary to blood loss (chronic): Secondary | ICD-10-CM

## 2023-01-23 MED ORDER — SODIUM CHLORIDE 0.9 % IV SOLN
510.0000 mg | INTRAVENOUS | Status: DC
  Administered 2023-01-23: 510 mg via INTRAVENOUS
  Filled 2023-01-23: qty 510

## 2023-01-30 ENCOUNTER — Encounter: Payer: Self-pay | Admitting: Gastroenterology

## 2023-01-30 ENCOUNTER — Encounter (HOSPITAL_COMMUNITY): Payer: 59

## 2023-01-30 ENCOUNTER — Ambulatory Visit (INDEPENDENT_AMBULATORY_CARE_PROVIDER_SITE_OTHER): Payer: 59 | Admitting: Gastroenterology

## 2023-01-30 ENCOUNTER — Other Ambulatory Visit (INDEPENDENT_AMBULATORY_CARE_PROVIDER_SITE_OTHER): Payer: 59

## 2023-01-30 VITALS — BP 120/80 | HR 84 | Ht 66.0 in | Wt 201.8 lb

## 2023-01-30 DIAGNOSIS — K5909 Other constipation: Secondary | ICD-10-CM | POA: Diagnosis not present

## 2023-01-30 DIAGNOSIS — K625 Hemorrhage of anus and rectum: Secondary | ICD-10-CM | POA: Diagnosis not present

## 2023-01-30 DIAGNOSIS — D509 Iron deficiency anemia, unspecified: Secondary | ICD-10-CM

## 2023-01-30 DIAGNOSIS — Z8719 Personal history of other diseases of the digestive system: Secondary | ICD-10-CM | POA: Diagnosis not present

## 2023-01-30 LAB — IBC + FERRITIN
Ferritin: 455 ng/mL — ABNORMAL HIGH (ref 22.0–322.0)
Iron: 77 ug/dL (ref 42–165)
Saturation Ratios: 20.9 % (ref 20.0–50.0)
TIBC: 368.2 ug/dL (ref 250.0–450.0)
Transferrin: 263 mg/dL (ref 212.0–360.0)

## 2023-01-30 LAB — COMPREHENSIVE METABOLIC PANEL
ALT: 28 U/L (ref 0–53)
AST: 20 U/L (ref 0–37)
Albumin: 4.2 g/dL (ref 3.5–5.2)
Alkaline Phosphatase: 73 U/L (ref 39–117)
BUN: 12 mg/dL (ref 6–23)
CO2: 29 mEq/L (ref 19–32)
Calcium: 9.6 mg/dL (ref 8.4–10.5)
Chloride: 98 mEq/L (ref 96–112)
Creatinine, Ser: 1.13 mg/dL (ref 0.40–1.50)
GFR: 68.48 mL/min (ref >60.00)
Glucose, Bld: 116 mg/dL — ABNORMAL HIGH (ref 70–99)
Potassium: 3.3 mEq/L — ABNORMAL LOW (ref 3.5–5.1)
Sodium: 139 mEq/L (ref 135–145)
Total Bilirubin: 0.3 mg/dL (ref 0.2–1.2)
Total Protein: 8 g/dL (ref 6.0–8.3)

## 2023-01-30 LAB — CBC WITH DIFFERENTIAL/PLATELET
Basophils Absolute: 0.1 10*3/uL (ref 0.0–0.1)
Basophils Relative: 0.7 % (ref 0.0–3.0)
Eosinophils Absolute: 0.2 10*3/uL (ref 0.0–0.7)
Eosinophils Relative: 2.5 % (ref 0.0–5.0)
HCT: 37.5 % — ABNORMAL LOW (ref 39.0–52.0)
Hemoglobin: 11.7 g/dL — ABNORMAL LOW (ref 13.0–17.0)
Lymphocytes Relative: 28.1 % (ref 12.0–46.0)
Lymphs Abs: 2.5 10*3/uL (ref 0.7–4.0)
MCHC: 31.2 g/dL (ref 30.0–36.0)
MCV: 71.6 fl — ABNORMAL LOW (ref 78.0–100.0)
Monocytes Absolute: 1.2 10*3/uL — ABNORMAL HIGH (ref 0.1–1.0)
Monocytes Relative: 14.2 % — ABNORMAL HIGH (ref 3.0–12.0)
Neutro Abs: 4.8 10*3/uL (ref 1.4–7.7)
Neutrophils Relative %: 54.5 % (ref 43.0–77.0)
Platelets: 220 10*3/uL (ref 150.0–400.0)
RBC: 5.24 Mil/uL (ref 4.22–5.81)
RDW: 20.5 % — ABNORMAL HIGH (ref 11.5–15.5)
WBC: 8.8 10*3/uL (ref 4.0–10.5)

## 2023-01-30 MED ORDER — PEG 3350-KCL-NA BICARB-NACL 420 G PO SOLR: 4000.0000 mL | Freq: Once | ORAL | 0 refills | Status: AC

## 2023-01-30 NOTE — Patient Instructions (Signed)
_______________________________________________________  If your blood pressure at your visit was 140/90 or greater, please contact your primary care physician to follow up on this.  _______________________________________________________  If you are age 65 or older, your body mass index should be between 23-30. Your Body mass index is 32.57 kg/m. If this is out of the aforementioned range listed, please consider follow up with your Primary Care Provider.  If you are age 41 or younger, your body mass index should be between 19-25. Your Body mass index is 32.57 kg/m. If this is out of the aformentioned range listed, please consider follow up with your Primary Care Provider.   ________________________________________________________  The [facility] providers would like to encourage you to use [facility] to communicate with providers for non-urgent requests or questions.  Due to long hold times on the telephone, sending your provider a message by [facility] may be a faster and more efficient way to get a response.  Please allow 48 business hours for a response.  Please remember that this is for non-urgent requests.  _______________________________________________________  Dennis Bast have been scheduled for an endoscopy and colonoscopy. Please follow the written instructions given to you at your visit today. Please pick up your prep supplies at the pharmacy within the next 1-3 days. If you use inhalers (even only as needed), please bring them with you on the day of your procedure.  Your provider has requested that you go to the basement level for lab work before leaving today. Press "B" on the elevator. The lab is located at the first door on the left as you exit the elevator.  Due to recent changes in healthcare laws, you may see the results of your imaging and laboratory studies on MyChart before your provider has had a chance to review them.  We understand that in some cases there may be results that are  confusing or concerning to you. Not all laboratory results come back in the same time frame and the provider may be waiting for multiple results in order to interpret others.  Please give [place] 48 hours in order for your provider to thoroughly review all the results before contacting the office for clarification of your results.    It was a pleasure to see you today!  Thank you for trusting me with your gastrointestinal care!

## 2023-01-30 NOTE — Progress Notes (Addendum)
[facility] Consult Note:  History: Leonard Tucker 01/30/2023  Referring provider: Riesa Pope, MD  Reason for consult/chief complaint: Abdominal Pain (" Complains of stomach hurting sometimes" Per the sister. ) and Gastroesophageal Reflux (Patient currently taking Pantoprazole 40 mg BID)   Subjective  HPI: Patient was previously seen in 2015 and 2018 by Dr. Arta Silence for constipation, blood in stool, and IDA. He had a colonoscopy on Colonoscopy 02/22/2014 with Dr. Paulita Fujita. These records are not available in EPIC. He was recently seen by Dr. Idamae Schuller at [facility] on 01/09/23 wherein he complained of persistent constipation and rectal bleeding x3 years. He has a Hx of hemorrhoids. Per review of this note, pt's family noted that he was using senna, miralax, and metamucil intermittently. They noted dietary triggers for his constipation and were provided with dietary recommendation by Dr. Humphrey Rolls at that time. He was also provided with Prep H ointment and Senokot S at the time of that visit. Dr. Humphrey Rolls noted external hemorrhoids without stool impaction or blood on his physical exam.  Patient was hospitalized on 03/18/22 for an SBO. He had NG tube placement and Gastrografin with SBO resolution during his stay. His CT at that time showed pneumatosis in the gastric body and portal veins. He was also found to have IDA.   He has since started PO iron. He had 2 feraheme infusions with the last on 01/23/23. He has not been evaluated by hematology, and this has been arranged by his PCP.  Patient has a PMHx of schizophrenia and deafness-mutism. His family accompanied him today to provide history of present illness.  Patient's family states that his Miralax as d/c due to concerns for possible complications with his other medications.  Patient does not have any abdominal pain at this time. His main complaint is rectal bleeding and constipation. His family notes that he has a great  appetite and can eat most foods. Denies any dysphagia.   He has not had any prior abdominal surgeries. He denies any vomiting.  He lives at home with his mother. He primarily communicates with ASL.   ROS: Review of Systems  Constitutional:  Negative for appetite change and fever.  HENT:  Negative for trouble swallowing.   Respiratory:  Negative for cough and shortness of breath.   Cardiovascular:  Negative for chest pain.  Gastrointestinal:  Positive for abdominal distention, anal bleeding, blood in stool and constipation. Negative for abdominal pain, diarrhea, nausea, rectal pain and vomiting.  Genitourinary:  Negative for dysuria.  Musculoskeletal:  Negative for back pain.  Skin:  Negative for rash.  Neurological:  Negative for weakness.  All other systems reviewed and are negative.  This review of system was obtained through his sister and mother who accompanied him today.  They are able to communicate with him in sign language, and they also observed him frequently since he lives with the mother.  Past Medical History: Past Medical History:  Diagnosis Date   Abscess of buttock, right 08/26/2022   Chronic constipation    Deaf-mutism    Dental caries    History of iron deficiency anemia    Hypertriglyceridemia    Intellectual disability    Intertrigo    Schizophrenia, paranoid, chronic ([place])    Umbilical hernia      Past Surgical History: History reviewed. No pertinent surgical history.   Family History: Family History  Problem Relation Age of Onset   Hypertension Mother    Diabetes Maternal Aunt  Social History: Social History   Socioeconomic History   Marital status: Single    Spouse name: Not on file   Number of children: Not on file   Years of education: Not on file   Highest education level: Not on file  Occupational History   Not on file  Tobacco Use   Smoking status: Never   Smokeless tobacco: Never  Substance and Sexual Activity   Alcohol  use: No    Alcohol/week: 0.0 standard drinks of alcohol   Drug use: No   Sexual activity: Not on file  Other Topics Concern   Not on file  Social History Narrative   His mother, Leonard Tucker is his primary caretaker and he lives with her.    Communicates via sign language          Social Determinants of Health   Financial Resource Strain: Not on file  Food Insecurity: No Food Insecurity (11/13/2022)   Hunger Vital Sign    Worried About Running Out of Food in the Last Year: Never true    Ran Out of Food in the Last Year: Never true  Transportation Needs: No Transportation Needs (11/13/2022)   PRAPARE - Hydrologist (Medical): No    Lack of Transportation (Non-Medical): No  Physical Activity: Not on file  Stress: Not on file  Social Connections: Socially Isolated (11/13/2022)   Social Connection and Isolation Panel [NHANES]    Frequency of Communication with Friends and Family: More than three times a week    Frequency of Social Gatherings with Friends and Family: More than three times a week    Attends Religious Services: Never    Marine scientist or Organizations: No    Attends Archivist Meetings: Never    Marital Status: Never married    Allergies: Allergies  Allergen Reactions   Chlorpheniramine-Dm Nausea And Vomiting   Robitussin Dm [Dextromethorphan-Guaifenesin] Other (See Comments)    "Acted out more than normal" - agitation    Outpatient Meds: Current Outpatient Medications  Medication Sig Dispense Refill   atenolol (TENORMIN) 25 MG tablet TAKE 1 TABLET BY MOUTH EVERY DAY 90 tablet 3   pantoprazole (PROTONIX) 40 MG tablet Take 1 tablet (40 mg total) by mouth 2 (two) times daily. 180 tablet 1   phenylephrine-shark liver oil-mineral oil-petrolatum (PREPARATION H) 0.25-3-14-71.9 % rectal ointment Place 1 Application rectally 2 (two) times daily as needed for hemorrhoids. 30 g 0   pravastatin (PRAVACHOL) 40 MG tablet Take  1 tablet (40 mg total) by mouth daily. 90 tablet 3   senna-docusate (SENOKOT-S) 8.6-50 MG tablet Take 1-2 tablets by mouth at bedtime. 60 tablet 2   triamterene-hydrochlorothiazide (DYAZIDE) 37.5-25 MG capsule Take 1 each (1 capsule total) by mouth daily. 90 capsule 3   No current facility-administered medications for this visit.      ___________________________________________________________________ Objective   Exam:  BP 120/80   Pulse 84   Ht '5\' 6"'$  (1.676 m)   Wt 201 lb 12.8 oz (91.5 kg)   SpO2 97%   BMI 32.57 kg/m  Wt Readings from Last 3 Encounters:  01/30/23 201 lb 12.8 oz (91.5 kg)  01/09/23 206 lb 12.8 oz (93.8 kg)  12/31/22 201 lb 1.6 oz (91.2 kg)    General: well-appearing   Eyes: sclera anicteric, no redness ENT: oral mucosa moist without lesions, no cervical or supraclavicular lymphadenopathy. Dental repair. CV: RRR, no JVD, no peripheral edema Resp: clear to auscultation bilaterally, normal  RR and effort noted GI: soft, no tenderness. Distended. Obese. Reducible umbilical hernia. Active bowel sounds. No guarding or palpable organomegaly noted.  (Limited by body habitus) Skin; warm and dry, no rash or jaundice noted. Large, soft lipoma on left upper back, chronic. Neuro: awake, alert, interactive.  Follows commands well with visual cues.  No focal gross motor deficits. Rectal exam was deferred today. Labs:     Latest Ref Rng & Units 08/30/2022   11:20 AM 03/23/2022    1:04 AM 03/22/2022    1:16 AM  CBC  WBC 3.4 - 10.8 x10E3/uL 10.9  9.0  8.5   Hemoglobin 13.0 - 17.7 g/dL 11.7  9.0  9.0   Hematocrit 37.5 - 51.0 % 37.8  29.5  30.5   Platelets 150 - 450 x10E3/uL 275  231  240       Latest Ref Rng & Units 03/25/2022   11:12 AM 03/23/2022    1:04 AM 03/22/2022    1:16 AM  CMP  Glucose 70 - 99 mg/dL 108  106  109   BUN 8 - 27 mg/dL '12  6  7   '$ Creatinine 0.76 - 1.27 mg/dL 1.02  0.94  0.91   Sodium 134 - 144 mmol/L 140  140  139   Potassium 3.5 - 5.2 mmol/L  4.2  3.4  3.1   Chloride 96 - 106 mmol/L 103  110  107   CO2 20 - 29 mmol/L '20  25  26   '$ Calcium 8.6 - 10.2 mg/dL 9.4  8.6  8.2    Lab Results  Component Value Date   IRON 53 11/13/2022   TIBC 412 11/13/2022   FERRITIN 22 (L) 03/19/2022    Radiologic Studies:  CT ABDOMEN PELVIS W CONTRAST Addendum Date: 03/19/2022   ADDENDUM REPORT: 03/19/2022 03:06 ADDENDUM: These results were called by telephone at the time of interpretation on 03/19/2022 at 2:56 am to provider [facility] , who verbally acknowledged these results. Electronically Signed   By: Fidela Salisbury M.D.   On: 03/19/2022 03:06    Result Date: 03/19/2022 CLINICAL DATA:  Abdominal pain, acute, nonlocalized. Small-bowel obstruction. EXAM: CT ABDOMEN AND PELVIS WITH CONTRAST TECHNIQUE: Multidetector CT imaging of the abdomen and pelvis was performed using the standard protocol following bolus administration of intravenous contrast. RADIATION DOSE REDUCTION: This exam was performed according to the departmental dose-optimization program which includes automated exposure control, adjustment of the mA and/or kV according to patient size and/or use of iterative reconstruction technique. CONTRAST:  159m OMNIPAQUE IOHEXOL 300 MG/ML  SOLN COMPARISON:  None. FINDINGS: Lower chest: Mild bibasilar atelectasis. Cardiac size is within normal limits. Small fluid within the distal esophagus may relate to gastroesophageal reflux. Hepatobiliary: There is peripheral portal venous gas within the non dependent left hepatic lobe. No enhancing intrahepatic mass. No intra or extrahepatic biliary ductal dilation. Gallbladder unremarkable. Pancreas: Unremarkable Spleen: Unremarkable Adrenals/Urinary Tract: The adrenal glands are unremarkable. Multiple simple renal cortical and parapelvic cysts are seen bilaterally. No follow-up imaging is recommended for these lesions. No enhancing intra cortical lesion is identified. No hydronephrosis. No intrarenal or ureteral  calculi. The bladder is unremarkable. Stomach/Bowel: There is pneumatosis involving the gastric fundus with submucosal gas identified non dependently. There is gas noted within several mesenteric venous branches along the greater curvature of the stomach, axial image # 14/3 and 15/3. No free intraperitoneal gas. A a mid to distal small bowel obstruction is present with a single point of transition identified within the  right lower quadrant anteriorly at axial image # 67/3 and coronal image # 53/6. The proximal small bowel is dilated and fluid-filled. Distally, the small and large bowel are decompressed. The appendix is absent. No free intraperitoneal fluid. Vascular/Lymphatic: Aortic atherosclerosis. No enlarged abdominal or pelvic lymph nodes. Reproductive: Mild prostatic enlargement. Other: Small fat containing umbilical hernia. Musculoskeletal: No acute bone abnormality. No lytic or blastic bone lesion. IMPRESSION: Mid to distal small bowel obstruction. Pneumatosis of the gastric fundus. Mesenteric and portal venous gas. No free intraperitoneal gas. Fluid distension of the stomach and proximal small bowel. Fluid within the distal esophagus may relate to changes of gastroesophageal reflux. Aortic Atherosclerosis (ICD10-I70.0). Electronically Signed: By: Fidela Salisbury M.D. On: 03/19/2022 02:51   DG INTRO LONG GI TUBE Result Date: 03/19/2022 CLINICAL DATA:  Small-bowel obstruction, unsuccessful placement at bedside. EXAM: FL NG TUBE PLACEMENT CONTRAST:  None FLUOROSCOPY: FLUOROSCOPY Fluoroscopy time: 42 seconds Radiation Exposure Index (if provided by the fluoroscopic device): 1.74 mGy COMPARISON:  CT abdomen/pelvis 03/19/2022. FINDINGS: A 12 French nasogastric tube was placed with the assistance of a Glidewire through the left nare (after lidocaine prep) and passed to the level of the gastric body under fluoroscopic guidance. Refluxed gastric contents were observed within the tube. The procedure was performed  by Pasty Spillers, PA-C. IMPRESSION: Fluoroscopically-guided placement of a [place] Sump style nasogastric tube. At procedure termination, the tube terminates within the gastric body. Electronically Signed   By: Kellie Simmering D.O.   On: 03/19/2022 18:06   I have personally reviewed these images.  Assessment: Iron deficiency anemia, unspecified iron deficiency anemia type  Rectal bleeding  Chronic constipation  History of small bowel obstruction   Iron deficiency constipation or rectal bleeding that may date back as far as 2018 when he had endoscopic testing by Dr. Paulita Fujita (results of which we do not have).  Ongoing issues with all of this with recent treatment recommendations by primary care.  Suspicion of hemorrhoidal bleeding, but also iron deficiency anemia that has hopefully improved after 2 recent IV iron treatments.  Curious that he had a high-grade small bowel obstruction in April 2023 without having had prior abdominal surgery or large bowel obstruction at the time.  It is also very unusual that he had gastric pneumatosis and portal venous air.  He was seen in surgical consultation and treated with conservative management of NG tube drainage and bowel rest. He currently does not seem to be describing abdominal pain, but his family feels like he indicates he could have some discomfort at times.  I am unable to characterize it today due to communication challenges.  He has no physical exam signs of bowel obstruction at this time and no tenderness. Plan: CMP, CBC, iron studies  EGD and colonoscopy.  The benefits and risks of the planned procedure were described in detail with the patient or (when appropriate) their health care proxy.  Risks were outlined as including, but not limited to, bleeding, infection, perforation, adverse medication reaction leading to cardiac or pulmonary decompensation, pancreatitis (if ERCP).  The limitation of incomplete mucosal visualization was also discussed.  No  guarantees or warranties were given.   After those findings, we can then determine whether or not he needs repeat CT abdomen imaging.  His mother feels that he is moving his bowels fairly well on the recently prescribed senna/Dulcolax tablets every evening.  I recommended they reinstitute the MiraLAX as needed if he does not have a bowel movement daily.  Thank you for the  courtesy of this consult.  Please call me with any questions or concerns.   Wilfrid Lund, MD    Velora Heckler Lanette Hampshire, [place], MD, have reviewed all documentation for this visit. The documentation on 01/30/23 for the exam, diagnosis, procedures, and orders are all accurate and complete.   CC: Referring provider noted above   I,Safa M Kadhim,acting as a scribe for Nelida Meuse III, MD.,have documented all relevant documentation on the behalf of Doran Stabler, MD,as directed by  Doran Stabler, MD while in the presence of Nelida Meuse III, MD.  Extensive chart review and extra time for communication required for this encounter.

## 2023-02-04 ENCOUNTER — Telehealth: Payer: Self-pay | Admitting: Gastroenterology

## 2023-02-04 DIAGNOSIS — H40033 Anatomical narrow angle, bilateral: Secondary | ICD-10-CM | POA: Diagnosis not present

## 2023-02-04 DIAGNOSIS — H2589 Other age-related cataract: Secondary | ICD-10-CM | POA: Diagnosis not present

## 2023-02-04 DIAGNOSIS — H25811 Combined forms of age-related cataract, right eye: Secondary | ICD-10-CM | POA: Diagnosis not present

## 2023-02-04 NOTE — Telephone Encounter (Signed)
Returned call to [name], see 01/30/23 result note. Thanks

## 2023-02-04 NOTE — Telephone Encounter (Signed)
Patient sister is returning call to discuss lab results. Please advise.

## 2023-02-21 ENCOUNTER — Encounter: Payer: Self-pay | Admitting: Student

## 2023-02-21 ENCOUNTER — Ambulatory Visit (INDEPENDENT_AMBULATORY_CARE_PROVIDER_SITE_OTHER): Payer: 59

## 2023-02-21 ENCOUNTER — Ambulatory Visit (INDEPENDENT_AMBULATORY_CARE_PROVIDER_SITE_OTHER): Payer: 59 | Admitting: Student

## 2023-02-21 VITALS — BP 109/66 | HR 68 | Ht 66.0 in | Wt 206.0 lb

## 2023-02-21 DIAGNOSIS — D500 Iron deficiency anemia secondary to blood loss (chronic): Secondary | ICD-10-CM

## 2023-02-21 DIAGNOSIS — Z0000 Encounter for general adult medical examination without abnormal findings: Secondary | ICD-10-CM

## 2023-02-21 DIAGNOSIS — I10 Essential (primary) hypertension: Secondary | ICD-10-CM | POA: Diagnosis not present

## 2023-02-21 DIAGNOSIS — J302 Other seasonal allergic rhinitis: Secondary | ICD-10-CM | POA: Diagnosis not present

## 2023-02-21 MED ORDER — FLUTICASONE PROPIONATE 50 MCG/ACT NA SUSP: 1.0000 | Freq: Every day | NASAL | 2 refills | Status: DC

## 2023-02-21 MED ORDER — LORATADINE 10 MG PO TABS: 10.0000 mg | ORAL_TABLET | Freq: Every day | ORAL | 2 refills | Status: DC

## 2023-02-21 NOTE — Patient Instructions (Addendum)
Thank you, [name] for allowing [place] to provide your care today. Today we discussed .  Seasonal allergies/nighttime cough I believe your cough is from your seasonal allergies. I would like to see if this gets better after you start using flonase and taking claritin. If not, I want you to come back and see [place] sooner.   Blood pressure Your blood pressure was low today, our goal for you would be under 130/90 but above 100/70. If you feel lightheaded and dizzy please call us  You are having two procedures next month, cataract surgery and a colonoscopy. Please let us know if you need anything from [place] prior to these.     I have ordered the following labs for you:  Lab Orders  No laboratory test(s) ordered today      Referrals ordered today:   Referral Orders  No referral(s) requested today     I have ordered the following medication/changed the following medications:   Stop the following medications: There are no discontinued medications.   Start the following medications: Meds ordered this encounter  Medications   fluticasone (FLONASE) 50 MCG/ACT nasal spray    Sig: Place 1 spray into both nostrils daily.    Dispense:  9.9 mL    Refill:  2   loratadine (CLARITIN) 10 MG tablet    Sig: Take 1 tablet (10 mg total) by mouth daily.    Dispense:  30 tablet    Refill:  2     Follow up: 4-6 months or sooner if need be   Should you have any questions or concerns please call the internal medicine clinic at [phone].    Sanjuana Letters, D.O. [place]

## 2023-02-22 NOTE — Assessment & Plan Note (Signed)
Assessment: BP of triamterene-HCTZ 37.5-25 mg daily and atenolol 25 mg daily. BP today of 109/66. With his history of deaf mutism and intellectual disability difficult to determine if he is having symptomatic hypotension episodes. Discussed with his mother and sister if patient is acting as though he does not feel well for them to check his blood pressure.   Plan: - continue triamterene-HCTZ 37.5-25 mg and atenolol 25 mg daily

## 2023-02-22 NOTE — Progress Notes (Signed)
   CC: follow up seasonal allergies, chronic anemia  HPI:  [name] is a 67 y.o. male living with a history stated below and presents today for follow up of his chronic medical conditions. Please see problem based assessment and plan for additional details.  Past Medical History:  Diagnosis Date   Abscess of buttock, right 08/26/2022   Chronic constipation    Deaf-mutism    Dental caries    History of iron deficiency anemia    Hypertriglyceridemia    Intellectual disability    Intertrigo    Schizophrenia, paranoid, chronic ([facility])    Umbilical hernia     Current Outpatient Medications on File Prior to Visit  Medication Sig Dispense Refill   atenolol (TENORMIN) 25 MG tablet TAKE 1 TABLET BY MOUTH EVERY DAY 90 tablet 3   pantoprazole (PROTONIX) 40 MG tablet Take 1 tablet (40 mg total) by mouth 2 (two) times daily. 180 tablet 1   phenylephrine-shark liver oil-mineral oil-petrolatum (PREPARATION H) 0.25-3-14-71.9 % rectal ointment Place 1 Application rectally 2 (two) times daily as needed for hemorrhoids. 30 g 0   pravastatin (PRAVACHOL) 40 MG tablet Take 1 tablet (40 mg total) by mouth daily. 90 tablet 3   senna-docusate (SENOKOT-S) 8.6-50 MG tablet Take 1-2 tablets by mouth at bedtime. 60 tablet 2   triamterene-hydrochlorothiazide (DYAZIDE) 37.5-25 MG capsule Take 1 each (1 capsule total) by mouth daily. 90 capsule 3   No current facility-administered medications on file prior to visit.    Review of Systems: ROS negative except for what is noted on the assessment and plan.  Vitals:   02/21/23 1044  BP: 109/66  Pulse: 68  SpO2: 100%  Weight: 206 lb (93.4 kg)  Height: 5\' 6"  (1.676 m)    Physical Exam: Constitutional: in no acute distress, hyperventilating when stethoscope applied to back. HENT: normocephalic atraumatic Neck: supple Cardiovascular: Tachycardic, no murmurs, gallops, rubs Pulmonary/Chest: normal work of breathing on room air, lungs clear to  auscultation bilaterally.  MSK: normal bulk and tone Neurological: alert Skin: warm and dry Psych: pleasant  Assessment & Plan:   Essential hypertension Assessment: BP of triamterene-HCTZ 37.5-25 mg daily and atenolol 25 mg daily. BP today of 109/66. With his history of deaf mutism and intellectual disability difficult to determine if he is having symptomatic hypotension episodes. Discussed with his mother and sister if patient is acting as though he does not feel well for them to check his blood pressure.   Plan: - continue triamterene-HCTZ 37.5-25 mg and atenolol 25 mg daily  Iron deficiency anemia due to chronic blood loss Assessment: Prior this was thought to be due to hemorrhoids, he has bene evaluated by GI Dr. Loletha Carrow and planned for colonoscopy next month. Last hemoglobin last month of 11  Plan: - follow up colonoscopy results. - check hgb at follow up visit  Seasonal allergies Assessment: His mother endorses he has a frequent nightly cough with nasal congestion. Will refill flonase and claritin 10 mg daily. Monitor for symptomatic improvement  Plan: - flonase daily and claritin 10 mg daily  Patient discussed with Dr. Newell Coral, D.O. [facility], PGY-3 Phone: [phone] Date 02/22/2023 Time 11:02 AM

## 2023-02-22 NOTE — Assessment & Plan Note (Addendum)
Assessment: Prior this was thought to be due to hemorrhoids, he has bene evaluated by GI Dr. Loletha Carrow and planned for colonoscopy next month. Last hemoglobin last month of 11  Plan: - follow up colonoscopy results. - check hgb at follow up visit

## 2023-02-22 NOTE — Assessment & Plan Note (Addendum)
Assessment: His mother endorses he has a frequent nightly cough with nasal congestion for the past month. Similar symptoms have occurred in the past with weather changes. Will refill flonase and claritin 10 mg daily. Monitor for symptomatic improvement. Do not suspect infectious etiology.   Plan: - flonase daily and claritin 10 mg daily

## 2023-02-24 NOTE — Progress Notes (Signed)
Subjective:   Leonard Tucker is a 67 y.o. male who presents for an Initial Medicare Annual Wellness Visit. I connected with  Philis Pique on 02/24/23 by a  Face-To-Face encounter  nand verified that I am speaking with the correct person using two identifiers.  Patient Location: Other:  Office/Clinic  Provider Location: Office/Clinic  I discussed the limitations of evaluation and management by telemedicine. The patient expressed understanding and agreed to proceed.  Review of Systems    Defer to PCP       Objective:    Today's Vitals   02/24/23 0823  BP: 109/66  Pulse: 68  SpO2: 100%  Weight: 206 lb (93.4 kg)  Height: 5\' 6"  (1.676 m)   Body mass index is 33.25 kg/m.     02/24/2023    8:26 AM 02/24/2023    8:24 AM 02/21/2023   10:42 AM 12/31/2022    2:21 PM 11/13/2022    2:22 PM 08/30/2022    9:46 AM 08/26/2022    9:53 AM  Advanced Directives  Does Patient Have a Medical Advance Directive? No No No No No No No  Would patient like information on creating a medical advance directive? No - Patient declined No - Patient declined No - Patient declined No - Patient declined No - Patient declined No - Patient declined No - Patient declined    Current Medications (verified) Outpatient Encounter Medications as of 02/21/2023  Medication Sig   atenolol (TENORMIN) 25 MG tablet TAKE 1 TABLET BY MOUTH EVERY DAY   fluticasone (FLONASE) 50 MCG/ACT nasal spray Place 1 spray into both nostrils daily.   loratadine (CLARITIN) 10 MG tablet Take 1 tablet (10 mg total) by mouth daily.   pantoprazole (PROTONIX) 40 MG tablet Take 1 tablet (40 mg total) by mouth 2 (two) times daily.   phenylephrine-shark liver oil-mineral oil-petrolatum (PREPARATION H) 0.25-3-14-71.9 % rectal ointment Place 1 Application rectally 2 (two) times daily as needed for hemorrhoids.   pravastatin (PRAVACHOL) 40 MG tablet Take 1 tablet (40 mg total) by mouth daily.   senna-docusate (SENOKOT-S) 8.6-50 MG tablet Take 1-2  tablets by mouth at bedtime.   triamterene-hydrochlorothiazide (DYAZIDE) 37.5-25 MG capsule Take 1 each (1 capsule total) by mouth daily.   No facility-administered encounter medications on file as of 02/21/2023.    Allergies (verified) Chlorpheniramine-dm and Robitussin dm [dextromethorphan-guaifenesin]   History: Past Medical History:  Diagnosis Date   Abscess of buttock, right 08/26/2022   Chronic constipation    Deaf-mutism    Dental caries    History of iron deficiency anemia    Hypertriglyceridemia    Intellectual disability    Intertrigo    Schizophrenia, paranoid, chronic ([place])    Umbilical hernia    History reviewed. No pertinent surgical history. Family History  Problem Relation Age of Onset   Hypertension Mother    Diabetes Maternal Aunt    Social History   Socioeconomic History   Marital status: Single    Spouse name: Not on file   Number of children: Not on file   Years of education: Not on file   Highest education level: Not on file  Occupational History   Not on file  Tobacco Use   Smoking status: Never   Smokeless tobacco: Never  Substance and Sexual Activity   Alcohol use: No    Alcohol/week: 0.0 standard drinks of alcohol   Drug use: No   Sexual activity: Not on file  Other Topics Concern   Not  on file  Social History Narrative   His mother, Leonard Tucker is his primary caretaker and he lives with her.    Communicates via sign language          Social Determinants of Health   Financial Resource Strain: Low Risk  (02/24/2023)   Overall Financial Resource Strain (CARDIA)    Difficulty of Paying Living Expenses: Not hard at all  Food Insecurity: No Food Insecurity (02/24/2023)   Hunger Vital Sign    Worried About Running Out of Food in the Last Year: Never true    Ran Out of Food in the Last Year: Never true  Transportation Needs: No Transportation Needs (02/24/2023)   PRAPARE - Hydrologist (Medical): No    Lack  of Transportation (Non-Medical): No  Physical Activity: Inactive (02/24/2023)   Exercise Vital Sign    Days of Exercise per Week: 0 days    Minutes of Exercise per Session: 0 min  Stress: No Stress Concern Present (02/24/2023)   [place]    Feeling of Stress : Not at all  Social Connections: Moderately Isolated (02/24/2023)   Social Connection and Isolation Panel [NHANES]    Frequency of Communication with Friends and Family: More than three times a week    Frequency of Social Gatherings with Friends and Family: More than three times a week    Attends Religious Services: 1 to 4 times per year    Active Member of [employer] or Organizations: No    Attends Music therapist: Never    Marital Status: Never married    Tobacco Counseling Counseling given: Not Answered   Clinical Intake:  Pre-visit preparation completed: Yes  Pain : No/denies pain Faces Pain Scale: No hurt  Faces Pain Scale: No hurt  Nutritional Risks: None Diabetes: No  How often do you need to have someone help you when you read instructions, pamphlets, or other written materials from your doctor or pharmacy?: 5 - Always  Diabetic?No  Interpreter Needed?: Yes  Information entered by :: Ruvi Fullenwider,cma   Activities of Daily Living    02/24/2023    8:25 AM 02/21/2023   10:42 AM  In your present state of health, do you have any difficulty performing the following activities:  Hearing? 1 1  Vision? 1 1  Difficulty concentrating or making decisions? 1 1  Walking or climbing stairs? 0 0  Dressing or bathing? 0 0  Doing errands, shopping? 1 1    Patient Care Team: Riesa Pope, MD as PCP - General  Indicate any recent Medical Services you may have received from other than [facility] providers in the past year (date may be approximate).     Assessment:   This is a routine wellness examination for Leonard Tucker.  Hearing/Vision  screen No results found.  Dietary issues and exercise activities discussed:     Goals Addressed   None   Depression Screen    02/24/2023    8:25 AM 02/21/2023   10:42 AM 01/09/2023    4:36 PM 12/31/2022    3:51 PM 11/13/2022    2:23 PM 08/26/2022    9:53 AM 04/15/2022    9:37 AM  PHQ 2/9 Scores  PHQ - 2 Score 0 0  0 0  0  PHQ- 9 Score 0 0  0 0  0  Exception Documentation   Medical reason   Other- indicate reason in comment box   Not  completed      pt is non verbal     Fall Risk    02/24/2023    8:26 AM 02/24/2023    8:24 AM 02/21/2023   10:41 AM 01/09/2023    4:36 PM 12/31/2022    3:50 PM  Fall Risk   Falls in the past year? 0 0 0 0 0  Number falls in past yr: 0 0 0 0 0  Injury with Fall? 0 0 0 0 0  Risk for fall due to : No Fall Risks No Fall Risks No Fall Risks No Fall Risks   Follow up Falls evaluation completed;Falls prevention discussed Falls evaluation completed;Falls prevention discussed Falls evaluation completed;Falls prevention discussed Falls evaluation completed Falls evaluation completed    FALL RISK PREVENTION PERTAINING TO THE HOME:  Any stairs in or around the home? No  If so, are there any without handrails? No  Home free of loose throw rugs in walkways, pet beds, electrical cords, etc? Yes  Adequate lighting in your home to reduce risk of falls? Yes   ASSISTIVE DEVICES UTILIZED TO PREVENT FALLS:  Life alert? Yes  Use of a cane, walker or w/c? No  Grab bars in the bathroom? No  Shower chair or bench in shower? No  Elevated toilet seat or a handicapped toilet? No   TIMED UP AND GO:  Was the test performed? No .  Length of time to ambulate 10 feet: 0 sec.   Gait slow and steady without use of assistive device  Cognitive Function:        Immunizations Immunization History  Administered Date(s) Administered   Influenza Split 08/21/2011, 08/14/2012   Influenza Whole 08/28/2006, 09/22/2007, 08/29/2008, 09/21/2009, 09/04/2010   Influenza,inj,Quad  PF,6+ Mos 08/11/2013, 10/19/2014, 09/13/2015, 09/12/2016, 09/24/2017, 08/19/2018, 09/23/2019, 09/27/2020, 08/29/2021, 08/26/2022   Td 10/18/2009, 08/29/2021    TDAP status: Up to date  Flu Vaccine status: Up to date  Pneumococcal vaccine status: Due, Education has been provided regarding the importance of this vaccine. Advised may receive this vaccine at local pharmacy or Health Dept. Aware to provide a copy of the vaccination record if obtained from local pharmacy or Health Dept. Verbalized acceptance and understanding.  Covid-19 vaccine status: Information provided on how to obtain vaccines.   Qualifies for Shingles Vaccine? No   Zostavax completed No   Shingrix Completed?: No.    Education has been provided regarding the importance of this vaccine. Patient has been advised to call insurance company to determine out of pocket expense if they have not yet received this vaccine. Advised may also receive vaccine at local pharmacy or Health Dept. Verbalized acceptance and understanding.  Screening Tests Health Maintenance  Topic Date Due   COVID-19 Vaccine (1) Never done   Zoster Vaccines- Shingrix (1 of 2) Never done   Pneumonia Vaccine 52+ Years old (1 of 1 - PCV) 02/19/2023   Medicare Annual Wellness (AWV)  02/21/2024   COLONOSCOPY (Pts 45-20yrs Insurance coverage will need to be confirmed)  02/23/2024   DTaP/Tdap/Td (3 - Tdap) 08/30/2031   INFLUENZA VACCINE  Completed   Hepatitis C Screening  Completed   HIV Screening  Completed   HPV VACCINES  Aged Out    Health Maintenance  Health Maintenance Due  Topic Date Due   COVID-19 Vaccine (1) Never done   Zoster Vaccines- Shingrix (1 of 2) Never done   Pneumonia Vaccine 34+ Years old (1 of 1 - PCV) 02/19/2023    Colorectal cancer screening: Type of screening:  Colonoscopy. Completed 02/22/2014. Repeat every 10 years  Lung Cancer Screening: (Low Dose CT Chest recommended if Age 76-80 years, 30 pack-year currently smoking OR have  quit w/in 15years.) does not qualify.   Lung Cancer Screening Referral: N/A  Additional Screening:  Hepatitis C Screening: does not qualify; Completed 10/04/2020  Vision Screening: Recommended annual ophthalmology exams for early detection of glaucoma and other disorders of the eye. Is the patient up to date with their annual eye exam?  Yes  Who is the provider or what is the name of the office in which the patient attends annual eye exams? Dr.Groat If pt is not established with a provider, would they like to be referred to a provider to establish care? No .   Dental Screening: Recommended annual dental exams for proper oral hygiene  Community Resource Referral / Chronic Care Management: CRR required this visit?  No   CCM required this visit?  No      Plan:     I have personally reviewed and noted the following in the patient's chart:   Medical and social history Use of alcohol, tobacco or illicit drugs  Current medications and supplements including opioid prescriptions. Patient is not currently taking opioid prescriptions. Functional ability and status Nutritional status Physical activity Advanced directives List of other physicians Hospitalizations, surgeries, and ER visits in previous 12 months Vitals Screenings to include cognitive, depression, and falls Referrals and appointments  In addition, I have reviewed and discussed with patient certain preventive protocols, quality metrics, and best practice recommendations. A written personalized care plan for preventive services as well as general preventive health recommendations were provided to patient.     Kerin Perna, [facility]   02/24/2023   Nurse Notes: Face-To-Face Visit, relative present to assist with questions.  Mr. Glunz , Thank you for taking time to come for your Medicare Wellness Visit. I appreciate your ongoing commitment to your health goals. Please review the following plan we discussed and let me know if I  can assist you in the future.   These are the goals we discussed:  Goals   None     This is a list of the screening recommended for you and due dates:  Health Maintenance  Topic Date Due   COVID-19 Vaccine (1) Never done   Zoster (Shingles) Vaccine (1 of 2) Never done   Pneumonia Vaccine (1 of 1 - PCV) 02/19/2023   Medicare Annual Wellness Visit  02/21/2024   Colon Cancer Screening  02/23/2024   DTaP/Tdap/Td vaccine (3 - Tdap) 08/30/2031   Flu Shot  Completed   Hepatitis C Screening: USPSTF Recommendation to screen - Ages 18-79 yo.  Completed   HIV Screening  Completed   HPV Vaccine  Aged Out

## 2023-02-24 NOTE — Progress Notes (Signed)
I agree with CMA ellison's note and AWV

## 2023-02-24 NOTE — Addendum Note (Signed)
Addended by: Riesa Pope on: 02/24/2023 08:59 AM   Modules accepted: Level of Service

## 2023-02-25 ENCOUNTER — Encounter: Payer: Self-pay | Admitting: Gastroenterology

## 2023-03-04 ENCOUNTER — Telehealth: Payer: Self-pay | Admitting: Gastroenterology

## 2023-03-04 NOTE — Progress Notes (Signed)
Internal Medicine Clinic Attending  Case discussed with Dr. Johnney Ou  At the time of the visit.  We reviewed the resident's history and exam and pertinent patient test results.  I agree with the assessment, diagnosis, and plan of care documented in the resident's note. HTN has been tightly controlled for a year and a reduction in medication could be entertained at next visit.  He may be prone to dehydration with thiazide.

## 2023-03-04 NOTE — Telephone Encounter (Signed)
The pt sister wanted to review the medications that the pt should avoid prior to the procedure.  We discussed blood thinners,and diabetes medications.  The pt is not on either so no meds to avoid.  The pt has been advised of the information and verbalized understanding.

## 2023-03-04 NOTE — Telephone Encounter (Signed)
Inbound call from patient sister , she is reading instructions for up coming procedure and have questing regarding a medication the patient is taking .Please advise

## 2023-03-06 ENCOUNTER — Ambulatory Visit (AMBULATORY_SURGERY_CENTER): Payer: 59 | Admitting: Gastroenterology

## 2023-03-06 ENCOUNTER — Encounter: Payer: Self-pay | Admitting: Gastroenterology

## 2023-03-06 ENCOUNTER — Other Ambulatory Visit: Payer: Self-pay | Admitting: Gastroenterology

## 2023-03-06 VITALS — BP 105/59 | HR 82 | Temp 98.0°F | Resp 11 | Ht 66.0 in | Wt 201.0 lb

## 2023-03-06 DIAGNOSIS — K2980 Duodenitis without bleeding: Secondary | ICD-10-CM | POA: Diagnosis not present

## 2023-03-06 DIAGNOSIS — K625 Hemorrhage of anus and rectum: Secondary | ICD-10-CM

## 2023-03-06 DIAGNOSIS — D509 Iron deficiency anemia, unspecified: Secondary | ICD-10-CM

## 2023-03-06 MED ORDER — SODIUM CHLORIDE 0.9 % IV SOLN: 500.0000 mL | INTRAVENOUS | Status: DC

## 2023-03-06 NOTE — Progress Notes (Signed)
History and Physical:  This patient presents for endoscopic testing for: Encounter Diagnoses  Name Primary?   Iron deficiency anemia, unspecified iron deficiency anemia type Yes   Rectal bleeding     Clinical details in 01/30/23 office consult note.  IDA, rectal bleeding, chronic constipation.  Patient is otherwise without complaints or active issues today.   Past Medical History: Past Medical History:  Diagnosis Date   Abscess of buttock, right 08/26/2022   Chronic constipation    Deaf-mutism    Dental caries    History of iron deficiency anemia    Hypertriglyceridemia    Intellectual disability    Intertrigo    Schizophrenia, paranoid, chronic    Umbilical hernia      Past Surgical History: History reviewed. No pertinent surgical history.  Allergies: Allergies  Allergen Reactions   Chlorpheniramine-Dm Nausea And Vomiting   Robitussin Dm [Dextromethorphan-Guaifenesin] Other (See Comments)    "Acted out more than normal" - agitation    Outpatient Meds: Current Outpatient Medications  Medication Sig Dispense Refill   atenolol (TENORMIN) 25 MG tablet TAKE 1 TABLET BY MOUTH EVERY DAY 90 tablet 3   loratadine (CLARITIN) 10 MG tablet Take 1 tablet (10 mg total) by mouth daily. 30 tablet 2   pantoprazole (PROTONIX) 40 MG tablet Take 1 tablet (40 mg total) by mouth 2 (two) times daily. 180 tablet 1   pravastatin (PRAVACHOL) 40 MG tablet Take 1 tablet (40 mg total) by mouth daily. 90 tablet 3   triamterene-hydrochlorothiazide (DYAZIDE) 37.5-25 MG capsule Take 1 each (1 capsule total) by mouth daily. 90 capsule 3   fluticasone (FLONASE) 50 MCG/ACT nasal spray Place 1 spray into both nostrils daily. 9.9 mL 2   phenylephrine-shark liver oil-mineral oil-petrolatum (PREPARATION H) 0.25-3-14-71.9 % rectal ointment Place 1 Application rectally 2 (two) times daily as needed for hemorrhoids. 30 g 0   senna-docusate (SENOKOT-S) 8.6-50 MG tablet Take 1-2 tablets by mouth at bedtime. 60  tablet 2   Current Facility-Administered Medications  Medication Dose Route Frequency Provider Last Rate Last Admin   0.9 %  sodium chloride infusion  500 mL Intravenous Continuous Doran Stabler, MD            Latest Ref Rng & Units 01/30/2023    9:57 AM 08/30/2022   11:20 AM 03/23/2022    1:04 AM  CBC  WBC 4.0 - 10.5 K/uL 8.8  10.9  9.0   Hemoglobin 13.0 - 17.0 g/dL 11.7  11.7  9.0   Hematocrit 39.0 - 52.0 % 37.5  37.8  29.5   Platelets 150.0 - 400.0 K/uL 220.0  275  231     ___________________________________________________________________ Objective   Exam:  BP 128/76   Pulse 91   Temp 98 F (36.7 C)   Ht 5\' 6"  (1.676 m)   Wt 201 lb (91.2 kg)   SpO2 95%   BMI 32.44 kg/m   CV: regular , S1/S2 Resp: clear to auscultation bilaterally, normal RR and effort noted GI: soft, no tenderness, with active bowel sounds.   Assessment: Encounter Diagnoses  Name Primary?   Iron deficiency anemia, unspecified iron deficiency anemia type Yes   Rectal bleeding      Plan: Colonoscopy EGD    The patient is appropriate for an endoscopic procedure in the ambulatory setting.   - Wilfrid Lund, MD

## 2023-03-06 NOTE — Patient Instructions (Signed)
Please read handouts provided. Resume previous diet. Continue present medications. Await pathology results. Return to primary care physician at appointment to be scheduled for close monitoring of CBC and iron studies.   YOU HAD AN ENDOSCOPIC PROCEDURE TODAY AT [facility]:   Refer to the procedure report that was given to you for any specific questions about what was found during the examination.  If the procedure report does not answer your questions, please call your gastroenterologist to clarify.  If you requested that your care partner not be given the details of your procedure findings, then the procedure report has been included in a sealed envelope for you to review at your convenience later.  YOU SHOULD EXPECT: Some feelings of bloating in the abdomen. Passage of more gas than usual.  Walking can help get rid of the air that was put into your GI tract during the procedure and reduce the bloating. If you had a lower endoscopy (such as a colonoscopy or flexible sigmoidoscopy) you may notice spotting of blood in your stool or on the toilet paper. If you underwent a bowel prep for your procedure, you may not have a normal bowel movement for a few days.  Please Note:  You might notice some irritation and congestion in your nose or some drainage.  This is from the oxygen used during your procedure.  There is no need for concern and it should clear up in a day or so.  SYMPTOMS TO REPORT IMMEDIATELY:  Following lower endoscopy (colonoscopy or flexible sigmoidoscopy):  Excessive amounts of blood in the stool  Significant tenderness or worsening of abdominal pains  Swelling of the abdomen that is new, acute  Fever of 100F or higher  Following upper endoscopy (EGD)  Vomiting of blood or coffee ground material  New chest pain or pain under the shoulder blades  Painful or persistently difficult swallowing  New shortness of breath  Fever of 100F or higher  Black, tarry-looking  stools  For urgent or emergent issues, a gastroenterologist can be reached at any hour by calling [phone]. Do not use MyChart messaging for urgent concerns.    DIET:  We do recommend a small meal at first, but then you may proceed to your regular diet.  Drink plenty of fluids but you should avoid alcoholic beverages for 24 hours.  ACTIVITY:  You should plan to take it easy for the rest of today and you should NOT DRIVE or use heavy machinery until tomorrow (because of the sedation medicines used during the test).    FOLLOW UP: Our staff will call the number listed on your records the next business day following your procedure.  We will call around 7:15- 8:00 am to check on you and address any questions or concerns that you may have regarding the information given to you following your procedure. If we do not reach you, we will leave a message.     If any biopsies were taken you will be contacted by phone or by letter within the next 1-3 weeks.  Please call us at [phone] if you have not heard about the biopsies in 3 weeks.    SIGNATURES/CONFIDENTIALITY: You and/or your care partner have signed paperwork which will be entered into your electronic medical record.  These signatures attest to the fact that that the information above on your After Visit Summary has been reviewed and is understood.  Full responsibility of the confidentiality of this discharge information lies with you and/or  your care-partner. 

## 2023-03-06 NOTE — Progress Notes (Signed)
Pt's states no medical or surgical changes since previsit or office visit.  Per sister, [name]

## 2023-03-06 NOTE — Op Note (Signed)
[facility] Patient Name: Leonard Tucker Procedure Date: 03/06/2023 1:31 PM MRN: [id number] Endoscopist: Mallie Mussel L. Loletha Carrow , MD, [id number] Age: 67 Referring MD:  Date of Birth: 1956-04-05 Gender: Male Account #: [id number] Procedure:                Upper GI endoscopy Indications:              Unexplained iron deficiency anemia Medicines:                Monitored Anesthesia Care Procedure:                Pre-Anesthesia Assessment:                           - Prior to the procedure, a History and Physical                            was performed, and patient medications and                            allergies were reviewed. The patient's tolerance of                            previous anesthesia was also reviewed. The risks                            and benefits of the procedure and the sedation                            options and risks were discussed with the patient.                            All questions were answered, and informed consent                            was obtained. Prior Anticoagulants: The patient has                            taken no anticoagulant or antiplatelet agents. ASA                            Grade Assessment: III - A patient with severe                            systemic disease. After reviewing the risks and                            benefits, the patient was deemed in satisfactory                            condition to undergo the procedure.                           After obtaining informed consent, the endoscope was  passed under direct vision. Throughout the                            procedure, the patient's blood pressure, pulse, and                            oxygen saturations were monitored continuously. The                            Olympus Scope [id number] was introduced through the                            mouth, and advanced to the third part of duodenum.                            The upper GI  endoscopy was accomplished without                            difficulty. The patient tolerated the procedure                            well. Scope In: Scope Out: Findings:                 The esophagus was normal. (Though EG junction                            somewhat patulous)                           The stomach was normal.                           The cardia and gastric fundus were normal on                            retroflexion.                           Normal mucosa was found in the entire duodenum.                            Biopsies for histology were taken with a cold                            forceps for evaluation of celiac disease. Complications:            No immediate complications. Estimated Blood Loss:     Estimated blood loss: none. Impression:               - Normal esophagus.                           - Normal stomach.                           - Normal mucosa was found in the entire examined  duodenum. Biopsied. Recommendation:           - Patient has a contact number available for                            emergencies. The signs and symptoms of potential                            delayed complications were discussed with the                            patient. Return to normal activities tomorrow.                            Written discharge instructions were provided to the                            patient.                           - Resume previous diet.                           - Continue present medications.                           - Await pathology results.                           - See the other procedure note for documentation of                            additional recommendations. Leonard Tucker L. Loletha Carrow, MD 03/06/2023 2:07:54 PM This report has been signed electronically.

## 2023-03-06 NOTE — Progress Notes (Signed)
After EGD, difficult to keep pt sedated and not obstructing.  Multiple times had to use jaw thrust to keep airwat open.  Sats dropped quickly with each apnea occurrence

## 2023-03-06 NOTE — Progress Notes (Signed)
Report to PACU, RN, vss, BBS= Clear.  

## 2023-03-06 NOTE — Op Note (Signed)
[place] Patient Name: Leonard Tucker Procedure Date: 03/06/2023 1:29 PM MRN: [id number] Endoscopist: Mallie Mussel L. Loletha Carrow , MD, [id number] Age: 67 Referring MD:  Date of Birth: 08-May-1956 Gender: Male Account #: [id number] Procedure:                Colonoscopy Indications:              Unexplained iron deficiency anemia Medicines:                Monitored Anesthesia Care Procedure:                Pre-Anesthesia Assessment:                           - Prior to the procedure, a History and Physical                            was performed, and patient medications and                            allergies were reviewed. The patient's tolerance of                            previous anesthesia was also reviewed. The risks                            and benefits of the procedure and the sedation                            options and risks were discussed with the patient.                            All questions were answered, and informed consent                            was obtained. Prior Anticoagulants: The patient has                            taken no anticoagulant or antiplatelet agents. ASA                            Grade Assessment: III - A patient with severe                            systemic disease. After reviewing the risks and                            benefits, the patient was deemed in satisfactory                            condition to undergo the procedure.                           After obtaining informed consent, the colonoscope  was passed under direct vision. Throughout the                            procedure, the patient's blood pressure, pulse, and                            oxygen saturations were monitored continuously. The                            Olympus CF-HQ190L ([id number]) Colonoscope was                            introduced through the anus and advanced to the the                            cecum, identified by  appendiceal orifice and                            ileocecal valve. The TI mucosa was briefly                            visualized, but the TI could not be deeply                            intubated due to redundant colon and scope looping.                            The colonoscopy was somewhat difficult due to a                            redundant colon and significant looping. Successful                            completion of the procedure was aided by using                            manual pressure and straightening and shortening                            the scope to obtain bowel loop reduction. The                            patient tolerated the procedure well. The quality                            of the bowel preparation was good. The ileocecal                            valve, appendiceal orifice, and rectum were                            photographed. Scope In: 1:45:53 PM Scope Out: 2:01:54 PM Scope Withdrawal Time: 0 hours 10 minutes 6 seconds  Total  Procedure Duration: 0 hours 16 minutes 1 second  Findings:                 The digital rectal exam findings include decreased                            sphincter tone and internal hemorrhoids that                            prolapse with straining, but require manual                            replacement into the anal canal (Grade III).                           Normal mucosa was found in the entire colon.                           Repeat examination of right colon under NBI                            performed.                           There is no endoscopic evidence of polyps in the                            entire colon.                           Internal hemorrhoids were found. (Without stigmata                            of bleeding)                           The exam was otherwise without abnormality on                            direct and retroflexion views. Complications:            No immediate  complications. Estimated Blood Loss:     Estimated blood loss: none. Impression:               - Decreased sphincter tone and internal hemorrhoids                            that prolapse with straining, but require manual                            replacement into the anal canal (Grade III) found                            on digital rectal exam.                           - Normal mucosa in the entire examined colon.                           -  Internal hemorrhoids.                           - The examination was otherwise normal on direct                            and retroflexion views.                           - No specimens collected.                           - The GI Genius (intelligent endoscopy module),                            computer-aided polyp detection system powered by AI                            was utilized to detect colorectal polyps through                            enhanced visualization during colonoscopy. Recommendation:           - Patient has a contact number available for                            emergencies. The signs and symptoms of potential                            delayed complications were discussed with the                            patient. Return to normal activities tomorrow.                            Written discharge instructions were provided to the                            patient.                           - Resume previous diet.                           - Continue present medications.                           - See the other procedure note for documentation of                            additional recommendations.                           - CT enterography and appointment to be scheduled.                           That is to evaluate for any areas  of small bowel                            findings that may preclude passage of a small bowel                            video capsule. It should be noted this patient was                             hospitalized for a small bowel obstruction in April                            2023 requiring NG tube drainage and responding to                            conservative management. No prior abdominal surgery.                           - Return to primary care physician at appointment                            to be scheduled. This is for close monitoring of                            CBC and iron studies with administration of IV iron                            as needed. Haidyn Kilburg L. Loletha Carrow, MD 03/06/2023 2:13:41 PM This report has been signed electronically.

## 2023-03-06 NOTE — Progress Notes (Signed)
Called to room to assist during endoscopic procedure.  Patient ID and intended procedure confirmed with present staff. Received instructions for my participation in the procedure from the performing physician.  

## 2023-03-06 NOTE — Progress Notes (Addendum)
AWV reviewed and approved as attested by Dr. Johnney Ou. Internal Medicine Clinic Attending Dorian Pod, MD

## 2023-03-07 ENCOUNTER — Telehealth: Payer: Self-pay | Admitting: *Deleted

## 2023-03-07 NOTE — Telephone Encounter (Signed)
  Follow up Call-     03/06/2023    1:02 PM  Call back number  Post procedure Call Back phone  # [phone] - Sister  Permission to leave phone message Yes     Patient questions:  Do you have a fever, pain , or abdominal swelling? No. Pain Score  0 *  Have you tolerated food without any problems? Yes.    Have you been able to return to your normal activities? Yes.    Do you have any questions about your discharge instructions: Diet   No. Medications  No. Follow up visit  No.  Do you have questions or concerns about your Care? No.  Actions: * If pain score is 4 or above: No action needed, pain <4.

## 2023-03-10 ENCOUNTER — Other Ambulatory Visit: Payer: Self-pay

## 2023-03-10 DIAGNOSIS — D509 Iron deficiency anemia, unspecified: Secondary | ICD-10-CM

## 2023-03-10 DIAGNOSIS — Z8719 Personal history of other diseases of the digestive system: Secondary | ICD-10-CM

## 2023-03-11 ENCOUNTER — Encounter: Payer: Self-pay | Admitting: Gastroenterology

## 2023-03-27 ENCOUNTER — Ambulatory Visit (HOSPITAL_COMMUNITY): Payer: 59

## 2023-03-27 DIAGNOSIS — H2589 Other age-related cataract: Secondary | ICD-10-CM | POA: Diagnosis not present

## 2023-03-27 DIAGNOSIS — H268 Other specified cataract: Secondary | ICD-10-CM | POA: Diagnosis not present

## 2023-03-28 ENCOUNTER — Other Ambulatory Visit: Payer: Self-pay | Admitting: Student

## 2023-03-28 DIAGNOSIS — E785 Hyperlipidemia, unspecified: Secondary | ICD-10-CM

## 2023-04-21 ENCOUNTER — Other Ambulatory Visit: Payer: Self-pay | Admitting: Student

## 2023-04-21 DIAGNOSIS — I10 Essential (primary) hypertension: Secondary | ICD-10-CM

## 2023-04-28 DIAGNOSIS — H2511 Age-related nuclear cataract, right eye: Secondary | ICD-10-CM | POA: Diagnosis not present

## 2023-05-01 DIAGNOSIS — H268 Other specified cataract: Secondary | ICD-10-CM | POA: Diagnosis not present

## 2023-05-01 DIAGNOSIS — H25011 Cortical age-related cataract, right eye: Secondary | ICD-10-CM | POA: Diagnosis not present

## 2023-05-06 ENCOUNTER — Encounter: Payer: Self-pay | Admitting: Student

## 2023-05-14 ENCOUNTER — Encounter: Payer: Self-pay | Admitting: *Deleted

## 2023-05-14 NOTE — Progress Notes (Signed)
[facility] Quality Team Note  Name: ANTHON HARPOLE Date of Birth: 1956/06/21 MRN: [id number] Date: 05/14/2023  [facility] Quality Team has reviewed this patient's chart, please see recommendations below:  [facility] Quality Other; Pt has open gap for A1C.  Would need ordered and compliant before end of 2024 to close gap.

## 2023-05-18 ENCOUNTER — Other Ambulatory Visit: Payer: Self-pay | Admitting: Student

## 2023-06-03 ENCOUNTER — Other Ambulatory Visit: Payer: Self-pay | Admitting: Student

## 2023-06-18 ENCOUNTER — Encounter: Payer: 59 | Admitting: Internal Medicine

## 2023-07-01 ENCOUNTER — Ambulatory Visit: Payer: 59 | Admitting: Student

## 2023-07-01 ENCOUNTER — Encounter: Payer: Self-pay | Admitting: Student

## 2023-07-01 VITALS — BP 124/69 | HR 74 | Temp 98.1°F | Ht 66.0 in | Wt 212.9 lb

## 2023-07-01 DIAGNOSIS — K644 Residual hemorrhoidal skin tags: Secondary | ICD-10-CM

## 2023-07-01 DIAGNOSIS — R7303 Prediabetes: Secondary | ICD-10-CM | POA: Insufficient documentation

## 2023-07-01 DIAGNOSIS — K219 Gastro-esophageal reflux disease without esophagitis: Secondary | ICD-10-CM

## 2023-07-01 DIAGNOSIS — E785 Hyperlipidemia, unspecified: Secondary | ICD-10-CM | POA: Diagnosis not present

## 2023-07-01 DIAGNOSIS — E669 Obesity, unspecified: Secondary | ICD-10-CM

## 2023-07-01 DIAGNOSIS — I10 Essential (primary) hypertension: Secondary | ICD-10-CM

## 2023-07-01 DIAGNOSIS — Z13220 Encounter for screening for lipoid disorders: Secondary | ICD-10-CM | POA: Diagnosis not present

## 2023-07-01 DIAGNOSIS — Z131 Encounter for screening for diabetes mellitus: Secondary | ICD-10-CM | POA: Diagnosis not present

## 2023-07-01 DIAGNOSIS — D500 Iron deficiency anemia secondary to blood loss (chronic): Secondary | ICD-10-CM

## 2023-07-01 HISTORY — DX: Prediabetes: R73.03

## 2023-07-01 MED ORDER — PHENYLEPH-SHARK LIV OIL-MO-PET 0.25-3-14-71.9 % RE OINT
1.0000 | TOPICAL_OINTMENT | Freq: Two times a day (BID) | RECTAL | 0 refills | Status: DC | PRN
Start: 2023-07-01 — End: 2024-09-10

## 2023-07-01 NOTE — Patient Instructions (Addendum)
Thank you, Mr.Tyreik R Valera for allowing [place] to provide your care today. Today we discussed hypertension, weight management, prediabetes, and history of iron deficiency anemia.    I have ordered the following labs for you:  Lab Orders         CBC no Diff         Iron, TIBC and Ferritin Panel         BMP8+Anion Gap         Hemoglobin A1c         Lipid Profile       I will call if any are abnormal. All of your labs can be accessed through "My Chart".   My Chart Access: [email or web address]  Please follow-up in: 3 months for follow up of chronic medical conditions    We look forward to seeing you next time. Please call our clinic at [phone] if you have any questions or concerns. The best time to call is Monday-Friday from 9am-4pm, but there is someone available 24/7. If after hours or the weekend, call the main hospital number and ask for the Internal Medicine Resident On-Call. If you need medication refills, please notify your pharmacy one week in advance and they will send [place] a request.   Thank you for letting us take part in your care. Wishing you the best!  Morene Crocker, MD 07/01/2023, 2:50 PM [facility] Internal Medicine Resident, PGY2

## 2023-07-01 NOTE — Assessment & Plan Note (Addendum)
One time episode of this that self resolved in the past 2 months that he used preparation H at home. He has also kept up with bowel regimen that helps with regular, softer bowel movements.  No other concerns at this time. -Refilled preparation H

## 2023-07-01 NOTE — Assessment & Plan Note (Addendum)
Prior history of hemorrhoids and small bowel obstruction. S/p upper endoscopy and colonoscopy on 03/2023 with normal small intestine biopsies. Not able to explain IDA. Scheduled for CT enterography to further evaluate small bowel pathologies not easily visualized with small bowel capsule. Patient has required iron infusions in the past. -CBC today -Ferritin and iron panel today -Follow up celiac biopsy results

## 2023-07-01 NOTE — Progress Notes (Signed)
Subjective:  CC: Follow up  HPI:  [name] is a 67 y.o. male with a past medical history stated below and presents today for chronic condition follow up including IDA, prediabetes, dyslipidemia, and obesity. Please see problem based assessment and plan for additional details.  Past Medical History:  Diagnosis Date   Abscess of buttock, right 08/26/2022   Chronic constipation    Deaf-mutism    Dental caries    History of iron deficiency anemia    Hypertriglyceridemia    Intellectual disability    Intertrigo    Schizophrenia, paranoid, chronic (HCC)    Umbilical hernia     Current Outpatient Medications on File Prior to Visit  Medication Sig Dispense Refill   atenolol (TENORMIN) 25 MG tablet TAKE 1 TABLET BY MOUTH EVERY DAY 90 tablet 3   fluticasone (FLONASE) 50 MCG/ACT nasal spray Place 1 spray into both nostrils daily. 9.9 mL 2   loratadine (CLARITIN) 10 MG tablet TAKE 1 TABLET BY MOUTH EVERY DAY 90 tablet 1   pantoprazole (PROTONIX) 40 MG tablet TAKE 1 TABLET BY MOUTH TWICE A DAY 180 tablet 1   pravastatin (PRAVACHOL) 40 MG tablet TAKE 1 TABLET BY MOUTH EVERY DAY 90 tablet 1   triamterene-hydrochlorothiazide (DYAZIDE) 37.5-25 MG capsule TAKE 1 EACH (1 CAPSULE TOTAL) BY MOUTH DAILY. 90 capsule 3   No current facility-administered medications on file prior to visit.    Family History  Problem Relation Age of Onset   Hypertension Mother    Diabetes Maternal Aunt    Colon cancer Neg Hx    Rectal cancer Neg Hx    Stomach cancer Neg Hx    Esophageal cancer Neg Hx     Social History   Socioeconomic History   Marital status: Single    Spouse name: Not on file   Number of children: Not on file   Years of education: Not on file   Highest education level: Not on file  Occupational History   Not on file  Tobacco Use   Smoking status: Never   Smokeless tobacco: Never  Substance and Sexual Activity   Alcohol use: No    Alcohol/week: 0.0 standard drinks of  alcohol   Drug use: No   Sexual activity: Not on file  Other Topics Concern   Not on file  Social History Narrative   His mother, Leonard Tucker is his primary caretaker and he lives with her.    Communicates via sign language          Social Determinants of Health   Financial Resource Strain: Low Risk  (02/24/2023)   Overall Financial Resource Strain (CARDIA)    Difficulty of Paying Living Expenses: Not hard at all  Food Insecurity: No Food Insecurity (02/24/2023)   Hunger Vital Sign    Worried About Running Out of Food in the Last Year: Never true    Ran Out of Food in the Last Year: Never true  Transportation Needs: No Transportation Needs (02/24/2023)   PRAPARE - Administrator, Civil Service (Medical): No    Lack of Transportation (Non-Medical): No  Physical Activity: Inactive (02/24/2023)   Exercise Vital Sign    Days of Exercise per Week: 0 days    Minutes of Exercise per Session: 0 min  Stress: No Stress Concern Present (02/24/2023)   [employer] of Occupational Health - Occupational Stress Questionnaire    Feeling of Stress : Not at all  Social Connections: Moderately Isolated (02/24/2023)  Social Advertising account executive [NHANES]    Frequency of Communication with Friends and Family: More than three times a week    Frequency of Social Gatherings with Friends and Family: More than three times a week    Attends Religious Services: 1 to 4 times per year    Active Member of [employer] or Organizations: No    Attends Banker Meetings: Never    Marital Status: Never married  Intimate Partner Violence: Not At Risk (02/24/2023)   Humiliation, Afraid, Rape, and Kick questionnaire    Fear of Current or Ex-Partner: No    Emotionally Abused: No    Physically Abused: No    Sexually Abused: No    Review of Systems: ROS negative except for what is noted on the assessment and plan.  Objective:   Vitals:   07/01/23 1432  BP: 124/69  Pulse: 74   Temp: 98.1 F (36.7 C)  TempSrc: Oral  SpO2: 98%  Weight: 212 lb 14.4 oz (96.6 kg)  Height: 5\' 6"  (1.676 m)    Physical Exam: Constitutional: well-appearing elderly man in NAD HEENT: mucous membranes moist Eyes: conjunctiva non-erythematous Cardiovascular: regular rate and rhythm, no m/r/g Pulmonary/Chest: normal work of breathing on room air, lungs clear to auscultation bilaterally Abdominal: soft, non-tender, non-distended MSK: normal bulk and tone Neurological: alert & communicating through sign language with his family members Skin: warm and dry Psych: Pleasant mood and affect     Assessment & Plan:   Iron deficiency anemia due to chronic blood loss Prior history of hemorrhoids and small bowel obstruction. S/p upper endoscopy and colonoscopy on 03/2023 with normal small intestine biopsies. Not able to explain IDA. Scheduled for CT enterography to further evaluate small bowel pathologies not easily visualized with small bowel capsule. Patient has required iron infusions in the past. -CBC today -Ferritin and iron panel today -Follow up celiac biopsy results  GERD (gastroesophageal reflux disease) Normal duodenal and gastric mucosa on 03/2023 mucosa. Patient continues to experience relief from Protonix. Mother reports his reflux is worse with fried foods and laying down quickly after eating. - Continue protonix  Essential hypertension Blood pressure at goal today. -No changes to chronic, stable medication -BMP today  Dyslipidemia -lipid panel today -continue pravastatin unless significant increase in lipid panel today  Prediabetes -A1c today -Counseled on at least 150 min of moderate physical activity and foods higher in fiber, lean proteins, and vegetables as possible  External hemorrhoids One time episode of this that self resolved in the past 2 months that he used preparation H at home. He has also kept up with bowel regimen that helps with regular, softer bowel  movements.  No other concerns at this time. -Refilled preparation H  Obesity (BMI 30.0-34.9) Patient and family are making changes to decrease insulin resistance    Return in about 3 months (around 10/01/2023) for chronic medical condition follow up.  Patient discussed with Dr. Allyn Kenner, MD [facility] Internal Medicine Program - PGY-2 07/01/2023, 3:41 PM

## 2023-07-01 NOTE — Assessment & Plan Note (Addendum)
Normal duodenal and gastric mucosa on 03/2023 mucosa. Patient continues to experience relief from Protonix. Mother reports his reflux is worse with fried foods and laying down quickly after eating. - Continue protonix

## 2023-07-01 NOTE — Assessment & Plan Note (Signed)
Blood pressure at goal today. -No changes to chronic, stable medication -BMP today

## 2023-07-01 NOTE — Assessment & Plan Note (Addendum)
-  lipid panel today -continue pravastatin unless significant increase in lipid panel today

## 2023-07-01 NOTE — Assessment & Plan Note (Addendum)
-  A1c today -Counseled on at least 150 min of moderate physical activity and foods higher in fiber, lean proteins, and vegetables as possible

## 2023-07-01 NOTE — Assessment & Plan Note (Signed)
Patient and family are making changes to decrease insulin resistance

## 2023-07-04 NOTE — Progress Notes (Signed)
Internal Medicine Clinic Attending  Case discussed with the resident at the time of the visit.  We reviewed the resident's history and exam and pertinent patient test results.  I agree with the assessment, diagnosis, and plan of care documented in the resident's note.  

## 2023-07-17 NOTE — Progress Notes (Signed)
Reviewed with patien'ts sister. Improvement in hemoglobin. Discussed uptrending A1c, still in prediabetic range. Improving iron saturation. Ferritin now <100. Patient can continue taking PO iron

## 2023-07-30 ENCOUNTER — Ambulatory Visit: Payer: 59 | Admitting: *Deleted

## 2023-07-30 DIAGNOSIS — Z23 Encounter for immunization: Secondary | ICD-10-CM

## 2023-09-30 ENCOUNTER — Ambulatory Visit (INDEPENDENT_AMBULATORY_CARE_PROVIDER_SITE_OTHER): Payer: 59 | Admitting: Student

## 2023-09-30 ENCOUNTER — Encounter: Payer: Self-pay | Admitting: Student

## 2023-09-30 VITALS — BP 132/82 | HR 76 | Temp 98.0°F | Wt 203.7 lb

## 2023-09-30 DIAGNOSIS — D500 Iron deficiency anemia secondary to blood loss (chronic): Secondary | ICD-10-CM

## 2023-09-30 DIAGNOSIS — E139 Other specified diabetes mellitus without complications: Secondary | ICD-10-CM

## 2023-09-30 DIAGNOSIS — R7303 Prediabetes: Secondary | ICD-10-CM | POA: Diagnosis not present

## 2023-09-30 DIAGNOSIS — E119 Type 2 diabetes mellitus without complications: Secondary | ICD-10-CM | POA: Insufficient documentation

## 2023-09-30 DIAGNOSIS — K219 Gastro-esophageal reflux disease without esophagitis: Secondary | ICD-10-CM

## 2023-09-30 DIAGNOSIS — I10 Essential (primary) hypertension: Secondary | ICD-10-CM | POA: Diagnosis not present

## 2023-09-30 DIAGNOSIS — J302 Other seasonal allergic rhinitis: Secondary | ICD-10-CM

## 2023-09-30 DIAGNOSIS — W19XXXA Unspecified fall, initial encounter: Secondary | ICD-10-CM | POA: Diagnosis not present

## 2023-09-30 LAB — POCT GLYCOSYLATED HEMOGLOBIN (HGB A1C): Hemoglobin A1C: 6.3 % — AB (ref 4.0–5.6)

## 2023-09-30 LAB — GLUCOSE, CAPILLARY: Glucose-Capillary: 146 mg/dL — ABNORMAL HIGH (ref 70–99)

## 2023-09-30 MED ORDER — CETIRIZINE HCL 10 MG PO TABS: 10.0000 mg | ORAL_TABLET | Freq: Every day | ORAL | 2 refills | Status: DC

## 2023-09-30 NOTE — Assessment & Plan Note (Signed)
Blood pressure remained stable.  Today, it was 132/82.  He also received a BMP in July that was normal.  Denies any headache, nausea, vomiting, any other signs or symptoms. -Continue atenolol 25, triamterene-hydrochlorothiazide 37.5-25

## 2023-09-30 NOTE — Assessment & Plan Note (Signed)
Per the patient's mother, the patient was found on the ground.  The mother was unsure if the patient had fallen or he was mimicking his 65-year-old niece.  Per the patient's mother, the patient oftentimes mimics other people's behaviors.  Furthermore, is unclear if the patient was experiencing any knee pain.  His sister believes that the patient had fallen and his gait has become more unstable.  We discussed the possibility of physical therapy and with the patient would be amendable.  We will put in a referral for physical therapy to improve the patient's strength and balance.  His mother is also interested in possible day programs for the patient, so we put in a referral for social work to follow-up on these different options.  -Referral for physical therapy and social work

## 2023-09-30 NOTE — Assessment & Plan Note (Signed)
Patient has history of SBO and hemorrhoids.  Of note, he received an upper endoscopy and colonoscopy in April that was unremarkable.  He received his CBC back in July that demonstrated improvement of his hemoglobin at 12.8 and MCV of 75.  He also received iron studies at that time that were normal.  Per patient's mother and sister, patient has had trace bleeding but this has been stable with the past. - Follow-up CBC and iron panel

## 2023-09-30 NOTE — Patient Instructions (Addendum)
Thank you so much for coming to the clinic today!   Today, I have put in an order for social work and physical therapy. They should be calling you in the coming days.   For your allergies, I have also put in an order for a new medication called Zyrtec.   I have also put in an order for lab work to evaluate his anemia. I will follow-up when these results come back.   If you have any questions please feel free to the call the clinic at anytime at [phone]. It was a pleasure seeing you!  Best, Dr. Rayvon Char

## 2023-09-30 NOTE — Assessment & Plan Note (Signed)
Patient noted to have had watery eyes and increased productive cough at night.  He has a history of seasonal allergies.  Current signs and symptoms raise concern for postnasal drip.  Patient is currently on Flonase and Claritin. Patient does not prefer intranasal sprays so will hold off adding an intranasal antihistamine.  We will switch from Claritin to Zyrtec. - Begin Zyrtec - Continue Flonase

## 2023-09-30 NOTE — Progress Notes (Signed)
CC: Chronic disease follow-up  HPI:  Mr.Kashten R Kingcade is a 67 y.o. male living with a history stated below and presents today for chronic disease follow-up of his HTN, HLD, GERD, and DM. Please see problem based assessment and plan for additional details.  Past Medical History:  Diagnosis Date   Abscess of buttock, right 08/26/2022   Chronic constipation    Deaf-mutism    Dental caries    History of iron deficiency anemia    Hypertriglyceridemia    Intellectual disability    Intertrigo    Schizophrenia, paranoid, chronic (HCC)    Umbilical hernia     Current Outpatient Medications on File Prior to Visit  Medication Sig Dispense Refill   atenolol (TENORMIN) 25 MG tablet TAKE 1 TABLET BY MOUTH EVERY DAY 90 tablet 3   fluticasone (FLONASE) 50 MCG/ACT nasal spray Place 1 spray into both nostrils daily. 9.9 mL 2   pantoprazole (PROTONIX) 40 MG tablet TAKE 1 TABLET BY MOUTH TWICE A DAY 180 tablet 1   phenylephrine-shark liver oil-mineral oil-petrolatum (PREPARATION H) 0.25-3-14-71.9 % rectal ointment Place 1 Application rectally 2 (two) times daily as needed for hemorrhoids. 30 g 0   pravastatin (PRAVACHOL) 40 MG tablet TAKE 1 TABLET BY MOUTH EVERY DAY 90 tablet 1   triamterene-hydrochlorothiazide (DYAZIDE) 37.5-25 MG capsule TAKE 1 EACH (1 CAPSULE TOTAL) BY MOUTH DAILY. 90 capsule 3   No current facility-administered medications on file prior to visit.    Family History  Problem Relation Age of Onset   Hypertension Mother    Diabetes Maternal Aunt    Colon cancer Neg Hx    Rectal cancer Neg Hx    Stomach cancer Neg Hx    Esophageal cancer Neg Hx     Social History   Socioeconomic History   Marital status: Single    Spouse name: Not on file   Number of children: Not on file   Years of education: Not on file   Highest education level: Not on file  Occupational History   Not on file  Tobacco Use   Smoking status: Never   Smokeless tobacco: Never  Substance and  Sexual Activity   Alcohol use: No    Alcohol/week: 0.0 standard drinks of alcohol   Drug use: No   Sexual activity: Not on file  Other Topics Concern   Not on file  Social History Narrative   His mother, Hoa Fraim is his primary caretaker and he lives with her.    Communicates via sign language          Social Determinants of Health   Financial Resource Strain: Low Risk  (02/24/2023)   Overall Financial Resource Strain (CARDIA)    Difficulty of Paying Living Expenses: Not hard at all  Food Insecurity: No Food Insecurity (02/24/2023)   Hunger Vital Sign    Worried About Running Out of Food in the Last Year: Never true    Ran Out of Food in the Last Year: Never true  Transportation Needs: No Transportation Needs (02/24/2023)   PRAPARE - Administrator, Civil Service (Medical): No    Lack of Transportation (Non-Medical): No  Physical Activity: Inactive (02/24/2023)   Exercise Vital Sign    Days of Exercise per Week: 0 days    Minutes of Exercise per Session: 0 min  Stress: No Stress Concern Present (02/24/2023)   [employer] of Occupational Health - Occupational Stress Questionnaire    Feeling of Stress : Not at  all  Social Connections: Moderately Isolated (02/24/2023)   Social Connection and Isolation Panel [NHANES]    Frequency of Communication with Friends and Family: More than three times a week    Frequency of Social Gatherings with Friends and Family: More than three times a week    Attends Religious Services: 1 to 4 times per year    Active Member of [employer] or Organizations: No    Attends Banker Meetings: Never    Marital Status: Never married  Intimate Partner Violence: Not At Risk (02/24/2023)   Humiliation, Afraid, Rape, and Kick questionnaire    Fear of Current or Ex-Partner: No    Emotionally Abused: No    Physically Abused: No    Sexually Abused: No    Review of Systems: ROS negative except for what is noted on the assessment and  plan.  Vitals:   09/30/23 1028  BP: 132/82  Pulse: 76  Temp: 98 F (36.7 C)  TempSrc: Oral  SpO2: 99%  Weight: 203 lb 11.2 oz (92.4 kg)   Physical Exam Constitutional:      Appearance: Normal appearance.  HENT:     Head: Normocephalic and atraumatic.  Cardiovascular:     Rate and Rhythm: Normal rate and regular rhythm.     Pulses: Normal pulses.     Heart sounds: Normal heart sounds.  Pulmonary:     Effort: Pulmonary effort is normal.     Breath sounds: Normal breath sounds.  Abdominal:     General: Abdomen is flat. Bowel sounds are normal.     Palpations: Abdomen is soft.  Neurological:     General: No focal deficit present.     Mental Status: He is alert.     Gait: Gait abnormal.  Psychiatric:        Mood and Affect: Mood and affect normal.        Cognition and Memory: Cognition is impaired.     Comments: Patient noted to be deaf and mute and largely unable to participate in examination.     Assessment & Plan:   Iron deficiency anemia due to chronic blood loss Patient has history of SBO and hemorrhoids.  Of note, he received an upper endoscopy and colonoscopy in April that was unremarkable.  He received his CBC back in July that demonstrated improvement of his hemoglobin at 12.8 and MCV of 75.  He also received iron studies at that time that were normal.  Per patient's mother and sister, patient has had trace bleeding but this has been stable with the past. - Follow-up CBC and iron panel  GERD (gastroesophageal reflux disease) Patient continues to be on Protonix.  Per patient's mother, denies any signs or symptoms. - Continue Protonix  Essential hypertension Blood pressure remained stable.  Today, it was 132/82.  He also received a BMP in July that was normal.  Denies any headache, nausea, vomiting, any other signs or symptoms. -Continue atenolol 25, triamterene-hydrochlorothiazide 37.5-25  Diabetes (HCC) Patient's A1c in July was 6.8.  Today, improved to 6.3.   Continued to encourage healthy lifestyle so the diabetes can remain diet controlled. - Follow-up A1c in 3 months  Seasonal allergies Patient noted to have had watery eyes and increased productive cough at night.  He has a history of seasonal allergies.  Current signs and symptoms raise concern for postnasal drip.  Patient is currently on Flonase and Claritin. Patient does not prefer intranasal sprays so will hold off adding an intranasal antihistamine.  We  will switch from Claritin to Zyrtec. - Begin Zyrtec - Continue Flonase   Fall Per the patient's mother, the patient was found on the ground.  The mother was unsure if the patient had fallen or he was mimicking his 36-year-old niece.  Per the patient's mother, the patient oftentimes mimics other people's behaviors.  Furthermore, is unclear if the patient was experiencing any knee pain.  His sister believes that the patient had fallen and his gait has become more unstable.  We discussed the possibility of physical therapy and with the patient would be amendable.  We will put in a referral for physical therapy to improve the patient's strength and balance.  His mother is also interested in possible day programs for the patient, so we put in a referral for social work to follow-up on these different options.  -Referral for physical therapy and social work  Patient seen with Dr. Susa Raring, MD  [facility], PGY-1 Date 09/30/2023 Time 1:50 PM

## 2023-09-30 NOTE — Progress Notes (Signed)
Internal Medicine Clinic Attending  I was physically present during the key portions of the resident provided service and participated in the medical decision making of patient's management care. I reviewed pertinent patient test results.  The assessment, diagnosis, and plan were formulated together and I agree with the documentation in the resident's note.  Mercie Eon, MD    This is a very complex case of a patient who is deaf and mute, accompanied by his family today. Their main concern is getting him into a day program and getting more caregiver support - we have placed a referral to the value based care institute to help with additional resources and support today.

## 2023-09-30 NOTE — Assessment & Plan Note (Signed)
Patient continues to be on Protonix.  Per patient's mother, denies any signs or symptoms. - Continue Protonix

## 2023-09-30 NOTE — Assessment & Plan Note (Signed)
Patient's A1c in July was 6.8.  Today, improved to 6.3.  Continued to encourage healthy lifestyle so the diabetes can remain diet controlled. - Follow-up A1c in 3 months

## 2023-10-01 LAB — CBC
Hematocrit: 41.8 % (ref 37.5–51.0)
Hemoglobin: 12.5 g/dL — ABNORMAL LOW (ref 13.0–17.7)
MCH: 23 pg — ABNORMAL LOW (ref 26.6–33.0)
MCHC: 29.9 g/dL — ABNORMAL LOW (ref 31.5–35.7)
MCV: 77 fL — ABNORMAL LOW (ref 79–97)
Platelets: 293 10*3/uL (ref 150–450)
RBC: 5.43 x10E6/uL (ref 4.14–5.80)
RDW: 14.1 % (ref 11.6–15.4)
WBC: 7.3 10*3/uL (ref 3.4–10.8)

## 2023-10-01 LAB — IRON,TIBC AND FERRITIN PANEL
Ferritin: 61 ng/mL (ref 30–400)
Iron Saturation: 8 % — CL (ref 15–55)
Iron: 28 ug/dL — ABNORMAL LOW (ref 38–169)
Total Iron Binding Capacity: 343 ug/dL (ref 250–450)
UIBC: 315 ug/dL (ref 111–343)

## 2023-10-01 NOTE — Addendum Note (Signed)
Addended by: Morrie Sheldon on: 10/01/2023 09:49 AM   Modules accepted: Orders

## 2023-10-01 NOTE — Progress Notes (Signed)
Called patient to discuss lab results.  Spoke with the patient's sister.  Patient has a history of iron deficiency anemia requiring iron infusion.  Patient's current presentation with a microcytic anemia with low iron and iron saturation with normal ferritin likely suggest early stages of iron deficiency anemia.  Discussed oral iron supplementation versus IV iron supplementation, and preference was for IV iron supplementation to lessen pill burden for patient.  Scheduled for IV iron infusion for patient in outpatient setting.

## 2023-10-06 ENCOUNTER — Telehealth: Payer: Self-pay | Admitting: *Deleted

## 2023-10-06 ENCOUNTER — Encounter: Payer: Self-pay | Admitting: *Deleted

## 2023-10-06 NOTE — Progress Notes (Signed)
  Care Coordination  Outreach Note  10/06/2023 Name: Leonard Tucker MRN: [id number] DOB: 09-Feb-1956   Care Coordination Outreach Attempts: An unsuccessful telephone outreach was attempted today to offer the patient information about available care coordination services.  Follow Up Plan:  Additional outreach attempts will be made to offer the patient care coordination information and services.   Encounter Outcome:  No Answer  Gwenevere Ghazi  Care Coordination Care Guide  Direct Dial: [phone]

## 2023-10-06 NOTE — Progress Notes (Signed)
Error

## 2023-10-07 NOTE — Progress Notes (Signed)
  Care Coordination  Outreach Note  10/07/2023 Name: KAHLIL COWANS MRN: [id number] DOB: 12/21/1955   Care Coordination Outreach Attempts: A second unsuccessful outreach was attempted today to offer the patient with information about available care coordination services.  Follow Up Plan:  Additional outreach attempts will be made to offer the patient care coordination information and services.   Encounter Outcome:  No Answer  Gwenevere Ghazi  Care Coordination Care Guide  Direct Dial: [phone]

## 2023-10-09 ENCOUNTER — Encounter: Payer: Self-pay | Admitting: Physical Therapy

## 2023-10-09 ENCOUNTER — Ambulatory Visit: Payer: 59 | Attending: Internal Medicine | Admitting: Physical Therapy

## 2023-10-09 DIAGNOSIS — R269 Unspecified abnormalities of gait and mobility: Secondary | ICD-10-CM | POA: Insufficient documentation

## 2023-10-09 DIAGNOSIS — M25511 Pain in right shoulder: Secondary | ICD-10-CM | POA: Diagnosis not present

## 2023-10-09 DIAGNOSIS — M25562 Pain in left knee: Secondary | ICD-10-CM | POA: Diagnosis not present

## 2023-10-09 DIAGNOSIS — G8929 Other chronic pain: Secondary | ICD-10-CM | POA: Insufficient documentation

## 2023-10-09 DIAGNOSIS — W19XXXA Unspecified fall, initial encounter: Secondary | ICD-10-CM | POA: Diagnosis not present

## 2023-10-09 DIAGNOSIS — M6281 Muscle weakness (generalized): Secondary | ICD-10-CM | POA: Insufficient documentation

## 2023-10-09 DIAGNOSIS — R2681 Unsteadiness on feet: Secondary | ICD-10-CM | POA: Insufficient documentation

## 2023-10-09 DIAGNOSIS — M25512 Pain in left shoulder: Secondary | ICD-10-CM | POA: Insufficient documentation

## 2023-10-09 DIAGNOSIS — M25561 Pain in right knee: Secondary | ICD-10-CM | POA: Diagnosis not present

## 2023-10-09 NOTE — Therapy (Signed)
OUTPATIENT PHYSICAL THERAPY NEURO EVALUATION   Patient Name: Leonard Tucker MRN: [id number] DOB:September 01, 1956, 67 y.o., male Today's Date: 10/10/2023   PCP: Morrie Sheldon, MD REFERRING PROVIDER: Mercie Eon, MD  END OF SESSION:  PT End of Session - 10/09/23 1538     Visit Number 1    Number of Visits 5    Date for PT Re-Evaluation 11/21/23    Authorization Type [employer] Medicare    PT Start Time 1532    PT Stop Time 1615    PT Time Calculation (min) 43 min    Equipment Utilized During Treatment Gait belt    Activity Tolerance Patient tolerated treatment well    Behavior During Therapy WFL for tasks assessed/performed             Past Medical History:  Diagnosis Date   Abscess of buttock, right 08/26/2022   Chronic constipation    Deaf-mutism    Dental caries    History of iron deficiency anemia    Hypertriglyceridemia    Intellectual disability    Intertrigo    Schizophrenia, paranoid, chronic (HCC)    Umbilical hernia    History reviewed. No pertinent surgical history. Patient Active Problem List   Diagnosis Date Noted   Diabetes (HCC) 09/30/2023   Fall 09/30/2023   Prediabetes 07/01/2023   Obesity (BMI 30.0-34.9) 07/01/2023   Blindness of left eye 01/10/2023   Hx SBO 01/01/2023   Pneumatosis of intestines 03/19/2022   Seasonal allergies 03/21/2021   External hemorrhoids 09/22/2020   Iron deficiency anemia due to chronic blood loss 11/01/2011   GERD (gastroesophageal reflux disease) 06/28/2011   Constipation 05/19/2007   Dyslipidemia 11/18/2006   PARANOID SCHIZOPHRENIA, CHRONIC 10/13/2006   MENTAL RETARDATION 10/13/2006   DEAF MUTISM 10/13/2006   Essential hypertension 10/13/2006   Dental caries 10/13/2006    ONSET DATE: 09/30/2023 (referral date)  REFERRING DIAG: W19.Lorne Skeens (ICD-10-CM) - Fall, initial encounter  THERAPY DIAG:  Abnormality of gait and mobility - Plan: PT plan of care cert/re-cert  Pain in both knees, unspecified  chronicity - Plan: PT plan of care cert/re-cert  Unsteadiness on feet - Plan: PT plan of care cert/re-cert  Chronic pain of both shoulders - Plan: PT plan of care cert/re-cert  Muscle weakness (generalized) - Plan: PT plan of care cert/re-cert  Rationale for Evaluation and Treatment: Rehabilitation  SUBJECTIVE:                                                                                                                                                                                             SUBJECTIVE STATEMENT:  Patient reports that he  is has knee pain and weakness that has resulted in multiple falls. Sister later clarifies that only one recent fall but has had more in the past. Patient reports that he also has joint pain in shoulders and back. 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  Pt accompanied by: self and sister and interpreters (American Sign Language - Dewayne Hatch [phone] and CDI-Certified Deaf Interpreter - [phone])   PERTINENT HISTORY: schizophrenia, developmental delay per family impacting cognition, deaf and hard of hearing  PAIN:  Are you having pain?  Yes - unable to rate but reports knee pain is quite a bit, walking for long period of time makes worse  PRECAUTIONS: Fall  RED FLAGS: None   WEIGHT BEARING RESTRICTIONS: No  FALLS: Has patient fallen in last 6 months? Yes. Number of falls exact number unknown but 1 fall most recently due to knees giving out  LIVING ENVIRONMENT: Lives with: lives with their family Lives in: House/apartment Stairs:  unknown - not captured on eval Has following equipment at home:  mother has cane otherwise none  PLOF: Needs assistance with ADLs and Needs assistance with homemaking  PATIENT GOALS: "Work on pain in knees."   OBJECTIVE:  Note: Objective measures were completed at Evaluation unless otherwise  noted.  DIAGNOSTIC FINDINGS: No relevant recent imaging   COGNITION: Overall cognitive status: Impaired - per sister  EDEMA:  None noted on eval  LOWER EXTREMITY ROM:     Unable to formally assess due to patient cognition, mild muscle tightness functionally noted in shoulder and knee extension but grossly WFL  LOWER EXTREMITY MMT:    Unable to formally assess due to patient cognition, strength appears to be limited in part by pain  TRANSFERS: Assistive device utilized: None  Sit to stand: SBA Stand to sit: SBA Chair to chair: SBA  GAIT: Gait pattern: step through pattern, decreased hip/knee flexion- Right, decreased hip/knee flexion- Left, decreased ankle dorsiflexion- Right, decreased ankle dorsiflexion- Left, lateral hip instability, and wide BOS Distance walked: various short clinic distances, ~ 1 x 60 feet Assistive device utilized: None Level of assistance: SBA Comments: See above  FUNCTIONAL TESTS:   [facility] PT Assessment - 10/10/23 0001       Standardized Balance Assessment   Standardized Balance Assessment Five Times Sit to Stand    Five times sit to stand comments  22.89   seconds without UE use (SBA)             PATIENT SURVEYS:  Not performed due to cognition   TODAY'S TREATMENT:                                                                                                                                Initial eval only  PATIENT EDUCATION: Education details: POC, goal collaboration, examination findings Person educated: Patient Education method:  Explanation and Verbal cues Education comprehension: verbalized understanding  HOME EXERCISE PROGRAM: To be provided   GOALS: Goals reviewed with patient? Yes  LONG TERM GOALS: Target date: 11/21/2023 (STG = LTG due to POC length)   Patient will report demonstrate independence with final HEP in order to maintain current gains and continue to progress after physical therapy discharge.   Baseline:   Goal status: INITIAL  2.  Patient will improve 5x Sit to Stand score to 18 seconds or less to demonstrate a minimal clinically important difference towards a decreased  risk for falls and improved lower extremity strength.   Baseline: 22.89 seconds Goal status: INITIAL  3.  TUG to be assessed / LTG written Baseline: To be assessed Goal status: INITIAL  ASSESSMENT:  CLINICAL IMPRESSION: Patient is a 67 y.o. male who was seen today for physical therapy evaluation and treatment for impairments secondary to a fall with PMH of schizophrenia, developmental delay per family impacting cognition, and deafness. Patient knows partial ASL in addition to other signs that family and CDI are able to help interpret. [employer] interpreters used during session. Session limited  in time today due to need for multiple levels of interpretation and no in person interpreter but plan to have present in future sessions as able and otherwise continue with use of [employer]. Patient does present with some mild ROM restriction in gait/muscular tightness in addition to functional weakness with FxSTS. Patient reports pain particularly in knees is largest contributor to falls and also reports pain in shoulders and elbows. Patient currently ambulates without AD with SBA. Patient will benefit from skilled physical therapy to decrease falls risk and progress towards LTGs.   OBJECTIVE IMPAIRMENTS: Abnormal gait, decreased balance, difficulty walking, decreased ROM, decreased strength, and pain.   ACTIVITY LIMITATIONS: squatting, transfers, and locomotion level  PARTICIPATION LIMITATIONS: cleaning and community activity  PERSONAL FACTORS: Past/current experiences, Social background, and 3+ comorbidities: see above  are also affecting patient's functional outcome.   REHAB POTENTIAL: Fair limited by cognition  CLINICAL DECISION MAKING: Evolving/moderate complexity  EVALUATION COMPLEXITY: Moderate  PLAN:  PT FREQUENCY:  1x/week  PT DURATION: 4 weeks  PLANNED INTERVENTIONS: 97164- PT Re-evaluation, 97110-Therapeutic exercises, 97530- Therapeutic activity, 97112- Neuromuscular re-education, 97535- Self Care, 81191- Manual therapy, and 97116- Gait training  PLAN FOR NEXT SESSION: assess TUG and write LTG, work on HEP to help limit knee pain and work on general function and mobility and decrease risk for falls  Carmelia Bake, PT, DPT 10/10/2023, 9:49 AM

## 2023-10-10 ENCOUNTER — Encounter: Payer: Self-pay | Admitting: Physical Therapy

## 2023-10-10 NOTE — Progress Notes (Unsigned)
  Care Coordination  Outreach Note  10/10/2023 Name: Leonard Tucker MRN: [id number] DOB: December 19, 1955   Care Coordination Outreach Attempts: A third unsuccessful outreach was attempted today to offer the patient with information about available care coordination services.  Follow Up Plan:  Additional outreach attempts will be made to offer the patient care coordination information and services.   Encounter Outcome:  Not available   [facility] Coordination Care Guide  Direct Dial: [phone]

## 2023-10-13 NOTE — Progress Notes (Signed)
  Care Coordination   Note   10/13/2023 Name: Leonard Tucker MRN: [id number] DOB: 26-Jan-1956  Leonard Tucker is a 67 y.o. year old male who sees Morrie Sheldon, MD for primary care. I reached out to Dannielle Burn by phone today to offer care coordination services.  Mr. Shute was given information about Care Coordination services today including:   The Care Coordination services include support from the care team which includes your Nurse Coordinator, Clinical Social Worker, or Pharmacist.  The Care Coordination team is here to help remove barriers to the health concerns and goals most important to you. Care Coordination services are voluntary, and the patient may decline or stop services at any time by request to their care team member.   Care Coordination Consent Status: Patient mother Leonard Tucker  agreed to services and verbal consent obtained.   Follow up plan:  Telephone appointment with care coordination team member scheduled for:  10/27/23  Encounter Outcome:  Patient Scheduled  [facility] Coordination Care Guide  Direct Dial: [phone]

## 2023-10-17 ENCOUNTER — Ambulatory Visit: Payer: 59 | Admitting: Physical Therapy

## 2023-10-23 IMAGING — DX DG ABD PORTABLE 1V
3 series · 3 of 3 positions shown · non-contrast
Comparison: Previous radiograph done on 03/18/2022 and CT done on
03/19/2022

CLINICAL DATA: Small-bowel obstruction

EXAM:
PORTABLE ABDOMEN - 1 VIEW

[abdomen supine (1 of 3)]
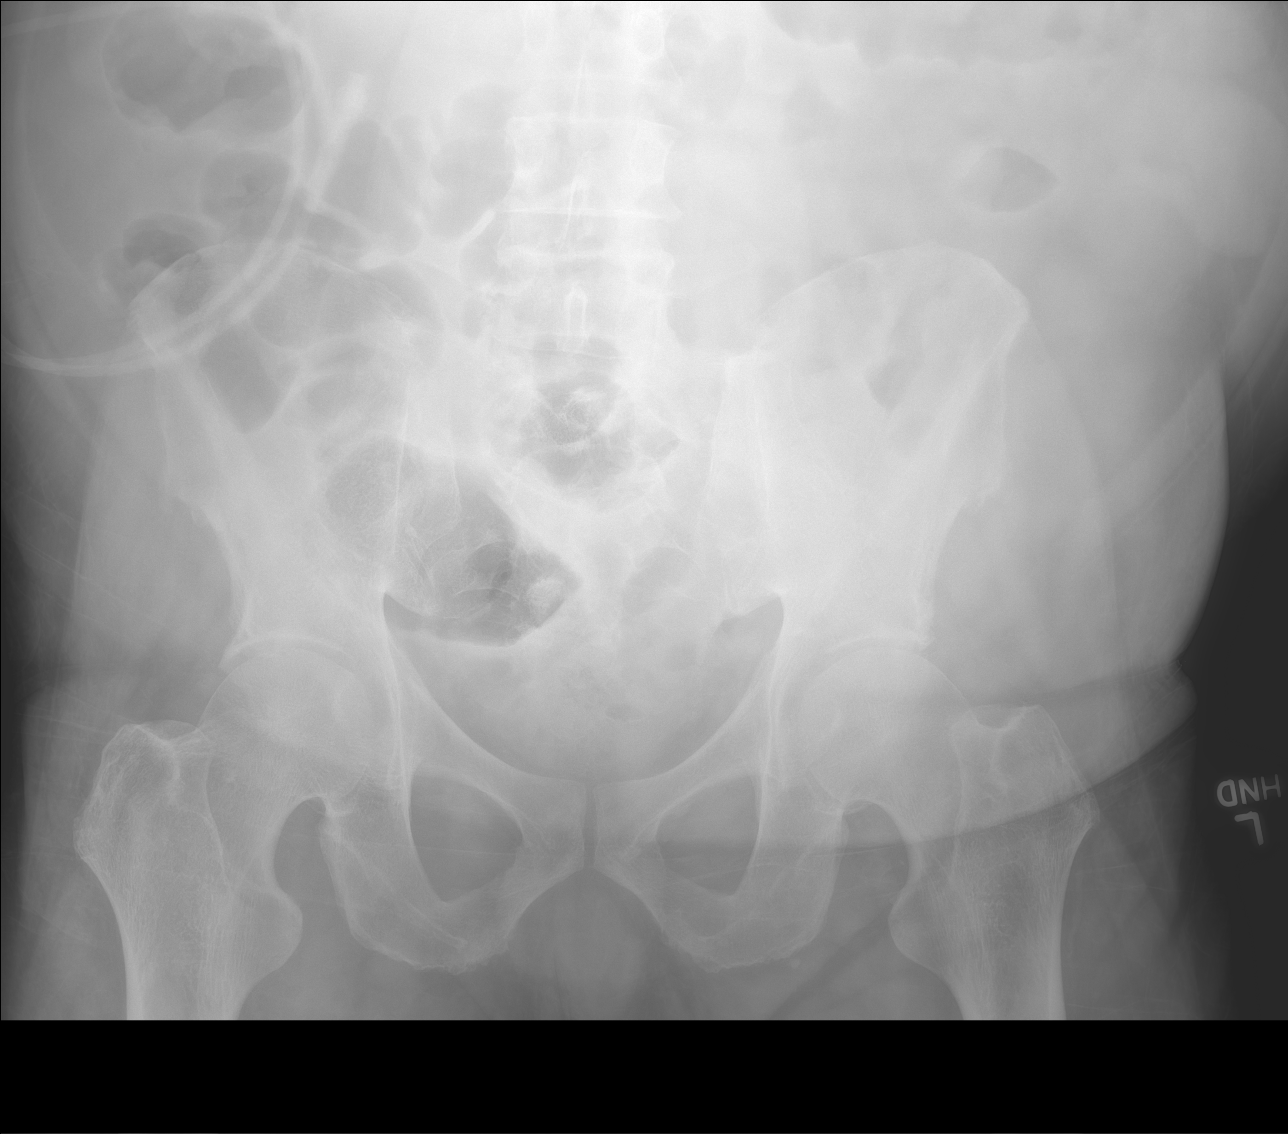

[abdomen supine (2 of 3)]
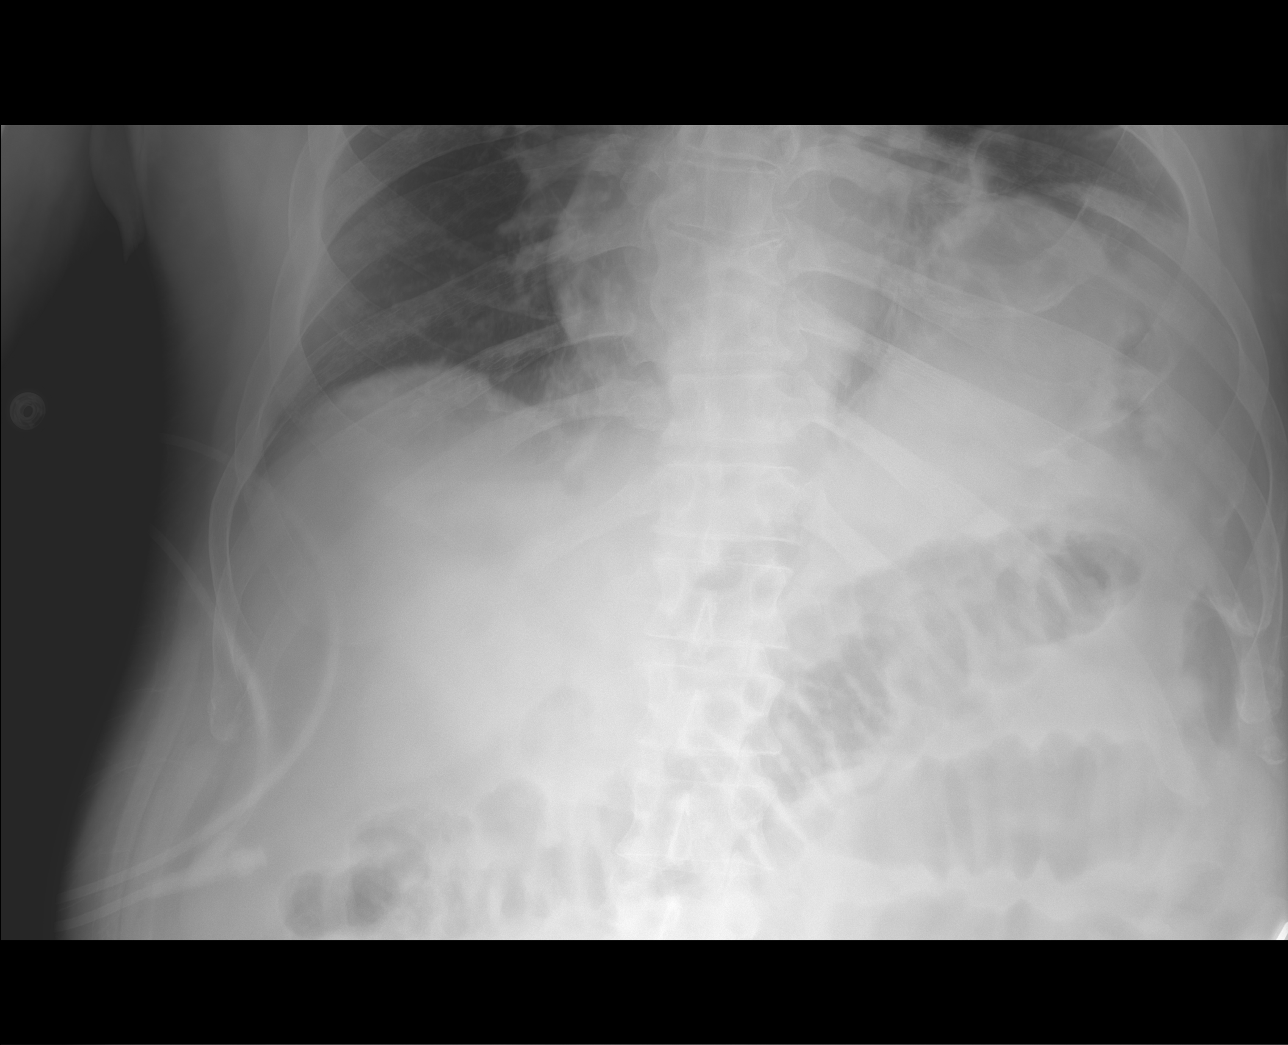

[abdomen supine (3 of 3)]
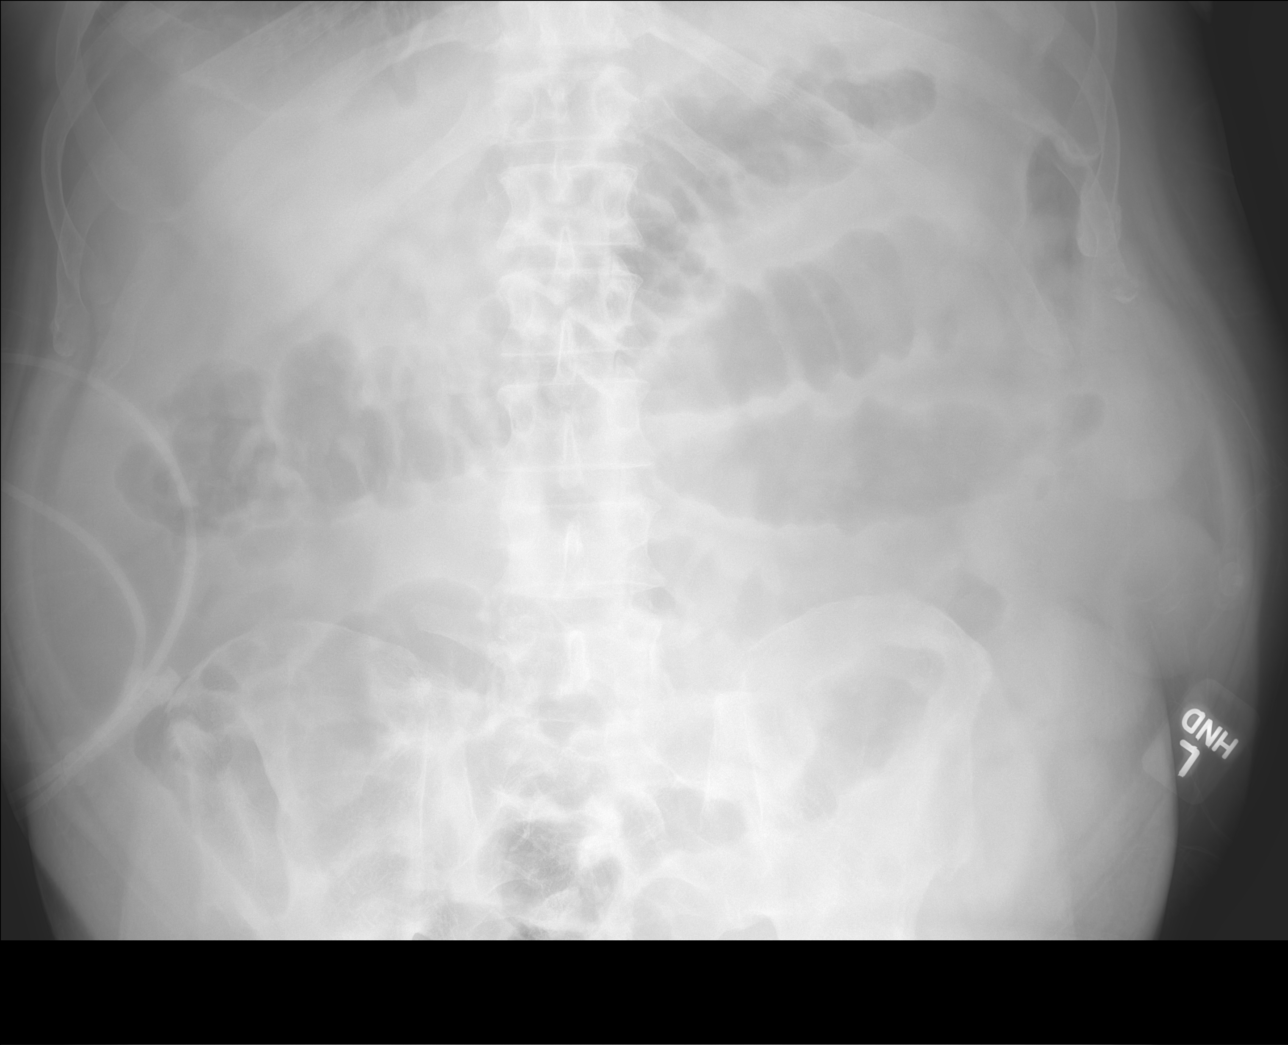

[3 of 3 positions shown; findings below may reference images not displayed]

FINDINGS: There is interval decrease in degree of small bowel dilation. There
are dilated small bowel loops in the left mid abdomen measuring up
to 5 cm in diameter. Gas is present in colon. Left hemidiaphragm is
elevated. Linear densities seen in the left lower lung fields
suggesting subsegmental atelectasis. There is possible decrease in
height of body of T9 vertebra.
IMPRESSION: There is decrease in degree of small bowel dilation. Still, there is
moderate to marked distention of small-bowel loops in the left mid
abdomen measuring up to 5 cm in diameter.

There are linear densities in the left lower lung fields suggesting
subsegmental atelectasis. There is possible decrease in height of
body of T9 vertebra.

## 2023-10-24 ENCOUNTER — Ambulatory Visit: Payer: 59 | Admitting: Physical Therapy

## 2023-10-24 VITALS — BP 127/79

## 2023-10-24 DIAGNOSIS — M25562 Pain in left knee: Secondary | ICD-10-CM

## 2023-10-24 DIAGNOSIS — R269 Unspecified abnormalities of gait and mobility: Secondary | ICD-10-CM

## 2023-10-24 DIAGNOSIS — R2681 Unsteadiness on feet: Secondary | ICD-10-CM

## 2023-10-24 DIAGNOSIS — G8929 Other chronic pain: Secondary | ICD-10-CM

## 2023-10-24 DIAGNOSIS — M6281 Muscle weakness (generalized): Secondary | ICD-10-CM

## 2023-10-24 NOTE — Therapy (Signed)
OUTPATIENT PHYSICAL THERAPY NEURO ARRIVE NO CHARGE / DISCHARGE   Patient Name: Leonard Tucker MRN: [id number] DOB:[birth year], 67 y.o., male Today's Date: 10/24/2023   PCP: Morrie Sheldon, MD REFERRING PROVIDER: Mercie Eon, MD  PHYSICAL THERAPY DISCHARGE SUMMARY  Visits from Start of Care: 1  Current functional level related to goals / functional outcomes: Reporting no pain at this time   Remaining deficits: Family requesting language services for interpreting    Education / Equipment: Provided resources as noted below:  Services for deaf and hard of hearing under state of department of human services - [facility]  Communication services for deaf and hard of hearing  Patient agrees to discharge. Patient goals were  not assessed as patient reporting no need for PT at this time . Patient is being discharged due to  stating no longer has a need for PT at this time but requesting communication services from both patient and family.   END OF SESSION:  PT End of Session - 10/24/23 1535     Visit Number 1    Number of Visits 5    Date for PT Re-Evaluation 11/21/23    Authorization Type [employer]    PT Start Time 1450    PT Stop Time 1520    PT Time Calculation (min) 30 min    Equipment Utilized During Treatment Other (comment)   performed at mat level   Activity Tolerance Patient tolerated treatment well    Behavior During Therapy [facility] for tasks assessed/performed             Past Medical History:  Diagnosis Date   Abscess of buttock, right 08/26/2022   Chronic constipation    Deaf-mutism    Dental caries    History of iron deficiency anemia    Hypertriglyceridemia    Intellectual disability    Intertrigo    Schizophrenia, paranoid, chronic (HCC)    Umbilical hernia    No past surgical history on file. Patient Active Problem List   Diagnosis Date Noted   Diabetes (HCC) 09/30/2023   Fall 09/30/2023   Prediabetes 07/01/2023   Obesity (BMI  30.0-34.9) 07/01/2023   Blindness of left eye 01/10/2023   Hx SBO 01/01/2023   Pneumatosis of intestines 03/19/2022   Seasonal allergies 03/21/2021   External hemorrhoids 09/22/2020   Iron deficiency anemia due to chronic blood loss 11/01/2011   GERD (gastroesophageal reflux disease) 06/28/2011   Constipation 05/19/2007   Dyslipidemia 11/18/2006   PARANOID SCHIZOPHRENIA, CHRONIC 10/13/2006   MENTAL RETARDATION 10/13/2006   DEAF MUTISM 10/13/2006   Essential hypertension 10/13/2006   Dental caries 10/13/2006    ONSET DATE: 09/30/2023 (referral date)  REFERRING DIAG: W19.Lorne Skeens (ICD-10-CM) - Fall, initial encounter  THERAPY DIAG:  Abnormality of gait and mobility  Pain in both knees, unspecified chronicity  Unsteadiness on feet  Chronic pain of both shoulders  Muscle weakness (generalized)  Rationale for Evaluation and Treatment: Rehabilitation  SUBJECTIVE:  SUBJECTIVE STATEMENT:   Pt reporting no pain in today's session; communication remains limited as noted from eval. Pt and family r  requesting D/C from PT but would like information for communication services.   Pt accompanied by: self and sister and interpreter ASL Corrie Dandy)   PERTINENT HISTORY: schizophrenia, developmental delay per family impacting cognition, deaf and hard of hearing  PAIN:  Are you having pain? No  PRECAUTIONS: Fall  RED FLAGS: None   WEIGHT BEARING RESTRICTIONS: No  FALLS: Has patient fallen in last 6 months? Yes. Number of falls exact number unknown but 1 fall most recently due to knees giving out  LIVING ENVIRONMENT: Lives with: lives with their family Lives in: House/apartment Stairs:  unknown - not captured on eval Has following equipment at home:  mother has cane otherwise none  PLOF: Needs  assistance with ADLs and Needs assistance with homemaking  PATIENT GOALS: "Work on pain in knees."   OBJECTIVE:  Note: Objective measures were completed at Evaluation unless otherwise noted.  DIAGNOSTIC FINDINGS: No relevant recent imaging   COGNITION: Overall cognitive status: Impaired - per sister  TODAY'S TREATMENT:                                                                                                                               Vitals:   10/24/23 1501  BP: 127/79   No charge as not requesting PT services at this time. Patient and sister provided with printout for information with printout for Services for deaf and hard of hearing under state of department of human services - [place] and Communication services for deaf and hard of hearing.  PATIENT EDUCATION: Education details: As noted above Person educated: Patient Education method: Leisure centre manager cues Education comprehension: verbalized understanding  HOME EXERCISE PROGRAM: To be provided   GOALS: Goals reviewed with patient? Yes  LONG TERM GOALS: Target date: 11/21/2023 (STG = LTG due to POC length)   Patient will report demonstrate independence with final HEP in order to maintain current gains and continue to progress after physical therapy discharge.   Baseline:  Goal status: DISCONTINUED  2.  Patient will improve 5x Sit to Stand score to 18 seconds or less to demonstrate a minimal clinically important difference towards a decreased  risk for falls and improved lower extremity strength.   Baseline: 22.89 seconds Goal status: DISCONTINUED  3.  TUG to be assessed / LTG written Baseline: To be assessed Goal status: DISCONTINUED  ASSESSMENT:  CLINICAL IMPRESSION: Session arrive no charge as patient and sister stating do want PT at this time and patient's knees are no longer hurting but are hoping to get some resources for communication services. PT with help of ASL interpreter provides  patient with community resources and will inform referring provider as well. Patient D/C per request.   OBJECTIVE IMPAIRMENTS: Abnormal gait, decreased balance, difficulty walking, decreased ROM, decreased strength, and pain.   ACTIVITY LIMITATIONS: squatting, transfers, and  locomotion level  PARTICIPATION LIMITATIONS: cleaning and community activity  PERSONAL FACTORS: Past/current experiences, Social background, and 3+ comorbidities: see above  are also affecting patient's functional outcome.   REHAB POTENTIAL: Fair limited by cognition  CLINICAL DECISION MAKING: Evolving/moderate complexity  EVALUATION COMPLEXITY: Moderate  PLAN:  D/C with information for follow up with communication services  Carmelia Bake, PT, DPT 10/24/2023, 3:47 PM

## 2023-10-27 ENCOUNTER — Other Ambulatory Visit (HOSPITAL_COMMUNITY): Payer: Self-pay | Admitting: *Deleted

## 2023-10-27 ENCOUNTER — Ambulatory Visit: Payer: Self-pay | Admitting: Licensed Clinical Social Worker

## 2023-10-27 NOTE — Patient Instructions (Signed)
Visit Information  Thank you for taking time to visit with me today. Please don't hesitate to contact me if I can be of assistance to you.   Following are the goals we discussed today:   Goals Addressed             This Visit's Progress    Care Coordination Activities       Care Coordination Interventions: Sw spoke to the mother (Ms. Hollands) and the patients Leonard Tucker) and the SW went over the SDOH questions and no concerns. Patient lives with his mother, he is deaf and he does receive Food stamps and social security. The family would like a program for the patient to get out during the day to socialize with others like himself. The SW discussed the [employer], the Family declined that and stated that they were just given information on Friday about Day Programs in the community and the family will be looking over the information and seeing what programs are available. The information was provided from [facility] on [address].  The SW will follow up with the family on 11/11/2023 at 10:30 am         Our next appointment is by telephone on 11/11/2023 at 10:30 am  Please call the care guide team at [phone] if you need to cancel or reschedule your appointment.   If you are experiencing a Mental Health or Behavioral Health Crisis or need someone to talk to, please call the Suicide and Crisis Lifeline: 988 go to [facility] [address] [place] [phone]) call 911  Patient verbalizes understanding of instructions and care plan provided today and agrees to view in MyChart. Active MyChart status and patient understanding of how to access instructions and care plan via MyChart confirmed with patient.      Jeanie Cooks, PhD [facility], [facility] Social Worker Direct Dial: [phone]  Fax: [phone]

## 2023-10-27 NOTE — Patient Outreach (Signed)
  Care Coordination   Initial Visit Note   10/27/2023 Name: FINNLY BRIETZKE MRN: [id number] DOB: Oct 16, 1956  JACKSON GONYER is a 67 y.o. year old male who sees Morrie Sheldon, MD for primary care. I spoke with  Dannielle Burn' mother (Ms. Fuess ) and the patient's sister West Carbo) by phone today.  What matters to the patients health and wellness today?  Day program for the patient    Goals Addressed             This Visit's Progress    Care Coordination Activities       Care Coordination Interventions: Sw spoke to the mother (Ms. Alfieri) and the patients [place] Carbo) and the SW went over the SDOH questions and no concerns. Patient lives with his mother, he is deaf and he does receive Food stamps and social security. The family would like a program for the patient to get out during the day to socialize with others like himself. The SW discussed the [employer], the Family declined that and stated that they were just given information on Friday about Day Programs in the community and the family will be looking over the information and seeing what programs are available. The information was provided from [facility] on [address].  The SW will follow up with the family on 11/11/2023 at 10:30 am         SDOH assessments and interventions completed:  Yes  SDOH Interventions Today    Flowsheet Row Most Recent Value  SDOH Interventions   Food Insecurity Interventions Intervention Not Indicated  [Patient receives food stamps]  Housing Interventions Intervention Not Indicated  [lives with his mother]  Transportation Interventions Intervention Not Indicated  Utilities Interventions Intervention Not Indicated        Care Coordination Interventions:  Yes, provided  Interventions Today    Flowsheet Row Most Recent Value  General Interventions   General Interventions Discussed/Reviewed General Interventions Discussed, [employer] needs a  day program  to get around other people and to socialize]        Follow up plan: Follow up call scheduled for 11/11/2023 at 10:30 am    Encounter Outcome:  Patient Visit Completed   Jeanie Cooks, PhD [facility], [facility] Social Worker Direct Dial: [phone]  Fax: [phone]

## 2023-10-29 ENCOUNTER — Telehealth: Payer: Self-pay | Admitting: *Deleted

## 2023-10-29 NOTE — Telephone Encounter (Signed)
Received call from pt's sister regarding IV Iron Infusion appt States she has not been contacted with an appt.  Pcp placed AMB REFERRAL TO INTRAVENOUS IRON THERAPY on 10/30 Order will need further clarification before it can be scheduled.  Also dicussed with attending MD as there has been some recent changes on how to order this procedure in Epic.  CMA also contacted Infusion Center at [phone] for assistance-no answer, message was left on recorder.

## 2023-11-04 ENCOUNTER — Telehealth: Payer: Self-pay | Admitting: Pharmacy Technician

## 2023-11-04 NOTE — Telephone Encounter (Signed)
Patient referred to infusion pharmacy team for ambulatory infusion of IV iron.  Insurance - [employer] Dual Complete Dx code - D50.0 IV Iron Therapy - Feraheme Infusion appointments - Scheduling team will schedule patient as soon as possible.

## 2023-11-04 NOTE — Telephone Encounter (Signed)
Auth Submission: NO AUTH NEEDED Site of care: Site of care: [facility] Payer: UHC DUAL Medication & CPT/J Code(s) submitted: Feraheme (ferumoxytol) [id number] Route of submission (phone, fax, portal):  Phone # Fax # Auth type: Buy/Bill PB Units/visits requested: 2 Reference number:  Approval from: 11/04/23 to 01/02/24

## 2023-11-04 NOTE — Addendum Note (Signed)
Addended by: Maura Crandall on: 11/04/2023 09:14 AM   Modules accepted: Orders

## 2023-11-07 ENCOUNTER — Ambulatory Visit: Payer: 59 | Admitting: Physical Therapy

## 2023-11-07 ENCOUNTER — Other Ambulatory Visit: Payer: Self-pay | Admitting: *Deleted

## 2023-11-11 ENCOUNTER — Ambulatory Visit: Payer: Self-pay | Admitting: Licensed Clinical Social Worker

## 2023-11-11 NOTE — Patient Outreach (Signed)
  Care Coordination   Follow Up Visit Note   11/11/2023 Name: Leonard Tucker MRN: [id number] DOB: December 12, 1955  Leonard Tucker is a 67 y.o. year old male who sees Morrie Sheldon, MD for primary care. I spoke with  Dannielle Burn by phone today.  What matters to the patients health and wellness today?  Social Programs in the community     Goals Addressed             This Visit's Progress    COMPLETED: Care Coordination Activities       Care Coordination Interventions: Sw spoke to the mother (Ms. Rieker) and the patients  sister West Carbo) and the SW went over the SDOH questions and no concerns. Patient lives with his mother, he is deaf and he does receive Food stamps and social security. The family would like a program for the patient to get out during the day to socialize with others like himself. The SW discussed the [employer], the Family declined that and stated that they were just given information on Friday about Day Programs in the community and the family will be looking over the information and seeing what programs are available. The information was provided from [facility] on [address].  The SW will follow up with the family on 11/11/2023 at 10:30 am         SDOH assessments and interventions completed:  Yes  SDOH Interventions Today    Flowsheet Row Most Recent Value  SDOH Interventions   Food Insecurity Interventions Intervention Not Indicated  Housing Interventions Intervention Not Indicated  Transportation Interventions Intervention Not Indicated  Utilities Interventions Intervention Not Indicated        Care Coordination Interventions:  Yes, provided  Interventions Today    Flowsheet Row Most Recent Value  General Interventions   General Interventions Discussed/Reviewed General Interventions Reviewed, Community Resources  [Patient's family will get him signed up for some of the programs that were mailed to them.]        Follow  up plan: No further intervention required.   Encounter Outcome:  Patient Visit Completed   Jeanie Cooks, PhD [facility], [facility] Social Worker Direct Dial: [phone]  Fax: [phone]

## 2023-11-11 NOTE — Patient Instructions (Signed)
Visit Information  Thank you for taking time to visit with me today. Please don't hesitate to contact me if I can be of assistance to you.   Following are the goals we discussed today:   Goals Addressed             This Visit's Progress    COMPLETED: Care Coordination Activities       Care Coordination Interventions: Sw spoke to the mother (Ms. Garvis) and the patients  sister West Carbo) and the SW went over the SDOH questions and no concerns. Patient lives with his mother, he is deaf and he does receive Food stamps and social security. The family would like a program for the patient to get out during the day to socialize with others like himself. The SW discussed the [employer], the Family declined that and stated that they were just given information on Friday about Day Programs in the community and the family will be looking over the information and seeing what programs are available. The information was provided from [facility] on [address].  The SW will follow up with the family on 11/11/2023 at 10:30 am         No further follow up needed  Please call the care guide team at [phone] if you need to cancel or reschedule your appointment.   If you are experiencing a Mental Health or Behavioral Health Crisis or need someone to talk to, please call the Suicide and Crisis Lifeline: 988 go to [facility] [address] [place] [phone]) call 911  The patient verbalized understanding of instructions, educational materials, and care plan provided today and DECLINED offer to receive copy of patient instructions, educational materials, and care plan.   Jeanie Cooks, PhD [facility], [facility] Social Worker Direct Dial: [phone]  Fax: [phone]

## 2023-11-12 ENCOUNTER — Ambulatory Visit: Payer: 59

## 2023-11-12 VITALS — BP 106/69 | HR 66 | Temp 97.4°F | Resp 18 | Ht 66.5 in | Wt 206.0 lb

## 2023-11-12 DIAGNOSIS — D509 Iron deficiency anemia, unspecified: Secondary | ICD-10-CM

## 2023-11-12 DIAGNOSIS — D500 Iron deficiency anemia secondary to blood loss (chronic): Secondary | ICD-10-CM

## 2023-11-12 MED ORDER — SODIUM CHLORIDE 0.9 % IV SOLN
510.0000 mg | Freq: Once | INTRAVENOUS | Status: AC
  Administered 2023-11-12: 510 mg via INTRAVENOUS
  Filled 2023-11-12: qty 17

## 2023-11-12 MED ORDER — ACETAMINOPHEN 325 MG PO TABS
650.0000 mg | ORAL_TABLET | Freq: Once | ORAL | Status: AC
  Administered 2023-11-12: 650 mg via ORAL
  Filled 2023-11-12: qty 2

## 2023-11-12 MED ORDER — DIPHENHYDRAMINE HCL 25 MG PO CAPS
25.0000 mg | ORAL_CAPSULE | Freq: Once | ORAL | Status: AC
  Administered 2023-11-12: 25 mg via ORAL
  Filled 2023-11-12: qty 1

## 2023-11-12 NOTE — Progress Notes (Signed)
Diagnosis: Iron Deficiency Anemia  Provider:  Chilton Greathouse MD  Procedure: IV Infusion  IV Type: Peripheral, IV Location: L Antecubital  Feraheme (Ferumoxytol), Dose: 510 mg  Infusion Start Time: 1339  Infusion Stop Time: 1355  Post Infusion IV Care: Observation period completed and Peripheral IV Discontinued  Discharge: Condition: Good, Destination: Home . AVS Provided  Performed by:  Garnette Czech, RN

## 2023-11-14 ENCOUNTER — Ambulatory Visit: Payer: 59 | Admitting: Physical Therapy

## 2023-11-20 ENCOUNTER — Ambulatory Visit: Payer: 59 | Admitting: *Deleted

## 2023-11-20 VITALS — BP 110/74 | HR 74 | Temp 99.2°F | Resp 18 | Ht 66.0 in | Wt 207.2 lb

## 2023-11-20 DIAGNOSIS — D509 Iron deficiency anemia, unspecified: Secondary | ICD-10-CM

## 2023-11-20 DIAGNOSIS — D500 Iron deficiency anemia secondary to blood loss (chronic): Secondary | ICD-10-CM

## 2023-11-20 MED ORDER — ACETAMINOPHEN 325 MG PO TABS
650.0000 mg | ORAL_TABLET | Freq: Once | ORAL | Status: AC
  Administered 2023-11-20: 650 mg via ORAL
  Filled 2023-11-20: qty 2

## 2023-11-20 MED ORDER — SODIUM CHLORIDE 0.9 % IV SOLN
510.0000 mg | Freq: Once | INTRAVENOUS | Status: AC
Start: 2023-11-20 — End: 2023-11-20
  Administered 2023-11-20: 510 mg via INTRAVENOUS
  Filled 2023-11-20: qty 17

## 2023-11-20 MED ORDER — DIPHENHYDRAMINE HCL 25 MG PO CAPS
25.0000 mg | ORAL_CAPSULE | Freq: Once | ORAL | Status: AC
  Administered 2023-11-20: 25 mg via ORAL
  Filled 2023-11-20: qty 1

## 2023-11-20 NOTE — Progress Notes (Signed)
Diagnosis: Iron Deficiency Anemia  Provider:  Chilton Greathouse MD  Procedure: IV Infusion  IV Type: Peripheral, IV Location: L Antecubital  Feraheme (Ferumoxytol), Dose: 510 mg  Infusion Start Time: 1347 pm  Infusion Stop Time: 1405 pm  Post Infusion IV Care: Observation period completed and Peripheral IV Discontinued  Discharge: Condition: Good, Destination: Home . AVS Provided  Performed by:  Forrest Moron, RN

## 2023-12-09 ENCOUNTER — Other Ambulatory Visit: Payer: Self-pay

## 2023-12-09 DIAGNOSIS — I10 Essential (primary) hypertension: Secondary | ICD-10-CM

## 2023-12-09 MED ORDER — ATENOLOL 25 MG PO TABS: 25.0000 mg | ORAL_TABLET | Freq: Every day | ORAL | 3 refills | Status: DC

## 2023-12-17 ENCOUNTER — Encounter: Payer: Self-pay | Admitting: Pulmonary Disease

## 2023-12-18 ENCOUNTER — Ambulatory Visit (INDEPENDENT_AMBULATORY_CARE_PROVIDER_SITE_OTHER): Payer: 59 | Admitting: Student

## 2023-12-18 ENCOUNTER — Encounter: Payer: Self-pay | Admitting: Pulmonary Disease

## 2023-12-18 ENCOUNTER — Encounter: Payer: Self-pay | Admitting: Student

## 2023-12-18 VITALS — BP 114/75 | HR 88 | Temp 98.6°F | Ht 66.0 in | Wt 205.5 lb

## 2023-12-18 DIAGNOSIS — J069 Acute upper respiratory infection, unspecified: Secondary | ICD-10-CM

## 2023-12-18 LAB — RESP PANEL BY RT-PCR (RSV, FLU A&B, COVID)  RVPGX2
Influenza A by PCR: NEGATIVE
Influenza B by PCR: NEGATIVE
Resp Syncytial Virus by PCR: NEGATIVE
SARS Coronavirus 2 by RT PCR: NEGATIVE

## 2023-12-18 MED ORDER — OXYMETAZOLINE HCL 0.05 % NA SOLN: 1.0000 | Freq: Two times a day (BID) | NASAL | 0 refills | Status: AC

## 2023-12-18 MED ORDER — BENZONATATE 100 MG PO CAPS: 100.0000 mg | ORAL_CAPSULE | Freq: Three times a day (TID) | ORAL | 0 refills | Status: DC | PRN

## 2023-12-18 MED ORDER — FLUTICASONE PROPIONATE 50 MCG/ACT NA SUSP: 1.0000 | Freq: Every day | NASAL | 2 refills | Status: DC

## 2023-12-18 MED ORDER — GUAIFENESIN ER 600 MG PO TB12: 600.0000 mg | ORAL_TABLET | Freq: Two times a day (BID) | ORAL | 0 refills | Status: DC

## 2023-12-18 NOTE — Patient Instructions (Addendum)
Thank you, [name] for allowing [place] to provide your care today. Today we discussed your cough.   I have ordered the following labs for you:  Lab Orders         Resp panel by RT-PCR (RSV, Flu A&B, Covid) Anterior Nasal Swab      Tests ordered today:  None  Referrals ordered today:   Referral Orders  No referral(s) requested today     I have ordered the following medication/changed the following medications:   Stop the following medications: Medications Discontinued During This Encounter  Medication Reason   fluticasone (FLONASE) 50 MCG/ACT nasal spray Reorder     Start the following medications: Meds ordered this encounter  Medications   oxymetazoline (AFRIN NASAL SPRAY) 0.05 % nasal spray    Sig: Place 1 spray into both nostrils 2 (two) times daily for 3 days.    Dispense:  0.6 mL    Refill:  0   fluticasone (FLONASE) 50 MCG/ACT nasal spray    Sig: Place 1 spray into both nostrils daily.    Dispense:  9.9 mL    Refill:  2   benzonatate (TESSALON PERLES) 100 MG capsule    Sig: Take 1 capsule (100 mg total) by mouth 3 (three) times daily as needed for cough.    Dispense:  15 capsule    Refill:  0   guaiFENesin (MUCINEX) 600 MG 12 hr tablet    Sig: Take 1 tablet (600 mg total) by mouth 2 (two) times daily.    Dispense:  14 tablet    Refill:  0     Follow up: 7 days as needed   Remember:   - Your cough is likely due to a viral infection. We will try the following treatments to help your symptoms:   - Afrin spray (one spray in each nostril) twice a day for 3 days only  - Flonase spray (one spray in each nostril) daily - Sinus rinse (see handout)  - Tessalon perles one 100 mg capsule up to 3 times a day as needed for cough  - Guaifenesin/Mucinex 600 mg tablet twice a day for 7 days  - Take your temperature at home and write them down, bring in log with you at your next appointment   - Please make an appointment for 7 days. We will check on your symptoms  and determine if we need to do imaging or change your medications at that time.   - Please go to an urgent care or emergency department if you feel shortness of breath, you are not able to eat or drink, or you have a fever (temperature above 100.4 F) that does not improve with Tylenol as you may need more immediate treatment.    Should you have any questions or concerns please call the internal medicine clinic at [phone].     Sharaya Boruff Colbert Coyer, MD PGY-1 Internal Medicine Teaching Progam [facility]

## 2023-12-22 DIAGNOSIS — J069 Acute upper respiratory infection, unspecified: Secondary | ICD-10-CM

## 2023-12-22 HISTORY — DX: Acute upper respiratory infection, unspecified: J06.9

## 2023-12-22 NOTE — Progress Notes (Signed)
Acute Office Visit  Subjective:     Patient ID: Leonard Tucker, male    DOB: 1956-09-20, 68 y.o.   MRN: [id number]  Chief Complaint  Patient presents with   Follow-up    Cough -(especially at night) for about 1 month    Patient is a 68 y.o. with a past medical history stated below who presents today for an acute office visit for cough and URI symptoms. Please see problem based assessment and plan for additional details.    Patient is accompanied by his sister, Martie Lee, who can help interpret (sign language). Declined formal ASL interpreter.    Review of Systems  Constitutional:  Positive for malaise/fatigue. Negative for fever.  HENT:  Positive for congestion and sinus pain. Negative for ear discharge and ear pain.   Respiratory:  Positive for cough. Negative for hemoptysis, shortness of breath and wheezing.   Cardiovascular:  Negative for chest pain and palpitations.  Skin:  Negative for rash.  Neurological:  Negative for headaches.      Objective:    BP 114/75 (BP Location: Left Arm, Patient Position: Sitting, Cuff Size: Normal)   Pulse 88   Temp 98.6 F (37 C) (Oral)   Ht 5\' 6"  (1.676 m)   Wt 205 lb 8 oz (93.2 kg)   SpO2 98%   BMI 33.17 kg/m    Physical Exam HENT:     Head: Normocephalic and atraumatic.     Right Ear: Tympanic membrane, ear canal and external ear normal.     Left Ear: Tympanic membrane, ear canal and external ear normal.     Ears:     Comments: Visible scarring on TM bilaterally     Nose: Congestion and rhinorrhea present.     Mouth/Throat:     Mouth: Mucous membranes are moist.     Pharynx: No oropharyngeal exudate or posterior oropharyngeal erythema.  Eyes:     Extraocular Movements: Extraocular movements intact.     Pupils: Pupils are equal, round, and reactive to light.  Neck:     Comments: Submandibular, preauricular, postauricular lymphadenopathy negative Cardiovascular:     Rate and Rhythm: Normal rate and regular rhythm.      Pulses: Normal pulses.     Heart sounds: Normal heart sounds.  Pulmonary:     Effort: Pulmonary effort is normal.     Breath sounds: Normal breath sounds.  Abdominal:     Palpations: Abdomen is soft.     Tenderness: There is no abdominal tenderness.  Musculoskeletal:     Cervical back: Normal range of motion.  Skin:    General: Skin is warm and dry.  Neurological:     Mental Status: He is alert. Mental status is at baseline.  Psychiatric:        Mood and Affect: Mood normal.        Behavior: Behavior normal.    Results for orders placed or performed in visit on 12/18/23  Resp panel by RT-PCR (RSV, Flu A&B, Covid) Anterior Nasal Swab   Specimen: Anterior Nasal Swab  Result Value Ref Range   SARS Coronavirus 2 by RT PCR NEGATIVE NEGATIVE   Influenza A by PCR NEGATIVE NEGATIVE   Influenza B by PCR NEGATIVE NEGATIVE   Resp Syncytial Virus by PCR NEGATIVE NEGATIVE      Assessment & Plan:   Problem List Items Addressed This Visit     Upper respiratory infection with cough and congestion   Patient with about a 1 month history  of upper airway congestion and cough. Has felt hot but denies fever or headache. Lives with family members, shares room with mother who was recently hospitalized for pneumonia. States cough is worse at night, has noticed some light green/white sputum. Denies chest pain, heart palpitations, SOB, hemoptysis, n/v, abdominal pain, recent travel, dysuria, or rash. More recently has started experiencing some discomfort with swallowing but has been eating and drinking fine. HDS today, BP 114/75, saturating 98% on room air, and oral temperature 95.6 F. Appears to be euvolemic on exam. HENT, CV, and pulmonary exam unremarkable aside from upper respiratory symptoms like rhinorrhea, and upper airway congestion. Has been taking cough drops with minimal improvement. Has avoided Robitussin DM due to adverse effect to DM (agitation), but has also avoided regular Robitussin.  Discussed conservative management for what is most likely a viral URI with cough and congestions. Explained strict return precautions and plan, patient and sister agreeable.   Plan - RVP negative for COVID, flu, and RSV - Afrin spray (one spray in each nostril) twice a day for 3 days only  - Flonase spray (one spray in each nostril) daily - Sinus rinse (provided patient handout)  - Tessalon perles one 100 mg capsule up to 3 times a day as needed for cough  - Guaifenesin/Mucinex 600 mg tablet twice a day for 7 days  - Instructed to take temperature at home and write them down if feeling hot, bring in log at next appointment  - Advised to use pulse ox if feeling SOB - Return to clinic in 7 days if minimal improvement or worsening of symptoms to help determine if CXR or further management is needed      Other Visit Diagnoses       Upper respiratory tract infection, unspecified type    -  Primary   Relevant Orders   Resp panel by RT-PCR (RSV, Flu A&B, Covid) Anterior Nasal Swab (Completed)       Meds ordered this encounter  Medications   oxymetazoline (AFRIN NASAL SPRAY) 0.05 % nasal spray    Sig: Place 1 spray into both nostrils 2 (two) times daily for 3 days.    Dispense:  0.6 mL    Refill:  0   fluticasone (FLONASE) 50 MCG/ACT nasal spray    Sig: Place 1 spray into both nostrils daily.    Dispense:  9.9 mL    Refill:  2   benzonatate (TESSALON PERLES) 100 MG capsule    Sig: Take 1 capsule (100 mg total) by mouth 3 (three) times daily as needed for cough.    Dispense:  15 capsule    Refill:  0   guaiFENesin (MUCINEX) 600 MG 12 hr tablet    Sig: Take 1 tablet (600 mg total) by mouth 2 (two) times daily.    Dispense:  14 tablet    Refill:  0   Patient discussed with Dr. Sol Blazing.  Return in about 1 week (around 12/25/2023), or if symptoms worsen or fail to improve, for Cough, URI  .  Josclyn Rosales Colbert Coyer, MD

## 2023-12-22 NOTE — Assessment & Plan Note (Signed)
Patient with about a 1 month history of upper airway congestion and cough. Has felt hot but denies fever or headache. Lives with family members, shares room with mother who was recently hospitalized for pneumonia. States cough is worse at night, has noticed some light green/white sputum. Denies chest pain, heart palpitations, SOB, hemoptysis, n/v, abdominal pain, recent travel, dysuria, or rash. More recently has started experiencing some discomfort with swallowing but has been eating and drinking fine. HDS today, BP 114/75, saturating 98% on room air, and oral temperature 95.6 F. Appears to be euvolemic on exam. HENT, CV, and pulmonary exam unremarkable aside from upper respiratory symptoms like rhinorrhea, and upper airway congestion. Has been taking cough drops with minimal improvement. Has avoided Robitussin DM due to adverse effect to DM (agitation), but has also avoided regular Robitussin. Discussed conservative management for what is most likely a viral URI with cough and congestions. Explained strict return precautions and plan, patient and sister agreeable.   Plan - RVP negative for COVID, flu, and RSV - Afrin spray (one spray in each nostril) twice a day for 3 days only  - Flonase spray (one spray in each nostril) daily - Sinus rinse (provided patient handout)  - Tessalon perles one 100 mg capsule up to 3 times a day as needed for cough  - Guaifenesin/Mucinex 600 mg tablet twice a day for 7 days  - Instructed to take temperature at home and write them down if feeling hot, bring in log at next appointment  - Advised to use pulse ox if feeling SOB - Return to clinic in 7 days if minimal improvement or worsening of symptoms to help determine if CXR or further management is needed

## 2023-12-24 NOTE — Addendum Note (Signed)
Addended by: Dickie La on: 12/24/2023 08:09 AM   Modules accepted: Level of Service

## 2023-12-24 NOTE — Progress Notes (Signed)
 Internal Medicine Clinic Attending  Case discussed with the resident at the time of the visit.  We reviewed the resident's history and exam and pertinent patient test results.  I agree with the assessment, diagnosis, and plan of care documented in the resident's note.

## 2023-12-26 ENCOUNTER — Encounter: Payer: 59 | Admitting: Student

## 2023-12-30 ENCOUNTER — Encounter: Payer: Self-pay | Admitting: Internal Medicine

## 2023-12-30 ENCOUNTER — Ambulatory Visit: Payer: 59 | Admitting: Internal Medicine

## 2023-12-30 VITALS — BP 126/76 | HR 86 | Temp 98.0°F | Ht 66.0 in | Wt 211.6 lb

## 2023-12-30 DIAGNOSIS — R0982 Postnasal drip: Secondary | ICD-10-CM

## 2023-12-30 DIAGNOSIS — K5909 Other constipation: Secondary | ICD-10-CM

## 2023-12-30 DIAGNOSIS — K5900 Constipation, unspecified: Secondary | ICD-10-CM

## 2023-12-30 DIAGNOSIS — D500 Iron deficiency anemia secondary to blood loss (chronic): Secondary | ICD-10-CM

## 2023-12-30 DIAGNOSIS — K648 Other hemorrhoids: Secondary | ICD-10-CM

## 2023-12-30 DIAGNOSIS — E119 Type 2 diabetes mellitus without complications: Secondary | ICD-10-CM

## 2023-12-30 MED ORDER — FLUTICASONE PROPIONATE 50 MCG/ACT NA SUSP: 1.0000 | Freq: Every day | NASAL | 2 refills | Status: DC

## 2023-12-30 MED ORDER — SENNA 8.6 MG PO TABS: 1.0000 | ORAL_TABLET | Freq: Every day | ORAL | 0 refills | Status: DC | PRN

## 2023-12-30 NOTE — Patient Instructions (Addendum)
Thank you, Leonard Tucker for allowing [place] to provide your care today.   Post-nasal drip Cough You can take up to 6 weeks for your cough to resolve after virus.  Your throat did look a little bit red but I am not concerned for strep.  This looks more like you are having a lot of sinus congestion that is draining down the back your throat.  Your Flonase and allergy medication should help with this.  Please try technique that we reviewed for your Flonase nasal spray.  Iron deficiency I am rechecking your iron today after you had the iron infusions last year.  I will call your mom about the results.  If this remains abnormal then you should go back to GI.  Diabetes I am rechecking your diabetes lab work.  In the past this has been well-controlled.  I am also checking your urine as diabetes can sometimes affect your kidneys.  I have ordered the following labs for you:   Lab Orders         Iron, TIBC and Ferritin Panel         Microalbumin / Creatinine Urine Ratio         Hemoglobin A1c         CBC no Diff       I have ordered the following medication/changed the following medications:   Stop the following medications: Medications Discontinued During This Encounter  Medication Reason   fluticasone (FLONASE) 50 MCG/ACT nasal spray Reorder     Start the following medications: Meds ordered this encounter  Medications   fluticasone (FLONASE) 50 MCG/ACT nasal spray    Sig: Place 1 spray into both nostrils daily.    Dispense:  9.9 mL    Refill:  2     Follow up: 3 months   We look forward to seeing you next time. Please call our clinic at [phone] if you have any questions or concerns. The best time to call is Monday-Friday from 9am-4pm, but there is someone available 24/7. If after hours or the weekend, call the main hospital number and ask for the Internal Medicine Resident On-Call. If you need medication refills, please notify your pharmacy one week in advance and they will send  [place] a request.   Thank you for trusting me with your care. Wishing you the best!   Rudene Christians, DO [facility]

## 2023-12-30 NOTE — Assessment & Plan Note (Signed)
Iron saturation was 8 in October 2024.  Hemoglobin at 12.5.  Colonoscopy done in 2024 showed no evidence of polyps throughout colon.  He did have grade 3 internal hemorrhoids.  He had iron infusion last fall.  He does not take p.o. iron with history of GI upset. P: Repeat iron studies and CBC.  If continue to be low after iron infusion would need him to go back to GI for capsule endoscopy as we do not have a great explanation for why he has iron deficiency.

## 2023-12-30 NOTE — Assessment & Plan Note (Addendum)
Lab Results  Component Value Date   HGBA1C 6.3 (A) 09/30/2023  Currently diet controlled P: Urine microalbumin/ creatinine ratio A1c Pravastatin 40 mg Consider moving to every 6 A1c if A1c remains less than 7

## 2023-12-30 NOTE — Progress Notes (Signed)
Subjective:  CC: iron deficiency  HPI:  Leonard Tucker is a 68 y.o. male with a past medical history of iron deficiency anemia who presents today for follow-up on iron deficiency, diabetes and recent office visit for cold.   Language interpreter used during duration of visit.  His sister also helped with translating.  Please see problem based assessment and plan for additional details.  Past Medical History:  Diagnosis Date   Abscess of buttock, right 08/26/2022   Chronic constipation    Deaf-mutism    Dental caries    History of iron deficiency anemia    Hypertriglyceridemia    Intellectual disability    Intertrigo    Pneumatosis of intestines 03/19/2022   Prediabetes 07/01/2023   Schizophrenia, paranoid, chronic (HCC)    Umbilical hernia    Upper respiratory infection with cough and congestion 12/22/2023    MEDICATIONS:  Triamterene-hydrochlorothiazide 37.5-25 mg every day cetirizine 10 mg atenolol 25 mg pantoprazole 40 mg  pravastatin 40 mg  Family History  Problem Relation Age of Onset   Hypertension Mother    Diabetes Maternal Aunt    Colon cancer Neg Hx    Rectal cancer Neg Hx    Stomach cancer Neg Hx    Esophageal cancer Neg Hx     Social History   Socioeconomic History   Marital status: Single    Spouse name: Not on file   Number of children: Not on file   Years of education: Not on file   Highest education level: Not on file  Occupational History   Not on file  Tobacco Use   Smoking status: Never   Smokeless tobacco: Never  Substance and Sexual Activity   Alcohol use: No    Alcohol/week: 0.0 standard drinks of alcohol   Drug use: No   Sexual activity: Not on file  Other Topics Concern   Not on file  Social History Narrative   His mother, Leonard Tucker is his primary caretaker and he lives with her.    Communicates via sign language          Social Drivers of Health   Financial Resource Strain: Low Risk  (12/18/2023)   Overall  Financial Resource Strain (CARDIA)    Difficulty of Paying Living Expenses: Not very hard  Food Insecurity: No Food Insecurity (12/18/2023)   Hunger Vital Sign    Worried About Running Out of Food in the Last Year: Never true    Ran Out of Food in the Last Year: Never true  Transportation Needs: No Transportation Needs (12/18/2023)   PRAPARE - Administrator, Civil Service (Medical): No    Lack of Transportation (Non-Medical): No  Physical Activity: Inactive (12/18/2023)   Exercise Vital Sign    Days of Exercise per Week: 0 days    Minutes of Exercise per Session: 0 min  Stress: No Stress Concern Present (12/18/2023)   [employer] of Occupational Health - Occupational Stress Questionnaire    Feeling of Stress : Not at all  Social Connections: Moderately Isolated (12/18/2023)   Social Connection and Isolation Panel [NHANES]    Frequency of Communication with Friends and Family: More than three times a week    Frequency of Social Gatherings with Friends and Family: More than three times a week    Attends Religious Services: 1 to 4 times per year    Active Member of [employer] or Organizations: No    Attends Banker Meetings: Never  Marital Status: Never married  Intimate Partner Violence: Not At Risk (12/18/2023)   Humiliation, Afraid, Rape, and Kick questionnaire    Fear of Current or Ex-Partner: No    Emotionally Abused: No    Physically Abused: No    Sexually Abused: No    Review of Systems: ROS negative except for what is noted on the assessment and plan.  Objective:   Vitals:   12/30/23 1048  BP: 126/76  Pulse: 86  Temp: 98 F (36.7 C)  TempSrc: Oral  SpO2: 98%  Weight: 211 lb 9.6 oz (96 kg)  Height: 5\' 6"  (1.676 m)    Physical Exam: Constitutional: well-appearing HENT: mild pharyngeal erythema, no lymphadenopathy, tonsils do not appear enlarged Cardiovascular: regular rate and rhythm, no m/r/g Pulmonary/Chest: normal work of breathing on  room air, lungs clear to auscultation bilaterally MSK: normal bulk and tone Neurological: alert & oriented x 3,normal gait Skin: warm and dry   Assessment & Plan:  Iron deficiency anemia due to chronic blood loss Iron saturation was 8 in October 2024.  Hemoglobin at 12.5.  Colonoscopy done in 2024 showed no evidence of polyps throughout colon.  He did have grade 3 internal hemorrhoids.  He had iron infusion last fall.  He does not take p.o. iron with history of GI upset. P: Repeat iron studies and CBC.  If continue to be low after iron infusion would need him to go back to GI for capsule endoscopy as we do not have a great explanation for why he has iron deficiency.  Internal hemorrhoids History grade 3 internal hemorrhoids seen on colonoscopy in [place] 2024.  He continues to have chronic constipation.  I talked with sister about taking MiraLAX which they have at home and she will start that daily.  We also talked about trying senna if he continues to have constipation.  Diabetes (HCC) Lab Results  Component Value Date   HGBA1C 6.3 (A) 09/30/2023  Currently diet controlled P: Urine microalbumin/ creatinine ratio A1c Pravastatin 40 mg Consider moving to every 6 A1c if A1c remains less than 7   Patient discussed with Dr. Hurshel Keys Tallon Gertz, D.O. [facility]  PGY-3 Pager: [phone]  Phone: [phone] Date 12/30/2023  Time 5:14 PM

## 2023-12-30 NOTE — Assessment & Plan Note (Signed)
History grade 3 internal hemorrhoids seen on colonoscopy in [facility] 2024.  He continues to have chronic constipation.  I talked with sister about taking MiraLAX which they have at home and she will start that daily.  We also talked about trying senna if he continues to have constipation.

## 2023-12-31 LAB — CBC
Hematocrit: 40.8 % (ref 37.5–51.0)
Hemoglobin: 12.5 g/dL — ABNORMAL LOW (ref 13.0–17.7)
MCH: 23.6 pg — ABNORMAL LOW (ref 26.6–33.0)
MCHC: 30.6 g/dL — ABNORMAL LOW (ref 31.5–35.7)
MCV: 77 fL — ABNORMAL LOW (ref 79–97)
Platelets: 218 10*3/uL (ref 150–450)
RBC: 5.29 x10E6/uL (ref 4.14–5.80)
RDW: 17.7 % — ABNORMAL HIGH (ref 11.6–15.4)
WBC: 7.4 10*3/uL (ref 3.4–10.8)

## 2023-12-31 LAB — IRON,TIBC AND FERRITIN PANEL
Ferritin: 410 ng/mL — ABNORMAL HIGH (ref 30–400)
Iron Saturation: 13 % — ABNORMAL LOW (ref 15–55)
Iron: 37 ug/dL — ABNORMAL LOW (ref 38–169)
Total Iron Binding Capacity: 293 ug/dL (ref 250–450)
UIBC: 256 ug/dL (ref 111–343)

## 2023-12-31 LAB — HEMOGLOBIN A1C
Est. average glucose Bld gHb Est-mCnc: 143 mg/dL
Hgb A1c MFr Bld: 6.6 % — ABNORMAL HIGH (ref 4.8–5.6)

## 2024-01-02 LAB — MICROALBUMIN / CREATININE URINE RATIO
Creatinine, Urine: 160.7 mg/dL
Microalb/Creat Ratio: 34 mg/g{creat} — ABNORMAL HIGH (ref 0–29)
Microalbumin, Urine: 55.4 ug/mL

## 2024-01-05 NOTE — Progress Notes (Signed)
 Internal Medicine Clinic Attending  Case discussed with the resident at the time of the visit.  We reviewed the resident's history and exam and pertinent patient test results.  I agree with the assessment, diagnosis, and plan of care documented in the resident's note.

## 2024-01-05 NOTE — Progress Notes (Signed)
See attestation from 1/28 clinic visit

## 2024-01-07 ENCOUNTER — Telehealth: Payer: Self-pay | Admitting: Internal Medicine

## 2024-01-07 NOTE — Telephone Encounter (Signed)
 Dr. Legrand,   I wanted to check in about this shared patient.  He has history of iron  deficiency anemia and he had a colonoscopy and EGD done in April 2024.  This showed grade 3 internal hemorrhoids but no polyps and EGD without other explanation for anemia.   His anemia has greatly improved over the last months.  Hemoglobin at 12.5 more recently.  Rechecked his iron  panel and iron  sats remain low at 13 after an iron  infusion that should have met his deficits for iron .   Do you think that he should have a capsule endoscopy?

## 2024-01-07 NOTE — Telephone Encounter (Signed)
 Thank you for the note.  After completing his EGD and colonoscopy in April 224,and due to his history of high grade SBO in April 2023, I felt a CT enterography was needed prior to proceeding with a capsule study.  We needed to rule out a stricture of some kind that might lead to capsule retention.  It was scheduled, but then he (or his family) canceled it and did not reschedule.  So we were unable to proceed with the capsule study.   If they reconsider that and CTE is done, then perhaps that plan can change.  - H. Legrand, MD

## 2024-01-13 ENCOUNTER — Encounter: Payer: 59 | Admitting: Student

## 2024-01-20 ENCOUNTER — Encounter: Payer: 59 | Admitting: Student

## 2024-01-22 ENCOUNTER — Other Ambulatory Visit: Payer: Self-pay | Admitting: Student

## 2024-01-23 ENCOUNTER — Ambulatory Visit (HOSPITAL_COMMUNITY)
Admission: RE | Admit: 2024-01-23 | Discharge: 2024-01-23 | Disposition: A | Discharge status: Home / Self Care | Payer: 59 | Source: Ambulatory Visit | Attending: Internal Medicine | Admitting: Internal Medicine

## 2024-01-23 DIAGNOSIS — Z8719 Personal history of other diseases of the digestive system: Secondary | ICD-10-CM | POA: Diagnosis not present

## 2024-01-23 DIAGNOSIS — K429 Umbilical hernia without obstruction or gangrene: Secondary | ICD-10-CM | POA: Diagnosis not present

## 2024-01-23 DIAGNOSIS — K439 Ventral hernia without obstruction or gangrene: Secondary | ICD-10-CM | POA: Insufficient documentation

## 2024-01-23 DIAGNOSIS — D500 Iron deficiency anemia secondary to blood loss (chronic): Secondary | ICD-10-CM | POA: Diagnosis not present

## 2024-01-23 DIAGNOSIS — K5730 Diverticulosis of large intestine without perforation or abscess without bleeding: Secondary | ICD-10-CM | POA: Diagnosis not present

## 2024-01-23 DIAGNOSIS — N281 Cyst of kidney, acquired: Secondary | ICD-10-CM | POA: Diagnosis not present

## 2024-01-23 MED ORDER — IOHEXOL 350 MG/ML SOLN
75.0000 mL | Freq: Once | INTRAVENOUS | Status: AC | PRN
  Administered 2024-01-23: 75 mL via INTRAVENOUS

## 2024-02-27 ENCOUNTER — Other Ambulatory Visit: Payer: Self-pay | Admitting: Student

## 2024-02-27 DIAGNOSIS — I10 Essential (primary) hypertension: Secondary | ICD-10-CM

## 2024-03-01 ENCOUNTER — Other Ambulatory Visit: Payer: Self-pay | Admitting: Student

## 2024-03-01 DIAGNOSIS — R0982 Postnasal drip: Secondary | ICD-10-CM

## 2024-03-01 NOTE — Telephone Encounter (Signed)
 Medication sent to pharmacy

## 2024-03-02 ENCOUNTER — Other Ambulatory Visit: Payer: Self-pay

## 2024-03-02 ENCOUNTER — Ambulatory Visit (INDEPENDENT_AMBULATORY_CARE_PROVIDER_SITE_OTHER): Payer: 59 | Admitting: Student

## 2024-03-02 VITALS — BP 113/53 | HR 68 | Temp 97.5°F | Ht 66.0 in | Wt 209.8 lb

## 2024-03-02 DIAGNOSIS — D179 Benign lipomatous neoplasm, unspecified: Secondary | ICD-10-CM | POA: Insufficient documentation

## 2024-03-02 DIAGNOSIS — R0982 Postnasal drip: Secondary | ICD-10-CM

## 2024-03-02 DIAGNOSIS — E785 Hyperlipidemia, unspecified: Secondary | ICD-10-CM

## 2024-03-02 DIAGNOSIS — E119 Type 2 diabetes mellitus without complications: Secondary | ICD-10-CM

## 2024-03-02 DIAGNOSIS — D1779 Benign lipomatous neoplasm of other sites: Secondary | ICD-10-CM

## 2024-03-02 DIAGNOSIS — I10 Essential (primary) hypertension: Secondary | ICD-10-CM

## 2024-03-02 DIAGNOSIS — D500 Iron deficiency anemia secondary to blood loss (chronic): Secondary | ICD-10-CM | POA: Diagnosis not present

## 2024-03-02 DIAGNOSIS — J302 Other seasonal allergic rhinitis: Secondary | ICD-10-CM

## 2024-03-02 MED ORDER — CETIRIZINE HCL 10 MG PO TABS: 10.0000 mg | ORAL_TABLET | Freq: Every day | ORAL | 2 refills | Status: DC

## 2024-03-02 MED ORDER — PRAVASTATIN SODIUM 40 MG PO TABS: 40.0000 mg | ORAL_TABLET | Freq: Every day | ORAL | 3 refills | Status: DC

## 2024-03-02 MED ORDER — FLUTICASONE PROPIONATE 50 MCG/ACT NA SUSP: 1.0000 | Freq: Every day | NASAL | 2 refills | Status: DC

## 2024-03-02 NOTE — Assessment & Plan Note (Signed)
 Blood pressure on the softer side today, 106/58, repeat 113/53.  Patient denies any lightheadedness, dizziness, weakness, or syncope.  Current regimen includes atenolol 25 mg daily and Dyazide 37.5-25 daily.  Continued attention to blood pressure at future visits recommended.

## 2024-03-02 NOTE — Assessment & Plan Note (Signed)
 Relatively large subcutaneous lesion of the back appreciated today.  Soft, mobile, most consistent with lipoma.  No pain, erythema, recent enlargement, or overlying skin changes.

## 2024-03-02 NOTE — Assessment & Plan Note (Addendum)
 A1c as high as 6.8 in the past, diet controlled at this time.  Patient's family very upset that they had not been told about his diagnosis.  I do see documentation in the chart regarding his diabetes, with patient instructions at last visit discussing that they had repeat his A1c to follow-up on his well-controlled diabetes. It is unclear where the miscue regarding the patient's diabetes may have taken place.   Sadly this was discussed at the tail end of the encounter today insufficient time to fully address their diabetes. They would be interested in talking about weight loss and nutritional options, I have asked him to return for dedicated diabetes visit so we may formally sit down and go through everything.  At that time, referral to Lupita Leash and potential initiation of GLP-1 medication may be beneficial.

## 2024-03-02 NOTE — Patient Instructions (Addendum)
 Thank you, [name] for allowing [place] to provide your care today.  I have ordered the following tests for you:   Lab Orders         CBC no Diff      I have ordered the following medication/changed the following medications:   Start the following medications: Meds ordered this encounter  Medications   cetirizine (ZYRTEC ALLERGY) 10 MG tablet    Sig: Take 1 tablet (10 mg total) by mouth daily.    Dispense:  90 tablet    Refill:  2   pravastatin (PRAVACHOL) 40 MG tablet    Sig: Take 1 tablet (40 mg total) by mouth daily.    Dispense:  90 tablet    Refill:  3   fluticasone (FLONASE) 50 MCG/ACT nasal spray    Sig: Place 1 spray into both nostrils daily.    Dispense:  48 mL    Refill:  2      Follow up:  2 weeks  for diabetes  Please remember:  Call Dr. Sherren Mocha, MD Address: [address], [place], [place] [address] Phone: [phone]   We look forward to seeing you next time. Please call our clinic at [phone] if you have any questions or concerns. The best time to call is Monday-Friday from 9am-4pm, but there is someone available 24/7. If after hours or the weekend, call the main hospital number and ask for the Internal Medicine Resident On-Call. If you need medication refills, please notify your pharmacy one week in advance and they will send [place] a request.   Thank you for trusting me with your care. Wishing you the best!  Lovie Macadamia MD [facility]

## 2024-03-02 NOTE — Progress Notes (Signed)
 Subjective:  CC: Routine follow-up  HPI:  [name] is a 68 y.o. person with a past medical history stated below and presents today for the stated chief complaint. Please see problem based assessment and plan for additional details.  Of note, the patient requires interpreter as he is deaf.  He speaks some sign language, but is not completely fluent in ASL.  His family interprets for him through much of the visit.  Past Medical History:  Diagnosis Date   Abscess of buttock, right 08/26/2022   Chronic constipation    Deaf-mutism    Dental caries    History of iron deficiency anemia    Hypertriglyceridemia    Intellectual disability    Intertrigo    Pneumatosis of intestines 03/19/2022   Prediabetes 07/01/2023   Schizophrenia, paranoid, chronic (HCC)    Umbilical hernia    Upper respiratory infection with cough and congestion 12/22/2023    Current Outpatient Medications on File Prior to Visit  Medication Sig Dispense Refill   atenolol (TENORMIN) 25 MG tablet Take 1 tablet (25 mg total) by mouth daily. 90 tablet 3   guaiFENesin (MUCINEX) 600 MG 12 hr tablet Take 1 tablet (600 mg total) by mouth 2 (two) times daily. 14 tablet 0   pantoprazole (PROTONIX) 40 MG tablet TAKE 1 TABLET BY MOUTH TWICE A DAY 180 tablet 0   phenylephrine-shark liver oil-mineral oil-petrolatum (PREPARATION H) 0.25-3-14-71.9 % rectal ointment Place 1 Application rectally 2 (two) times daily as needed for hemorrhoids. 30 g 0   senna (SENOKOT) 8.6 MG TABS tablet Take 1 tablet (8.6 mg total) by mouth daily as needed for mild constipation. 120 tablet 0   triamterene-hydrochlorothiazide (DYAZIDE) 37.5-25 MG capsule TAKE 1 EACH (1 CAPSULE TOTAL) BY MOUTH DAILY. 90 capsule 3   No current facility-administered medications on file prior to visit.    Review of Systems: Please see assessment and plan for pertinent positives and negatives.  Objective:   Vitals:   03/02/24 1011 03/02/24 1112  BP: (!)  106/58 (!) 113/53  Pulse: 65 68  Temp: (!) 97.5 F (36.4 C)   TempSrc: Oral   SpO2: 99%   Weight: 209 lb 12.8 oz (95.2 kg)   Height: 5\' 6"  (1.676 m)     Physical Exam: Constitutional: Well-appearing Cardiovascular: Regular rate and rhythm Pulmonary/Chest: lungs clear to auscultation bilaterally Abdominal: soft, non-tender, non-distended Extremities: No edema of the lower extremities bilaterally Psych: Pleasant affect Thought process is linear and is goal-directed.     Assessment & Plan:  Iron deficiency anemia due to chronic blood loss History of microcytic anemia, presumed to be secondary to GI losses.  Follows with Dr. Myrtie Neither, capsule endoscopy had been tentatively planned.  Patient was informed to schedule to see them last visit, but had been unable to follow-up with them.  At this appointment, patient was again given instruction to follow-up with Dr. Myrtie Neither and phone number for his office was provided.  Patient sister states that she will follow-up with him regarding this.  He has received iron infusions in the past. Last iron panel on 12/30/2023 demonstrated increased ferritin, slightly low iron, decreased iron saturation.  May be secondary to recent iron infusion or represent anemia of chronic disease. Most recent hemoglobin slightly low at 12.5, microcytic with increased RDW.  Will recheck CBC today. Plan: Repeat CBC today. Patient to call Dr. Myrtie Neither for follow-up   Seasonal allergies Endorses recent presumed viral conjunctivitis with gritty sensation and redness of the eyes.  They state that this went around their household, and had a granddaughter with pinkeye.  This is largely resolved, no significant erythema, injection, or discharge of the eyes on exam today.  He does take Zyrtec and Flonase as needed for seasonal allergies, and endorses some itchiness of the eyes today.  Will continue these medications.  Diabetes (HCC) A1c as high as 6.8 in the past, diet controlled at this  time.  Patient's family very upset that they had not been told about his diagnosis.  I do see documentation in the chart regarding his diabetes, with patient instructions at last visit discussing that they had repeat his A1c to follow-up on his well-controlled diabetes. It is unclear where the miscue regarding the patient's diabetes may have taken place.   Sadly this was discussed at the tail end of the encounter today insufficient time to fully address their diabetes. They would be interested in talking about weight loss and nutritional options, I have asked him to return for dedicated diabetes visit so we may formally sit down and go through everything.  At that time, referral to Lupita Leash and potential initiation of GLP-1 medication may be beneficial.   Lipoma Relatively large subcutaneous lesion of the back appreciated today.  Soft, mobile, most consistent with lipoma.  No pain, erythema, recent enlargement, or overlying skin changes.  Essential hypertension Blood pressure on the softer side today, 106/58, repeat 113/53.  Patient denies any lightheadedness, dizziness, weakness, or syncope.  Current regimen includes atenolol 25 mg daily and Dyazide 37.5-25 daily.  Continued attention to blood pressure at future visits recommended.    Patient discussed with Dr. Elliot Cousin MD [facility]  PGY-1 Pager: [phone]  Phone: [phone] Date 03/02/2024  Time 2:43 PM

## 2024-03-02 NOTE — Assessment & Plan Note (Signed)
 Endorses recent presumed viral conjunctivitis with gritty sensation and redness of the eyes.  They state that this went around their household, and had a granddaughter with pinkeye.  This is largely resolved, no significant erythema, injection, or discharge of the eyes on exam today.  He does take Zyrtec and Flonase as needed for seasonal allergies, and endorses some itchiness of the eyes today.  Will continue these medications.

## 2024-03-02 NOTE — Assessment & Plan Note (Addendum)
 History of microcytic anemia, presumed to be secondary to GI losses.  Follows with Dr. Myrtie Neither, capsule endoscopy had been tentatively planned.  Patient was informed to schedule to see them last visit, but had been unable to follow-up with them.  At this appointment, patient was again given instruction to follow-up with Dr. Myrtie Neither and phone number for his office was provided.  Patient sister states that she will follow-up with him regarding this.  He has received iron infusions in the past. Last iron panel on 12/30/2023 demonstrated increased ferritin, slightly low iron, decreased iron saturation.  May be secondary to recent iron infusion or represent anemia of chronic disease. Most recent hemoglobin slightly low at 12.5, microcytic with increased RDW.  Will recheck CBC today. Plan: Repeat CBC today. Patient to call Dr. Myrtie Neither for follow-up

## 2024-03-03 LAB — CBC
Hematocrit: 39.3 % (ref 37.5–51.0)
Hemoglobin: 12.3 g/dL — ABNORMAL LOW (ref 13.0–17.7)
MCH: 24.1 pg — ABNORMAL LOW (ref 26.6–33.0)
MCHC: 31.3 g/dL — ABNORMAL LOW (ref 31.5–35.7)
MCV: 77 fL — ABNORMAL LOW (ref 79–97)
Platelets: 278 10*3/uL (ref 150–450)
RBC: 5.11 x10E6/uL (ref 4.14–5.80)
RDW: 14.6 % (ref 11.6–15.4)
WBC: 8 10*3/uL (ref 3.4–10.8)

## 2024-03-04 NOTE — Progress Notes (Signed)
 Internal Medicine Clinic Attending  Case discussed with the resident at the time of the visit.  We reviewed the resident's history and exam and pertinent patient test results.  I agree with the assessment, diagnosis, and plan of care documented in the resident's note.

## 2024-03-18 ENCOUNTER — Encounter: Admitting: Internal Medicine

## 2024-03-18 ENCOUNTER — Ambulatory Visit: Admitting: Internal Medicine

## 2024-03-18 ENCOUNTER — Telehealth: Payer: Self-pay

## 2024-03-18 VITALS — BP 130/92 | HR 83 | Temp 98.2°F | Ht 66.0 in | Wt 210.0 lb

## 2024-03-18 DIAGNOSIS — E119 Type 2 diabetes mellitus without complications: Secondary | ICD-10-CM

## 2024-03-18 DIAGNOSIS — Z7985 Long-term (current) use of injectable non-insulin antidiabetic drugs: Secondary | ICD-10-CM | POA: Diagnosis not present

## 2024-03-18 DIAGNOSIS — Z23 Encounter for immunization: Secondary | ICD-10-CM

## 2024-03-18 LAB — POCT GLYCOSYLATED HEMOGLOBIN (HGB A1C): Hemoglobin A1C: 6.5 % — AB (ref 4.0–5.6)

## 2024-03-18 LAB — GLUCOSE, CAPILLARY: Glucose-Capillary: 105 mg/dL — ABNORMAL HIGH (ref 70–99)

## 2024-03-18 MED ORDER — SEMAGLUTIDE 3 MG PO TABS: 3.0000 mg | ORAL_TABLET | Freq: Every day | ORAL | 0 refills | Status: DC

## 2024-03-18 NOTE — Progress Notes (Deleted)
 DM HbA1c last checked 12/2023 6.6%, today ***. He is not on medication for this, rather his disease has been diet controlled. Urine microalbumin/creatinine ratio last checked 12/2023 moderately elevated at 34  Last eye exam ***. Renal function last checked 06/2023 WNL. Plan: Referral placed to registered dietician. Will start semaglutide 0.25 mg injection once weekly. F/u in 1 month to see how he is tolerating this and to increase dose if indicated. Emphasized importance of annual diabetic eye exam.    Pancreatitis history? Open to weekly injections?

## 2024-03-18 NOTE — Telephone Encounter (Signed)
 Leonard Tucker (Key: [id number]) Rx #: [id number] Rybelsus 3MG  tablets Form [employer] Medicare Part D Electronic Prior Authorization Form (2017 NCPDP) Created Sent to Plan Plan Response Submit Clinical Questions Determination Favorable Message from Plan Request Reference Number: [id number]. RYBELSUS TAB 3MG  is approved through 12/01/2024. Your patient may now fill this prescription and it will be covered.. Authorization Expiration Date: December 01, 2024.

## 2024-03-18 NOTE — Progress Notes (Signed)
   CC: discussion re: diabetes diagnosis  HPI:  [name] is a 68 y.o. male who presents with his family to discuss his diagnosis of diabetes and further management of the disease. Please see problem based charting for detailed assessment and plan.  Past Medical History:  Diagnosis Date   Abscess of buttock, right 08/26/2022   Chronic constipation    Deaf-mutism    Dental caries    History of iron  deficiency anemia    Hx SBO 01/01/2023   Hypertriglyceridemia    Intellectual disability    Intertrigo    Pneumatosis of intestines 03/19/2022   Prediabetes 07/01/2023   Schizophrenia, paranoid, chronic (HCC)    Umbilical hernia    Upper respiratory infection with cough and congestion 12/22/2023   Review of Systems:  Negative unless otherwise stated.  Physical Exam:  Vitals:   03/18/24 1348  BP: (!) 130/92  Pulse: 83  Temp: 98.2 F (36.8 C)  TempSrc: Oral  SpO2: 99%  Weight: 210 lb (95.3 kg)  Height: 5\' 6"  (1.676 m)   Constitutional:Appears stated age, well cared for. Communicating via sign language with video interpreter and family. In no acute distress. Pulm:Normal work of breathing on room air. ZOX:WRUEAVWU for extremity edema. Skin:Warm and dry. Neuro:Alert and oriented x3. No focal deficit noted. Psych:Pleasant mood and affect.  Assessment & Plan:   See Encounters Tab for problem based charting.  Encounter for immunization Pneumonia vaccine given today.  Diabetes (HCC) HbA1c last checked 12/2023 6.6%, today 6.5%. No outpatient pharmacologic therapy as this has been diet controlled previously.  Urine microalbumin/creatinine ratio last checked 12/2023 moderately increased to 34. Last eye exam was within the last year and he follows with Leonard Tucker. Renal function WNL when checked last 06/2023.  In discussion with Leonard Tucker family, they are unable to give a once weekly injection to him and prefer a oral medication that could help with diabetes and his  weight. He has no history of pancreatitis or UTI that they are aware of. Leonard Tucker is agreeable to starting medication to help with his diabetes.  Plan: BMP at next OV has he did not stop on the way to have this done. Start semaglutide  3 mg PO daily with follow up in 4 weeks for further titration if tolerated. Counseled patient and his family on importance of monitoring feet for sores or wounds, and I have provided information with AVS regarding diabetes and foot care, exercise, and nutrition. Encouraged Leonard Tucker to start wearing socks. Emphasized importance of annual diabetic eye exam.   Patient discussed with Leonard Tucker

## 2024-03-18 NOTE — Telephone Encounter (Signed)
 Prior Authorization for patient (Rybelsus 3MG  tablets) came through on cover my meds was submitted with last office notes and labs awaiting approval or denial.  UJW:[id number]

## 2024-03-18 NOTE — Patient Instructions (Addendum)
 Leonard Tucker,  It was a pleasure to care for you today!  I have started you on a medicine for your diabetes called semaglutide. You will take one tablet daily. I would like you to follow up in 1 month.  I have included information about caring for your overall health with diabetes.  My best, Dr. Rozelle Corning

## 2024-03-19 ENCOUNTER — Encounter: Payer: Self-pay | Admitting: Internal Medicine

## 2024-03-19 DIAGNOSIS — Z23 Encounter for immunization: Secondary | ICD-10-CM | POA: Insufficient documentation

## 2024-03-19 NOTE — Assessment & Plan Note (Signed)
Pneumonia vaccine given today.  

## 2024-03-19 NOTE — Assessment & Plan Note (Signed)
 HbA1c last checked 12/2023 6.6%, today 6.5%. No outpatient pharmacologic therapy as this has been diet controlled previously.  Urine microalbumin/creatinine ratio last checked 12/2023 moderately increased to 34. Last eye exam was within the last year and he follows with Dr. Candi Chafe. Renal function WNL when checked last 06/2023.  In discussion with Leonard Tucker family, they are unable to give a once weekly injection to him and prefer a oral medication that could help with diabetes and his weight. He has no history of pancreatitis or UTI that they are aware of. Leonard Tucker is agreeable to starting medication to help with his diabetes.  Plan: BMP at next OV has he did not stop on the way to have this done. Start semaglutide  3 mg PO daily with follow up in 4 weeks for further titration if tolerated. Counseled patient and his family on importance of monitoring feet for sores or wounds, and I have provided information with AVS regarding diabetes and foot care, exercise, and nutrition. Encouraged Leonard Tucker to start wearing socks. Emphasized importance of annual diabetic eye exam.

## 2024-03-23 ENCOUNTER — Ambulatory Visit: Payer: Self-pay | Admitting: Student

## 2024-03-24 ENCOUNTER — Ambulatory Visit: Admitting: Student

## 2024-03-24 VITALS — BP 129/84 | HR 86 | Temp 98.2°F | Wt 206.8 lb

## 2024-03-24 DIAGNOSIS — Z7985 Long-term (current) use of injectable non-insulin antidiabetic drugs: Secondary | ICD-10-CM

## 2024-03-24 DIAGNOSIS — E119 Type 2 diabetes mellitus without complications: Secondary | ICD-10-CM | POA: Diagnosis not present

## 2024-03-24 DIAGNOSIS — J069 Acute upper respiratory infection, unspecified: Secondary | ICD-10-CM

## 2024-03-24 MED ORDER — GUAIFENESIN ER 600 MG PO TB12: 600.0000 mg | ORAL_TABLET | Freq: Two times a day (BID) | ORAL | 0 refills | Status: DC

## 2024-03-24 NOTE — Patient Instructions (Addendum)
 Thank you, [name] for allowing us  to provide your care today. Today we discussed   -I am glad your symptoms of cough and sore throat are getting better. -Blood pressure good today and no fever today, oxygen level is normal.  -Mucinex  600 mg twice a day for mucus congestion/cough   -Call the office if you have high fever or worsening symptoms   -Start taking the semaglutide  (for diabetes) that you just picked up  -Here is the contact info for your GI doctor:  [facility] [address] [place], [place] [address] [phone]   I have ordered the following medication/changed the following medications:  Start the following medications: Meds ordered this encounter  Medications   guaiFENesin  (MUCINEX ) 600 MG 12 hr tablet    Sig: Take 1 tablet (600 mg total) by mouth 2 (two) times daily.    Dispense:  28 tablet    Refill:  0     Follow up:  Next appt on 5/21    Should you have any questions or concerns please call the internal medicine clinic at [phone].    Jamaria Amborn, D.O. [facility]

## 2024-03-24 NOTE — Progress Notes (Unsigned)
 CC: Acute visit  HPI:  [name] Tucker is a 68 y.o. male living with a history stated below and presents today for acute visit. Please see problem based assessment and plan for additional details.  In-person sign language interpreter present during encounter.   Past Medical History:  Diagnosis Date   Abscess of buttock, right 08/26/2022   Chronic constipation    Deaf-mutism    Dental caries    History of iron  deficiency anemia    Hx SBO 01/01/2023   Hypertriglyceridemia    Intellectual disability    Intertrigo    Pneumatosis of intestines 03/19/2022   Prediabetes 07/01/2023   Schizophrenia, paranoid, chronic (HCC)    Umbilical hernia    Upper respiratory infection with cough and congestion 12/22/2023    Current Outpatient Medications on File Prior to Visit  Medication Sig Dispense Refill   atenolol  (TENORMIN ) 25 MG tablet Take 1 tablet (25 mg total) by mouth daily. 90 tablet 3   cetirizine  (ZYRTEC  ALLERGY) 10 MG tablet Take 1 tablet (10 mg total) by mouth daily. 90 tablet 2   fluticasone  (FLONASE ) 50 MCG/ACT nasal spray Place 1 spray into both nostrils daily. 48 mL 2   guaiFENesin  (MUCINEX ) 600 MG 12 hr tablet Take 1 tablet (600 mg total) by mouth 2 (two) times daily. 14 tablet 0   pantoprazole  (PROTONIX ) 40 MG tablet TAKE 1 TABLET BY MOUTH TWICE A DAY 180 tablet 0   phenylephrine-shark liver oil-mineral oil-petrolatum (PREPARATION H) 0.25-3-14-71.9 % rectal ointment Place 1 Application rectally 2 (two) times daily as needed for hemorrhoids. 30 g 0   pravastatin  (PRAVACHOL ) 40 MG tablet Take 1 tablet (40 mg total) by mouth daily. 90 tablet 3   Semaglutide  3 MG TABS Take 1 tablet (3 mg total) by mouth daily in the afternoon. 30 tablet 0   senna (SENOKOT) 8.6 MG TABS tablet Take 1 tablet (8.6 mg total) by mouth daily as needed for mild constipation. 120 tablet 0   triamterene -hydrochlorothiazide  (DYAZIDE ) 37.5-25 MG capsule TAKE 1 EACH (1 CAPSULE TOTAL) BY MOUTH DAILY. 90  capsule 3   No current facility-administered medications on file prior to visit.    Family History  Problem Relation Age of Onset   Hypertension Mother    Diabetes Maternal Aunt    Colon cancer Neg Hx    Rectal cancer Neg Hx    Stomach cancer Neg Hx    Esophageal cancer Neg Hx     Social History   Socioeconomic History   Marital status: Single    Spouse name: Not on file   Number of children: Not on file   Years of education: Not on file   Highest education level: Not on file  Occupational History   Not on file  Tobacco Use   Smoking status: Never   Smokeless tobacco: Never  Substance and Sexual Activity   Alcohol  use: No    Alcohol /week: 0.0 standard drinks of alcohol    Drug use: No   Sexual activity: Not on file  Other Topics Concern   Not on file  Social History Narrative   His mother, Leonard Tucker Tucker is his primary caretaker and he lives with her.    Communicates via sign language          Social Drivers of Health   Financial Resource Strain: Low Risk  (12/18/2023)   Overall Financial Resource Strain (CARDIA)    Difficulty of Paying Living Expenses: Not very hard  Food Insecurity: No Food Insecurity (12/18/2023)  Hunger Vital Sign    Worried About Running Out of Food in the Last Year: Never true    Ran Out of Food in the Last Year: Never true  Transportation Needs: No Transportation Needs (12/18/2023)   PRAPARE - Administrator, Civil Service (Medical): No    Lack of Transportation (Non-Medical): No  Physical Activity: Inactive (12/18/2023)   Exercise Vital Sign    Days of Exercise per Week: 0 days    Minutes of Exercise per Session: 0 min  Stress: No Stress Concern Present (12/18/2023)   [employer] of Occupational Health - Occupational Stress Questionnaire    Feeling of Stress : Not at all  Social Connections: Moderately Isolated (12/18/2023)   Social Connection and Isolation Panel [NHANES]    Frequency of Communication with Friends and  Family: More than three times a week    Frequency of Social Gatherings with Friends and Family: More than three times a week    Attends Religious Services: 1 to 4 times per year    Active Member of [employer] or Organizations: No    Attends Banker Meetings: Never    Marital Status: Never married  Intimate Partner Violence: Not At Risk (12/18/2023)   Humiliation, Afraid, Rape, and Kick questionnaire    Fear of Current or Ex-Partner: No    Emotionally Abused: No    Physically Abused: No    Sexually Abused: No    Review of Systems: ROS negative except for what is noted on the assessment and plan.  Vitals:   03/24/24 1518  BP: 129/84  Pulse: 86  Temp: 98.2 F (36.8 C)  TempSrc: Oral  SpO2: 98%  Weight: 206 lb 12.8 oz (93.8 kg)   Physical Exam: Constitutional: alert, male sitting in chair, in no acute distress HENT: mucous membranes moist, no significant erythema or exudate of oropharynx  Cardiovascular: regular rate and rhythm Pulmonary/Chest: normal work of breathing on room air, lungs clear to auscultation bilaterally Neurological: alert   Assessment & Plan:   Viral URI with cough Reports nasal congestion, sore throat and productive cough for 3-4 days. Denies fever, chills, n/v/d. No chest or abdominal pain or SOB. Reports sick contacts at home. Reports symptoms have slightly improved today and not worsening. Stable vitals, afebrile. Exam without acute findings. Suspect viral etiology.   Plan -Mucinex  600 mg BID -Tylenol  PRN, discussed nasal spray if needed  -Has oral antihistamine (Claritin , Allegra, Zyrtec ) for seasonal allergies as well  -Monitor symptoms, return precautions discussed with patient and family   Diabetes (HCC) Family just picked up oral semaglutide , not yet started. Plan to start taking tomorrow. Has f/u visit scheduled for 5/21 already.   Plan -Start oral semaglutide  3 mg daily  -BMP at next visit for annual eGFR -Has f/u visit in ~1  month   Patient discussed with Dr. Nelson Bandy, D.O. [facility], PGY-2 Phone: [phone] Date 03/24/2024 Time 3:24 PM

## 2024-03-24 NOTE — Progress Notes (Signed)
 Internal Medicine Clinic Attending  Case discussed with the resident at the time of the visit.  We reviewed the resident's history and exam and pertinent patient test results.  I agree with the assessment, diagnosis, and plan of care documented in the resident's note.

## 2024-03-25 ENCOUNTER — Encounter: Payer: Self-pay | Admitting: Student

## 2024-03-25 NOTE — Assessment & Plan Note (Addendum)
 Reports nasal congestion, sore throat and productive cough for 3-4 days. Denies fever, chills, n/v/d. No chest or abdominal pain or SOB. Reports sick contacts at home. Reports symptoms have slightly improved today and not worsening. Stable vitals, afebrile. Exam without acute findings. Suspect viral etiology.   Plan -Mucinex  600 mg BID -Tylenol  PRN, discussed nasal spray if needed  -Has oral antihistamine (Claritin , Allegra, Zyrtec ) for seasonal allergies as well  -Monitor symptoms, return precautions discussed with patient and family

## 2024-03-25 NOTE — Assessment & Plan Note (Signed)
 Family just picked up oral semaglutide , not yet started. Plan to start taking tomorrow. Has f/u visit scheduled for 5/21 already.   Plan -Start oral semaglutide  3 mg daily  -BMP at next visit for annual eGFR -Has f/u visit in ~1 month

## 2024-03-29 ENCOUNTER — Encounter: Payer: 59 | Admitting: Internal Medicine

## 2024-04-02 NOTE — Progress Notes (Signed)
 Internal Medicine Clinic Attending  Case discussed with the resident at the time of the visit.  We reviewed the resident's history and exam and pertinent patient test results.  I agree with the assessment, diagnosis, and plan of care documented in the resident's note.

## 2024-04-21 ENCOUNTER — Encounter: Admitting: Student

## 2024-04-30 ENCOUNTER — Encounter: Payer: Self-pay | Admitting: Internal Medicine

## 2024-04-30 ENCOUNTER — Ambulatory Visit (INDEPENDENT_AMBULATORY_CARE_PROVIDER_SITE_OTHER): Admitting: Internal Medicine

## 2024-04-30 VITALS — BP 119/76 | HR 67 | Temp 98.0°F | Ht 66.0 in | Wt 203.4 lb

## 2024-04-30 DIAGNOSIS — Z7985 Long-term (current) use of injectable non-insulin antidiabetic drugs: Secondary | ICD-10-CM

## 2024-04-30 DIAGNOSIS — D1779 Benign lipomatous neoplasm of other sites: Secondary | ICD-10-CM | POA: Diagnosis not present

## 2024-04-30 DIAGNOSIS — D649 Anemia, unspecified: Secondary | ICD-10-CM

## 2024-04-30 DIAGNOSIS — E1169 Type 2 diabetes mellitus with other specified complication: Secondary | ICD-10-CM | POA: Diagnosis not present

## 2024-04-30 DIAGNOSIS — I10 Essential (primary) hypertension: Secondary | ICD-10-CM | POA: Diagnosis not present

## 2024-04-30 DIAGNOSIS — D509 Iron deficiency anemia, unspecified: Secondary | ICD-10-CM | POA: Diagnosis not present

## 2024-04-30 DIAGNOSIS — E785 Hyperlipidemia, unspecified: Secondary | ICD-10-CM | POA: Diagnosis not present

## 2024-04-30 DIAGNOSIS — E119 Type 2 diabetes mellitus without complications: Secondary | ICD-10-CM

## 2024-04-30 DIAGNOSIS — F200 Paranoid schizophrenia: Secondary | ICD-10-CM

## 2024-04-30 NOTE — Patient Instructions (Signed)
 [name], it was a pleasure seeing you today! You endorsed feeling well today. Below are some of the things we talked about this visit. We look forward to seeing you in the follow up appointment!  Today we discussed: Continue your current medications.   I am going to check some blood work today.   I am referring you to surgery team for the lipoma.   I have ordered the following labs today:   Lab Orders         BMP8+Anion Gap         Iron , TIBC and Ferritin Panel         Lipid Profile         Vitamin B12         Folate       Referrals ordered today:    Referral Orders         Ambulatory referral to General Surgery      I have ordered the following medication/changed the following medications:   Stop the following medications: There are no discontinued medications.   Start the following medications: No orders of the defined types were placed in this encounter.    Follow-up: 2 month follow up   Please make sure to arrive 15 minutes prior to your next appointment. If you arrive late, you may be asked to reschedule.   We look forward to seeing you next time. Please call our clinic at [phone] if you have any questions or concerns. The best time to call is Monday-Friday from 9am-4pm, but there is someone available 24/7. If after hours or the weekend, call the main hospital number and ask for the Internal Medicine Resident On-Call. If you need medication refills, please notify your pharmacy one week in advance and they will send us  a request.  Thank you for letting us  take part in your care. Wishing you the best!  Thank you, Jackolyn Masker, MD

## 2024-04-30 NOTE — Progress Notes (Signed)
 CC: HTN/DMII follow up  HPI:  [name] is a 68 y.o. with medical history of HTN, HLD, DMII, paranoid schizophrenia presenting to [facility] for one month follow up on HTN and DMII.   Please see problem-based list for further details, assessments, and plans.  Past Medical History:  Diagnosis Date   Abscess of buttock, right 08/26/2022   Chronic constipation    Deaf-mutism    Dental caries    History of iron  deficiency anemia    Hx SBO 01/01/2023   Hypertriglyceridemia    Intellectual disability    Internal hemorrhoids 09/22/2020   Intertrigo    Pneumatosis of intestines 03/19/2022   Prediabetes 07/01/2023   Schizophrenia, paranoid, chronic (HCC)    Umbilical hernia    Upper respiratory infection with cough and congestion 12/22/2023    Current Outpatient Medications (Endocrine & Metabolic):    Semaglutide  3 MG TABS, Take 1 tablet (3 mg total) by mouth daily in the afternoon.  Current Outpatient Medications (Cardiovascular):    atenolol  (TENORMIN ) 25 MG tablet, Take 1 tablet (25 mg total) by mouth daily.   pravastatin  (PRAVACHOL ) 40 MG tablet, Take 1 tablet (40 mg total) by mouth daily.   triamterene -hydrochlorothiazide  (DYAZIDE ) 37.5-25 MG capsule, TAKE 1 EACH (1 CAPSULE TOTAL) BY MOUTH DAILY.  Current Outpatient Medications (Respiratory):    cetirizine  (ZYRTEC  ALLERGY) 10 MG tablet, Take 1 tablet (10 mg total) by mouth daily.   fluticasone  (FLONASE ) 50 MCG/ACT nasal spray, Place 1 spray into both nostrils daily.   guaiFENesin  (MUCINEX ) 600 MG 12 hr tablet, Take 1 tablet (600 mg total) by mouth 2 (two) times daily.    Current Outpatient Medications (Other):    fluPHENAZine  decanoate (PROLIXIN ) 25 MG/ML injection, Inject 1.3 mLs (32.5 mg total) into the muscle every 21 ( twenty-one) days.   pantoprazole  (PROTONIX ) 40 MG tablet, TAKE 1 TABLET BY MOUTH TWICE A DAY   phenylephrine-shark liver oil-mineral oil-petrolatum (PREPARATION H) 0.25-3-14-71.9 % rectal ointment,  Place 1 Application rectally 2 (two) times daily as needed for hemorrhoids.   senna (SENOKOT) 8.6 MG TABS tablet, Take 1 tablet (8.6 mg total) by mouth daily as needed for mild constipation.  Review of Systems:  Review of system negative unless stated in the problem list or HPI.    Physical Exam:  Vitals:   04/30/24 1046  BP: 119/76  Pulse: 67  Temp: 98 F (36.7 C)  TempSrc: Oral  SpO2: 94%  Weight: 203 lb 6.4 oz (92.3 kg)  Height: 5\' 6"  (1.676 m)   Physical Exam General: NAD HENT: NCAT Lungs: CTAB, no wheeze, rhonchi or rales.  Cardiovascular: Normal heart sounds, no r/m/g, 2+ pulses in all extremities. No LE edema Abdomen: No TTP, normal bowel sounds MSK: No asymmetry or muscle atrophy.  Skin:10 cm x 10 cm lipoma on back.  Neuro: Alert and oriented x4. CN grossly intact Psych: Normal mood and normal affect   Assessment & Plan:   Type 2 diabetes mellitus (HCC) Patient started on semaglutide  last month.  Will continue at current dose as family is reporting some increased agitation since starting this medication.  Patient does not report any nausea or vomiting since starting this.  Essential hypertension Controlled current regimen.  Will get routine BMP.  Lipoma Referral to general surgery placed for patient lipoma.  This has been present for 5 to 6 years and slowly enlarging.  She reports discomfort when laying on his back.  Nontender to palpation.  Hyperlipidemia associated with type 2 diabetes mellitus (  HCC) Patient on pravastatin  40 mg daily for his hyperlipidemia.  LDL checked and is 50.  Continue current regimen.  PARANOID SCHIZOPHRENIA, CHRONIC Follows with [facility]. Gets prolixin  32.5 mg every 21 days.   Microcytic anemia Noted on previous CBC. Iron  studies, B12, folate ordered.    See Encounters Tab for problem based charting.  Patient Discussed with Dr. Harwood Lingo, MD Tommas Fragmin. [facility] Internal Medicine Residency, PGY-3

## 2024-05-01 DIAGNOSIS — D509 Iron deficiency anemia, unspecified: Secondary | ICD-10-CM | POA: Insufficient documentation

## 2024-05-01 LAB — IRON,TIBC AND FERRITIN PANEL
Ferritin: 115 ng/mL (ref 30–400)
Iron Saturation: 15 % (ref 15–55)
Iron: 52 ug/dL (ref 38–169)
Total Iron Binding Capacity: 345 ug/dL (ref 250–450)
UIBC: 293 ug/dL (ref 111–343)

## 2024-05-01 LAB — LIPID PANEL
Chol/HDL Ratio: 3.1 ratio (ref 0.0–5.0)
Cholesterol, Total: 130 mg/dL (ref 100–199)
HDL: 42 mg/dL (ref >39)
LDL Chol Calc (NIH): 50 mg/dL (ref 0–99)
Triglycerides: 239 mg/dL — ABNORMAL HIGH (ref 0–149)
VLDL Cholesterol Cal: 38 mg/dL (ref 5–40)

## 2024-05-01 LAB — BMP8+ANION GAP
Anion Gap: 17 mmol/L (ref 10.0–18.0)
BUN/Creatinine Ratio: 10 (ref 10–24)
BUN: 11 mg/dL (ref 8–27)
CO2: 24 mmol/L (ref 20–29)
Calcium: 9.4 mg/dL (ref 8.6–10.2)
Chloride: 99 mmol/L (ref 96–106)
Creatinine, Ser: 1.09 mg/dL (ref 0.76–1.27)
Glucose: 103 mg/dL — ABNORMAL HIGH (ref 70–99)
Potassium: 3.5 mmol/L (ref 3.5–5.2)
Sodium: 140 mmol/L (ref 134–144)
eGFR: 75 mL/min/{1.73_m2} (ref >59)

## 2024-05-01 LAB — FOLATE: Folate: 10.6 ng/mL (ref >3.0)

## 2024-05-01 LAB — VITAMIN B12: Vitamin B-12: 516 pg/mL (ref 232–1245)

## 2024-05-01 MED ORDER — FLUPHENAZINE DECANOATE 25 MG/ML IJ SOLN: 33.0000 mg | INTRAMUSCULAR | Status: DC

## 2024-05-01 NOTE — Assessment & Plan Note (Signed)
 Noted on previous CBC. Iron  studies, B12, folate ordered.

## 2024-05-01 NOTE — Assessment & Plan Note (Signed)
 Patient on pravastatin  40 mg daily for his hyperlipidemia.  LDL checked and is 50.  Continue current regimen.

## 2024-05-01 NOTE — Assessment & Plan Note (Signed)
 Follows with [facility]. Gets prolixin  32.5 mg every 21 days.

## 2024-05-01 NOTE — Assessment & Plan Note (Signed)
 Referral to general surgery placed for patient lipoma.  This has been present for 5 to 6 years and slowly enlarging.  She reports discomfort when laying on his back.  Nontender to palpation.

## 2024-05-01 NOTE — Assessment & Plan Note (Signed)
 Patient started on semaglutide  last month.  Will continue at current dose as family is reporting some increased agitation since starting this medication.  Patient does not report any nausea or vomiting since starting this.

## 2024-05-01 NOTE — Assessment & Plan Note (Signed)
 Controlled current regimen.  Will get routine BMP.

## 2024-05-03 ENCOUNTER — Ambulatory Visit: Payer: Self-pay | Admitting: Internal Medicine

## 2024-05-10 ENCOUNTER — Other Ambulatory Visit: Payer: Self-pay | Admitting: Internal Medicine

## 2024-05-10 NOTE — Telephone Encounter (Signed)
 Medication sent to pharmacy

## 2024-05-11 ENCOUNTER — Other Ambulatory Visit: Payer: Self-pay | Admitting: Student

## 2024-05-11 DIAGNOSIS — D500 Iron deficiency anemia secondary to blood loss (chronic): Secondary | ICD-10-CM

## 2024-05-11 NOTE — Telephone Encounter (Signed)
 Patient's LOV was on  04/30/2024 with Dr. Meredeth Stallion and no order was placed for an iron  infusion nor was a referral placed.  Please advise if an order was to be placed.

## 2024-05-11 NOTE — Telephone Encounter (Signed)
 Called and left a  message with the patient's sister Irena Manners) that this will request will need to be discussed with the provider.  A message has been sent.      Copied from CRM [id number]. Topic: Appointments - Appointment Scheduling >> May 11, 2024  8:43 AM Madelyne Schiff wrote: Patient Sister Calling would like to set up her brother, pt Leonard Tucker for an Iron  infusion  Please advise

## 2024-05-12 ENCOUNTER — Encounter: Payer: Self-pay | Admitting: *Deleted

## 2024-05-12 ENCOUNTER — Telehealth: Payer: Self-pay

## 2024-05-12 ENCOUNTER — Telehealth: Payer: Self-pay | Admitting: Internal Medicine

## 2024-05-12 DIAGNOSIS — D509 Iron deficiency anemia, unspecified: Secondary | ICD-10-CM | POA: Insufficient documentation

## 2024-05-12 NOTE — Telephone Encounter (Signed)
 Patient referred to infusion pharmacy team for ambulatory infusion of IV iron .  Insurance - [employer] Medicare Dx code - D50.0/D50.9 IV Iron  Therapy - Feraheme 510 mg x 2  Infusion appointments - [facility] team will schedule patient as soon as possible.   Trevone Prestwood D. Nochum Fenter, PharmD

## 2024-05-12 NOTE — Telephone Encounter (Signed)
 Auth Submission: NO AUTH NEEDED Site of care: Site of care: [facility] Payer: [employer] dual complete medicare Medication & CPT/J Code(s) submitted: Feraheme (ferumoxytol ) [id number] Route of submission (phone, fax, portal):  Phone # Fax # Auth type: Buy/Bill PB Units/visits requested: 510mg  x 2 doses Reference number:  Approval from: 05/12/24 to 09/11/24

## 2024-05-14 NOTE — Addendum Note (Signed)
 Addended by: Priscella Brooms on: 05/14/2024 08:57 AM   Modules accepted: Level of Service

## 2024-05-14 NOTE — Progress Notes (Signed)
 Internal Medicine Clinic Attending  Case discussed with the resident at the time of the visit.  We reviewed the resident's history and exam and pertinent patient test results.  I agree with the assessment, diagnosis, and plan of care documented in the resident's note.

## 2024-05-18 ENCOUNTER — Ambulatory Visit (INDEPENDENT_AMBULATORY_CARE_PROVIDER_SITE_OTHER)

## 2024-05-18 VITALS — BP 115/76 | HR 68 | Temp 98.0°F | Resp 18 | Ht 66.0 in | Wt 203.6 lb

## 2024-05-18 DIAGNOSIS — D509 Iron deficiency anemia, unspecified: Secondary | ICD-10-CM

## 2024-05-18 DIAGNOSIS — D500 Iron deficiency anemia secondary to blood loss (chronic): Secondary | ICD-10-CM

## 2024-05-18 MED ORDER — SODIUM CHLORIDE 0.9 % IV SOLN
510.0000 mg | Freq: Once | INTRAVENOUS | Status: AC
  Administered 2024-05-18: 510 mg via INTRAVENOUS
  Filled 2024-05-18: qty 17

## 2024-05-18 NOTE — Progress Notes (Signed)
 Diagnosis: Iron  Deficiency Anemia  Provider:  Praveen Mannam MD  Procedure: IV Infusion  IV Type: Peripheral, IV Location: L Antecubital  Feraheme (Ferumoxytol ), Dose: 510 mg  Infusion Start Time: 1130  Infusion Stop Time: 1149  Post Infusion IV Care: Observation period completed and Peripheral IV Discontinued  Discharge: Condition: Good, Destination: Home . AVS Declined  Performed by:  Lauran Pollard, LPN

## 2024-05-24 ENCOUNTER — Ambulatory Visit

## 2024-05-25 ENCOUNTER — Encounter: Payer: Self-pay | Admitting: Gastroenterology

## 2024-05-25 ENCOUNTER — Ambulatory Visit

## 2024-05-25 ENCOUNTER — Ambulatory Visit (INDEPENDENT_AMBULATORY_CARE_PROVIDER_SITE_OTHER): Admitting: Gastroenterology

## 2024-05-25 VITALS — BP 118/70 | HR 82 | Ht 66.0 in | Wt 204.6 lb

## 2024-05-25 DIAGNOSIS — K5909 Other constipation: Secondary | ICD-10-CM | POA: Diagnosis not present

## 2024-05-25 DIAGNOSIS — D500 Iron deficiency anemia secondary to blood loss (chronic): Secondary | ICD-10-CM | POA: Diagnosis not present

## 2024-05-25 DIAGNOSIS — K648 Other hemorrhoids: Secondary | ICD-10-CM

## 2024-05-25 NOTE — Patient Instructions (Addendum)
 CAPSULE ENDOSCOPY PATIENT INSTRUCTION SHEET  Leonard Tucker 1956/11/05 [id number]   ______ Seven (7) days prior to capsule endoscopy stop taking iron  supplements and carafate, and Rybelsus .  ______ Two (2) days prior to capsule endoscopy stop taking aspirin or any arthritis drugs.  ______ Day before capsule endoscopy purchase a 238 gram bottle of Miralax  from the laxative section of your drug store, and a 32 oz. bottle of Gatorade (no red).    _______ One (1) day prior to capsule endoscopy: Stop smoking. Eat a regular diet until 12:00 Noon. After 12:00 Noon take only the following: Black coffee  Jell-O (no fruit or red Jell-o) Water   Bouillon (chicken or beef) 7-Up   Cranberry Juice Tea   Kool-Aid Popsicle (not red) Sprite   Coke Ginger Ale  Pepsi Mountain Dew Gatorade At 6:00 pm the evening before your appointment, drink 7 capfuls (105 grams) of Miralax  with 32 oz. Gatorade. Drink 8 oz every 15 minutes until gone. Nothing to eat or drink after midnight except medications with a sip of water.  _______ Day of capsule endoscopy:  No medications for 2 hours prior to your test.  Please arrive at [facility]  3rd floor patient registration area by ___ am on: ___.   For any questions: Call [facility] at [phone] and ask to speak with one of the capsule endoscopy nurses.  YOU WILL NEED TO RETURN THE EQUIPMENT AT 4 PM ON THE DAY OF THE PROCEDURE.  PLEASE KEEP THIS IN MIND WHEN SCHEDULING.    Small Bowel Capsule Endoscopy  What you should know: Small Bowel capsule endoscopy is a procedure that takes pictures of the inside of your small intestine (bowel).  Your small bowel connects to your stomach on one end, and your large bowel (colon) on the other.  A capsule endoscopy is done by swallowing a pill size camera.  The capsule moves through your stomach and into your small bowel, where pictures are taken.   You may need a small bowel capsule endoscopy if you have  symptoms, such as blood in your stool, chronic stomach pain, and diarrhea.  The pictures may show if you have growths, swelling, and bleeding area in you small bowel.  A capsule endoscopy may also show if diseases such as Crohn's or celiac disease are causing your symptoms.  Having a small bowel capsule endoscopy may help you and your caregiver learn the cause of your symptoms.  Learning what is causing your symptoms allows you to receive needed treatment and prevent further problems. Risks: You may have stomach pain during your procedure.   The pictures taken by the capsule may not be clear.   The pictures may not show the cause of your symptoms.   You may need another endoscopy procedure.   The capsule may get trapped in your esophagus or intestines. You may need surgery or additional procedures to remove the capsule from your body.    Before your procedure: You will be instructed to stop certain prescription medications or over- the -counter medications prior to the procedure.   The day before your scheduled appointment you will need to be on a restricted diet and will need to drink a bowel prep that will clean out your bowels.   The day of the procedure: You may drive yourself to the procedure.   You will need to plan on 2 trips to the office on the day of the procedure. Morning: Plan to be at the office about 45  minutes. The morning of the procedure a sensor belt and recorder will be placed on you.  You will wear this for 8 hours.  (The sensor belt transfers pictures of your small bowel to the recorder.)   You will be given a pill-sized capsule endoscope to swallow.  Once you swallow the capsule it will travel through your body the same way food does, constantly taking pictures along the way.  The capsule takes 2-3 pictures a second.   Once you have left the office you may go about your normal day with a few exceptions: You may not go near a MRI machine or a radio or television towers; You  need to avoid other patients having capsule endoscopy; You will be given a written diet to follow for the day.  Afternoon: You will need to be return to the office at your designated time. The sensors belt will be removed You will need to be at the office about 15 minutes.   _______________________________________________________  If your blood pressure at your visit was 140/90 or greater, please contact your primary care physician to follow up on this.  _______________________________________________________  If you are age 23 or older, your body mass index should be between 23-30. Your Body mass index is 33.02 kg/m. If this is out of the aforementioned range listed, please consider follow up with your Primary Care Provider.  If you are age 2 or younger, your body mass index should be between 19-25. Your Body mass index is 33.02 kg/m. If this is out of the aformentioned range listed, please consider follow up with your Primary Care Provider.   ________________________________________________________  The  GI providers would like to encourage you to use MYCHART to communicate with providers for non-urgent requests or questions.  Due to long hold times on the telephone, sending your provider a message by [facility] may be a faster and more efficient way to get a response.  Please allow 48 business hours for a response.  Please remember that this is for non-urgent requests.  _______________________________________________________  Thank you for trusting me with your gastrointestinal care!    Dr. Victory Legrand [facility]

## 2024-05-25 NOTE — Progress Notes (Signed)
 Leonard Tucker GI Progress Note  Chief Complaint: Iron  deficiency anemia  Summary of GI history:  Clinical history from February 2024 office consult note:  Patient was previously seen in 2015 and 2018 by Dr. Elsie Tucker for constipation, blood in stool, and IDA. He had a colonoscopy on Colonoscopy 02/22/2014 with Dr. Cree. These records are not available in EPIC. Referred to us  February 2020 for her constipation, hemorrhoidal bleeding and iron  deficiency anemia for which PCP arranged IV iron    Patient was hospitalized on 03/18/22 for an SBO. He had NG tube placement and Gastrografin  with SBO resolution during his stay. His CT at that time showed pneumatosis in the gastric body and portal veins. He was also found to have IDA.    Patient has a PMHx of schizophrenia and deafness-mutism. His family accompanied him and stated that his Miralax  discontinued due to concerns for possible complications with his other medications.  He has not had any prior abdominal surgeries. He denies any vomiting.   EGD and colonoscopy on 03/06/2023 EGD unrevealing   colonoscopy with grade 3 internal hemorrhoids Plan was for CT enterography to rule out any small bowel stricture or obstructing cause of his prior SBO prior to considering VCE. Patient and/or family at a could not be reached or decided not to do that.  Subjective  HPI: After the above history, I was contacted by the medical resident clinic to revisit this issue, and they were able to arrange a CT enterography with report below.  He has had further IV iron  infusions December 2024 and most recently June 17 (Feraheme )  Leonard Tucker was seen today along with one of his sisters and an ASL interpreter. This older sister does not live with him but was able to get his mother on the phone to get a sense of how often Leonard Tucker is having bowel movements and bleeding.  They do not know for sure but think perhaps he is having a bowel movement about every other day.   Leonard Tucker is able to communicate with our ASL interpreter, but despite that we are getting an inconsistent and uncertain report about how well are often he has BMs.  He does seem to indicate there is bleeding most every day.  He feels as if he needs a bowel movement and will sit and strain.  Using MiraLAX  most every day though sometimes family might not remember to give it to him.  No vomiting or abdominal pain, appetite reportedly very good  ROS: Cardiovascular:  no chest pain Respiratory: no dyspnea  The patient's Past Medical, Family and Social History were reviewed and are on file in the EMR. Past Medical History:  Diagnosis Date   Abscess of buttock, right 08/26/2022   Chronic constipation    Deaf-mutism    Dental caries    History of iron  deficiency anemia    Hx SBO 01/01/2023   Hypertriglyceridemia    Intellectual disability    Internal hemorrhoids 09/22/2020   Intertrigo    Pneumatosis of intestines 03/19/2022   Prediabetes 07/01/2023   Schizophrenia, paranoid, chronic (HCC)    Umbilical hernia    Upper respiratory infection with cough and congestion 12/22/2023    No past surgical history on file.  Objective:  Med list reviewed  Current Outpatient Medications:    atenolol  (TENORMIN ) 25 MG tablet, Take 1 tablet (25 mg total) by mouth daily., Disp: 90 tablet, Rfl: 3   cetirizine  (ZYRTEC  ALLERGY) 10 MG tablet, Take 1 tablet (10 mg total) by  mouth daily., Disp: 90 tablet, Rfl: 2   fluPHENAZine  decanoate (PROLIXIN ) 25 MG/ML injection, Inject 1.3 mLs (32.5 mg total) into the muscle every 21 ( twenty-one) days., Disp: , Rfl:    fluticasone  (FLONASE ) 50 MCG/ACT nasal spray, Place 1 spray into both nostrils daily., Disp: 48 mL, Rfl: 2   guaiFENesin  (MUCINEX ) 600 MG 12 hr tablet, Take 1 tablet (600 mg total) by mouth 2 (two) times daily., Disp: 28 tablet, Rfl: 0   pantoprazole  (PROTONIX ) 40 MG tablet, TAKE 1 TABLET BY MOUTH TWICE A DAY, Disp: 180 tablet, Rfl: 0   phenylephrine-shark  liver oil-mineral oil-petrolatum (PREPARATION H) 0.25-3-14-71.9 % rectal ointment, Place 1 Application rectally 2 (two) times daily as needed for hemorrhoids., Disp: 30 g, Rfl: 0   pravastatin  (PRAVACHOL ) 40 MG tablet, Take 1 tablet (40 mg total) by mouth daily., Disp: 90 tablet, Rfl: 3   RYBELSUS  3 MG TABS, TAKE 1 TABLET BY MOUTH DAILY IN THE AFTERNOON, Disp: 30 tablet, Rfl: 0   senna (SENOKOT) 8.6 MG TABS tablet, Take 1 tablet (8.6 mg total) by mouth daily as needed for mild constipation., Disp: 120 tablet, Rfl: 0   triamterene -hydrochlorothiazide  (DYAZIDE ) 37.5-25 MG capsule, TAKE 1 EACH (1 CAPSULE TOTAL) BY MOUTH DAILY., Disp: 90 capsule, Rfl: 3   Vital signs in last 24 hrs: Vitals:   05/25/24 0822  BP: 118/70  Pulse: 82   Wt Readings from Last 3 Encounters:  05/25/24 204 lb 9.6 oz (92.8 kg)  05/18/24 203 lb 9.6 oz (92.4 kg)  04/30/24 203 lb 6.4 oz (92.3 kg)    Physical Exam  He is well-appearing HEENT: sclera anicteric, oral mucosa moist without lesions Neck: supple, no thyromegaly, JVD or lymphadenopathy Cardiac: Regular without appreciable murmur,  no peripheral edema Pulm: clear to auscultation bilaterally, normal RR and effort noted Abdomen: soft, obese, no tenderness, with active bowel sounds. No guarding or palpable hepatosplenomegaly. Rectal-mildly decreased resting sphincter tone.  I am unable to get him to squeeze or bear down, not clear he understands his instructions through the interpreter. Stool is soft and light brown  Labs:     Latest Ref Rng & Units 03/02/2024   11:18 AM 12/30/2023   11:47 AM 09/30/2023   11:30 AM  CBC  WBC 3.4 - 10.8 x10E3/uL 8.0  7.4  7.3   Hemoglobin 13.0 - 17.7 g/dL 87.6  87.4  87.4   Hematocrit 37.5 - 51.0 % 39.3  40.8  41.8   Platelets 150 - 450 x10E3/uL 278  218  293       Latest Ref Rng & Units 04/30/2024   11:35 AM 07/01/2023    3:25 PM 01/30/2023    9:57 AM  CMP  Glucose 70 - 99 mg/dL 896  884  883   BUN 8 - 27 mg/dL 11  12   12    Creatinine 0.76 - 1.27 mg/dL 8.90  9.03  8.86   Sodium 134 - 144 mmol/L 140  138  139   Potassium 3.5 - 5.2 mmol/L 3.5  3.6  3.3   Chloride 96 - 106 mmol/L 99  99  98   CO2 20 - 29 mmol/L 24  23  29    Calcium  8.6 - 10.2 mg/dL 9.4  9.6  9.6   Total Protein 6.0 - 8.3 g/dL   8.0   Total Bilirubin 0.2 - 1.2 mg/dL   0.3   Alkaline Phos 39 - 117 U/L   73   AST 0 - 37 U/L  20   ALT 0 - 53 U/L   28    Iron /TIBC/Ferritin/ %Sat    Component Value Date/Time   IRON  52 04/30/2024 1135   TIBC 345 04/30/2024 1135   FERRITIN 115 04/30/2024 1135   IRONPCTSAT 15 04/30/2024 1135    ___________________________________________ Radiologic studies: CLINICAL DATA:  History of high-grade small-bowel obstruction and iron  deficiency anemia.   EXAM: CT ABDOMEN AND PELVIS WITH CONTRAST (ENTEROGRAPHY)   TECHNIQUE: Multidetector CT of the abdomen and pelvis during bolus administration of intravenous contrast. Negative oral contrast was given.   RADIATION DOSE REDUCTION: This exam was performed according to the departmental dose-optimization program which includes automated exposure control, adjustment of the mA and/or kV according to patient size and/or use of iterative reconstruction technique.   CONTRAST:  75mL OMNIPAQUE  IOHEXOL  350 MG/ML SOLN   COMPARISON:  CT scan 03/19/2022   FINDINGS: Lower chest: Basilar scarring and atelectasis but no worrisome pulmonary lesions or pleural effusions.   Hepatobiliary: No hepatic lesions or intrahepatic biliary dilatation. Gallbladder is unremarkable. No common bile duct dilatation.   Pancreas: No mass, inflammatory changes, or other significant abnormality.   Spleen: Within normal limits in size and appearance.   Adrenals/Urinary Tract: The adrenal glands are normal.   Stable bilateral nonenhancing renal cysts not requiring any further imaging evaluation or follow-up. No worrisome renal lesions or hydronephrosis. The bladder is  unremarkable.   Stomach/Bowel: The stomach is well distended with fluid. No worrisome gastric lesions or evidence of acute inflammatory process. The duodenum, small bowel and terminal ileum are unremarkable. No findings for inflammatory bowel disease and no evidence of obstruction. The colon is unremarkable. No colonic mass obstructive process. No evidence of acute or chronic colitis. Mild sigmoid colon diverticulosis.   Vascular/Lymphatic: The aorta and branch vessels are patent. No aneurysm or dissection. Minimal scattered distal aortic and iliac artery calcifications. The major venous structures are patent. No abdominal or pelvic lymphadenopathy.   Reproductive: The prostate gland and seminal vesicles are unremarkable.   Other: No ascites. Periumbilical abdominal wall hernia containing fat.   Musculoskeletal: No acute bony findings or worrisome bone lesions. T9 hemivertebra deformity noted.   IMPRESSION: 1. No acute abdominal/pelvic findings, mass lesions or adenopathy. 2. No findings for inflammatory bowel disease or bowel obstruction. 3. Mild sigmoid colon diverticulosis. 4. Periumbilical abdominal wall hernia containing fat.     Electronically Signed   By: MYRTIS Stammer M.D.   On: 02/14/2024 21:09  ____________________________________________ Other:   _____________________________________________ Assessment & Plan  Assessment: Encounter Diagnoses  Name Primary?   Iron  deficiency anemia due to chronic blood loss Yes   Bleeding internal hemorrhoids    Chronic constipation    IDA may be on the basis of his hemorrhoidal blood loss, though we must consider small bowel source as well. I explained his previous endoscopic port findings and the possibility of a small bowel source requiring a video capsule study for further evaluation.  I also reviewed the CT enterography and its indication to rule out any apparent small bowel stricture or obstruction.  He also does not  have obstructive symptoms despite previous hospitalization for SBO. Approximate 1% risk of capsule retention reviewed which uncommonly would require surgery.  This is all described in detail to him through the interpreter along with his family and they wish to proceed. Will hold his GLP-1 agonist 6 to 7 days prior to the capsule study to decrease the chance of prolonged gastric retention and incomplete small bowel images.  This  constipation seems most likely a dyssynergia defecation that will be frankly challenging to diagnose with either anorectal manometry or pelvic PT given the communication challenges.  It seems to me that the stool is already soft and therefore there is likely little benefit in increasing the dose or frequency of MiraLAX  or using a pro secretory agent that may lead to loose stool and incontinence. That will make hemorrhoidal treatment more challenging since continued straining is contributing to the hemorrhoids. Nevertheless, I offered hemorrhoidal banding as an option prior to consideration of surgical hemorrhoidectomy.  Procedure was described in detail along with risks and benefits including the need for a usual 3 session banding series.  They were agreeable and the first session was scheduled.    35 minutes were spent on this encounter (including chart review, history/exam, counseling/coordination of care, and documentation) > 50% of that time was spent on counseling and coordination of care.   Victory LITTIE Brand III

## 2024-05-26 ENCOUNTER — Telehealth: Payer: Self-pay | Admitting: Gastroenterology

## 2024-05-26 DIAGNOSIS — K625 Hemorrhage of anus and rectum: Secondary | ICD-10-CM

## 2024-05-26 DIAGNOSIS — D509 Iron deficiency anemia, unspecified: Secondary | ICD-10-CM

## 2024-05-26 NOTE — Telephone Encounter (Signed)
 Inbound call from patient sister to scheduled capsule endo. Please advise.   Thank you

## 2024-05-26 NOTE — Telephone Encounter (Signed)
 Spoke with patient's sister Samule Sor. Capsule endoscopy scheduled for 06/25/2024. Amb referral ordered. Sister requested instructions be mailed to her [address] [place], [place] [address]. Printed and sent. Advised sister to call back with any questions. Verbalized understanding.

## 2024-05-28 ENCOUNTER — Ambulatory Visit

## 2024-06-01 ENCOUNTER — Ambulatory Visit

## 2024-06-03 ENCOUNTER — Ambulatory Visit (INDEPENDENT_AMBULATORY_CARE_PROVIDER_SITE_OTHER)

## 2024-06-03 VITALS — BP 116/75 | HR 71 | Temp 97.5°F | Resp 20 | Ht 66.0 in | Wt 204.1 lb

## 2024-06-03 DIAGNOSIS — D509 Iron deficiency anemia, unspecified: Secondary | ICD-10-CM | POA: Diagnosis not present

## 2024-06-03 DIAGNOSIS — D500 Iron deficiency anemia secondary to blood loss (chronic): Secondary | ICD-10-CM

## 2024-06-03 MED ORDER — SODIUM CHLORIDE 0.9 % IV SOLN
510.0000 mg | Freq: Once | INTRAVENOUS | Status: AC
  Administered 2024-06-03: 510 mg via INTRAVENOUS
  Filled 2024-06-03: qty 17

## 2024-06-03 NOTE — Progress Notes (Signed)
 Diagnosis: Iron  Deficiency Anemia  Provider:  Praveen Mannam MD  Procedure: IV Infusion  IV Type: Peripheral, IV Location: L Antecubital  Feraheme  (Ferumoxytol ), Dose: 510 mg  Infusion Start Time: 0905  Infusion Stop Time: 0920  Post Infusion IV Care: Observation period completed and Peripheral IV Discontinued  Discharge: Condition: Good, Destination: Home . AVS Declined  Performed by:  Miangel Flom G Pilkington-Burchett, RN

## 2024-06-16 ENCOUNTER — Other Ambulatory Visit: Payer: Self-pay | Admitting: Student

## 2024-06-16 MED ORDER — RYBELSUS 3 MG PO TABS: 1.0000 | ORAL_TABLET | Freq: Every day | ORAL | 2 refills | Status: DC

## 2024-06-16 NOTE — Telephone Encounter (Signed)
 Copied from CRM [id number]. Topic: Clinical - Medication Refill >> Jun 16, 2024 11:01 AM Susanna ORN wrote: Medication: RYBELSUS  3 MG TABS  Has the patient contacted their pharmacy? Yes, stated he has no refills left.  (Agent: If no, request that the patient contact the pharmacy for the refill. If patient does not wish to contact the pharmacy document the reason why and proceed with request.) (Agent: If yes, when and what did the pharmacy advise?)  This is the patient's preferred pharmacy:  [id number] [place], [place] - [address] [facility] [address] [place] [address] Phone: [phone] Fax: [phone]  Is this the correct pharmacy for this prescription? Yes If no, delete pharmacy and type the correct one.   Has the prescription been filled recently? Yes  Is the patient out of the medication? Yes, has one pill left.  Has the patient been seen for an appointment in the last year OR does the patient have an upcoming appointment? Yes  Can we respond through MyChart? No  Agent: Please be advised that Rx refills may take up to 3 business days. We ask that you follow-up with your pharmacy.

## 2024-06-17 ENCOUNTER — Encounter: Payer: Self-pay | Admitting: Gastroenterology

## 2024-06-17 ENCOUNTER — Telehealth: Payer: Self-pay | Admitting: Gastroenterology

## 2024-06-17 ENCOUNTER — Ambulatory Visit: Admitting: Gastroenterology

## 2024-06-17 VITALS — BP 122/70 | HR 78 | Ht 66.0 in | Wt 205.0 lb

## 2024-06-17 DIAGNOSIS — K641 Second degree hemorrhoids: Secondary | ICD-10-CM

## 2024-06-17 DIAGNOSIS — K648 Other hemorrhoids: Secondary | ICD-10-CM

## 2024-06-17 NOTE — Patient Instructions (Signed)

## 2024-06-17 NOTE — Telephone Encounter (Signed)
 Spoke with patient's sister. Relayed message that patient does not need oral iron  at this time. Verbalized understanding.

## 2024-06-17 NOTE — Progress Notes (Signed)
 PROCEDURE NOTE: The patient presents with symptomatic grade 2  hemorrhoids, requesting rubber band ligation of his/her hemorrhoidal disease.  All risks, benefits and alternative forms of therapy were described and informed consent was obtained.  DRE revealed: Redundant skin folds and decreased resting sphincter tone And anoscopy revealed markedly enlarged right sided hemorrhoidal columns, primarily RP  The anorectum was pre-medicated with 0.125% NTG and lubricant. The decision was made to band the RP internal hemorrhoids, and the [facility] O'Regan System was used to perform band ligation without complication.  Digital anorectal examination was then performed to assure proper positioning of the band, and to adjust the banded tissue as required.  The patient was discharged home without pain or other issues.  Dietary and behavioral recommendations were given and along with follow-up instructions.     The following adjunctive treatments were recommended:  None  The patient will return several weeks for  follow-up and possible additional banding as required. No complications were encountered and the patient tolerated the procedure well.  [place] Victory Brand, MD

## 2024-06-17 NOTE — Telephone Encounter (Signed)
 He has rec'd IV iron  recently, and so I do not think he needs oral iron  at this time.  - H. Danis

## 2024-06-17 NOTE — Telephone Encounter (Signed)
 Inbound call from patient's sister stating she was advised that patient should start taking iron  supplements. Advised that patient is already completing iron  infusions and the last one was about a week ago. Requesting a call to confirm patient does not have to start taking supplement. Please advise, thank you.

## 2024-06-24 ENCOUNTER — Telehealth: Payer: Self-pay

## 2024-06-24 NOTE — Telephone Encounter (Signed)
 Pts sister is calling about his procedure (capsule endoscopy) that is scheduled tomorrow 06/25/24. The instructions state to eat a regular diet until noon. However, he ate 2 pieces of chicken and a salad at 1 pm today. His sister would like to know if its still okay to proceed with the procedure or if they will need to reschedule.   Please advise.

## 2024-06-24 NOTE — Telephone Encounter (Signed)
 OK to proceed. Liquids only for remainder of today and follow the rest of instructions as given.  - HD

## 2024-06-25 ENCOUNTER — Ambulatory Visit: Admitting: Gastroenterology

## 2024-06-25 DIAGNOSIS — D500 Iron deficiency anemia secondary to blood loss (chronic): Secondary | ICD-10-CM

## 2024-06-25 NOTE — Progress Notes (Signed)
 SN: [id number] Exp: 2025-02-11 LOT: [id number]  Patient arrived for VCE. Reported the prep went well. This RN explained capsule dietary restrictions for the next few hours. Pt advised to return at 4 pm to return capsule equipment.  Patient verbalized understanding. Opened capsule, ensured capsule was flashing prior to the patient swallowing the capsule. Patient swallowed capsule without difficulty.  Patient told to call the office with any questions and if capsule has not passed after 72 hours. No further questions by the conclusion of the visit.

## 2024-06-25 NOTE — Telephone Encounter (Signed)
 Pts sister, Samule, informed of provider recommendation. She states understanding.

## 2024-06-25 NOTE — Patient Instructions (Signed)
You may have clear liquids beginning at 10:30 am after ingesting the capsule.    You can have a light lunch at 12:30 pm; sandwich and half bowl of soup.  Return to the office at 4 pm to return the equipment.   Return to you normal diet at 5 pm.   Call [phone] and ask for Glendora Score, RN if you have any questions.  You should pass the capsule in your stool 8-48 hours after ingestion. If you have not passed the capsule, after 72 hours, please contact the office at [phone].

## 2024-06-25 NOTE — Progress Notes (Signed)
 This patient is appearing on a report for being at risk of failing the adherence measure for diabetes medications this calendar year.   Medication: Rybelsus  3 mg tablet Last fill date: 06/17/24 for 30 day supply - A1C is well controlled  Insurance report was not up to date. No action needed at this time.   Lorain Baseman, PharmD [facility] [phone]

## 2024-06-29 NOTE — Telephone Encounter (Signed)
 LM for Samule (pts sister and emergency contact) advising its okay if they aren't sure he passed the capsule or not, the recorder will let us  know everything we need to know. Advised to call our office if she has any further questions.

## 2024-06-29 NOTE — Telephone Encounter (Signed)
 Patient sister called to confirm that when their mother went to check to see if the patient passed the capsule they patient had flushed the toilet,so as of right now they are not sure if the patient passed that capsule or not. Patient sister is requesting a call back. Please advise.

## 2024-06-30 ENCOUNTER — Ambulatory Visit: Admitting: Student

## 2024-06-30 VITALS — BP 123/72 | HR 83 | Temp 98.1°F | Wt 209.0 lb

## 2024-06-30 DIAGNOSIS — E1169 Type 2 diabetes mellitus with other specified complication: Secondary | ICD-10-CM | POA: Diagnosis not present

## 2024-06-30 DIAGNOSIS — E785 Hyperlipidemia, unspecified: Secondary | ICD-10-CM

## 2024-06-30 DIAGNOSIS — I10 Essential (primary) hypertension: Secondary | ICD-10-CM | POA: Diagnosis not present

## 2024-06-30 DIAGNOSIS — Z7984 Long term (current) use of oral hypoglycemic drugs: Secondary | ICD-10-CM

## 2024-06-30 DIAGNOSIS — F200 Paranoid schizophrenia: Secondary | ICD-10-CM

## 2024-06-30 DIAGNOSIS — E119 Type 2 diabetes mellitus without complications: Secondary | ICD-10-CM

## 2024-06-30 LAB — POCT GLYCOSYLATED HEMOGLOBIN (HGB A1C): Hemoglobin A1C: 6.4 % — AB (ref 4.0–5.6)

## 2024-06-30 LAB — GLUCOSE, CAPILLARY: Glucose-Capillary: 175 mg/dL — ABNORMAL HIGH (ref 70–99)

## 2024-06-30 MED ORDER — SEMAGLUTIDE 7 MG PO TABS
7.0000 mg | ORAL_TABLET | Freq: Every day | ORAL | 0 refills | Status: DC
Start: 2024-06-30 — End: 2024-09-10

## 2024-06-30 NOTE — Progress Notes (Unsigned)
 CC: Follow-up  HPI:  Leonard Tucker is a 68 y.o. male living with a history stated below and presents today for follow-up. Please see problem based assessment and plan for additional details.  Past Medical History:  Diagnosis Date   Abscess of buttock, right 08/26/2022   Chronic constipation    Deaf-mutism    Dental caries    History of iron  deficiency anemia    Hx SBO 01/01/2023   Hypertriglyceridemia    Intellectual disability    Internal hemorrhoids 09/22/2020   Intertrigo    Pneumatosis of intestines 03/19/2022   Prediabetes 07/01/2023   Schizophrenia, paranoid, chronic (HCC)    Umbilical hernia    Upper respiratory infection with cough and congestion 12/22/2023    Current Outpatient Medications on File Prior to Visit  Medication Sig Dispense Refill   atenolol  (TENORMIN ) 25 MG tablet Take 1 tablet (25 mg total) by mouth daily. 90 tablet 3   cetirizine  (ZYRTEC  ALLERGY) 10 MG tablet Take 1 tablet (10 mg total) by mouth daily. 90 tablet 2   fluPHENAZine  decanoate (PROLIXIN ) 25 MG/ML injection Inject 1.3 mLs (32.5 mg total) into the muscle every 21 ( twenty-one) days.     fluticasone  (FLONASE ) 50 MCG/ACT nasal spray Place 1 spray into both nostrils daily. 48 mL 2   guaiFENesin  (MUCINEX ) 600 MG 12 hr tablet Take 1 tablet (600 mg total) by mouth 2 (two) times daily. (Patient not taking: Reported on 06/17/2024) 28 tablet 0   pantoprazole  (PROTONIX ) 40 MG tablet TAKE 1 TABLET BY MOUTH TWICE A DAY 180 tablet 0   phenylephrine-shark liver oil-mineral oil-petrolatum (PREPARATION H) 0.25-3-14-71.9 % rectal ointment Place 1 Application rectally 2 (two) times daily as needed for hemorrhoids. 30 g 0   pravastatin  (PRAVACHOL ) 40 MG tablet Take 1 tablet (40 mg total) by mouth daily. 90 tablet 3   senna (SENOKOT) 8.6 MG TABS tablet Take 1 tablet (8.6 mg total) by mouth daily as needed for mild constipation. 120 tablet 0   triamterene -hydrochlorothiazide  (DYAZIDE ) 37.5-25 MG capsule TAKE 1  EACH (1 CAPSULE TOTAL) BY MOUTH DAILY. 90 capsule 3   No current facility-administered medications on file prior to visit.    Family History  Problem Relation Age of Onset   Hypertension Mother    Diabetes Maternal Aunt    Colon cancer Neg Hx    Rectal cancer Neg Hx    Stomach cancer Neg Hx    Esophageal cancer Neg Hx     Social History   Socioeconomic History   Marital status: Single    Spouse name: Not on file   Number of children: Not on file   Years of education: Not on file   Highest education level: Not on file  Occupational History   Not on file  Tobacco Use   Smoking status: Never   Smokeless tobacco: Never  Substance and Sexual Activity   Alcohol  use: No    Alcohol /week: 0.0 standard drinks of alcohol    Drug use: No   Sexual activity: Not on file  Other Topics Concern   Not on file  Social History Narrative   His mother, [name] is his primary caretaker and he lives with her.    Communicates via sign language          Social Drivers of Health   Financial Resource Strain: Low Risk  (12/18/2023)   Overall Financial Resource Strain (CARDIA)    Difficulty of Paying Living Expenses: Not very hard  Food Insecurity: No  Food Insecurity (12/18/2023)   Hunger Vital Sign    Worried About Running Out of Food in the Last Year: Never true    Ran Out of Food in the Last Year: Never true  Transportation Needs: No Transportation Needs (12/18/2023)   PRAPARE - Administrator, Civil Service (Medical): No    Lack of Transportation (Non-Medical): No  Physical Activity: Inactive (12/18/2023)   Exercise Vital Sign    Days of Exercise per Week: 0 days    Minutes of Exercise per Session: 0 min  Stress: No Stress Concern Present (12/18/2023)   [employer] of Occupational Health - Occupational Stress Questionnaire    Feeling of Stress : Not at all  Social Connections: Moderately Isolated (12/18/2023)   Social Connection and Isolation Panel    Frequency of  Communication with Friends and Family: More than three times a week    Frequency of Social Gatherings with Friends and Family: More than three times a week    Attends Religious Services: 1 to 4 times per year    Active Member of [employer] or Organizations: No    Attends Banker Meetings: Never    Marital Status: Never married  Intimate Partner Violence: Not At Risk (12/18/2023)   Humiliation, Afraid, Rape, and Kick questionnaire    Fear of Current or Ex-Partner: No    Emotionally Abused: No    Physically Abused: No    Sexually Abused: No    Review of Systems: ROS negative except for what is noted on the assessment and plan.  Vitals:   06/30/24 1057  BP: 123/72  Pulse: 83  Temp: 98.1 F (36.7 C)  TempSrc: Oral  SpO2: 99%  Weight: 209 lb (94.8 kg)    Physical Exam: Constitutional: obese, sitting in chair, in no acute distress Cardiovascular: regular rate and rhythm, no m/r/g Pulmonary/Chest: normal work of breathing on room air, lungs clear to auscultation bilaterally Abdominal: soft, non-tender, non-distended  Assessment & Plan:     Patient discussed with Dr. Jeanelle  Type 2 diabetes mellitus (HCC) A1c today is 6.4.  Currently managed with Rybelsus  3 mg daily.  Patient/family inquiring about something to help with weight loss.  Given his mental retardation, lifestyle modifications prove difficult though I did counsel on this today.  In an effort to further improve A1c and weight loss, will increase Rybelsus  to 7 mg daily as he is tolerating current dose well.  Referral sent to ophthalmology.  PARANOID SCHIZOPHRENIA, CHRONIC Follows with [facility]. Gets prolixin  32.5 mg every 21 days.     Hyperlipidemia associated with type 2 diabetes mellitus (HCC) LDL was 50 in 04/2024.  Continue pravastatin  40 mg daily.  Essential hypertension Normotensive.  Continue Dyazide  37.5-25 mg daily and atenolol  25 mg daily.   Norman Lobstein, D.O. [facility],  PGY-2 Phone: [phone] Date 07/01/2024 Time 2:09 PM

## 2024-07-01 NOTE — Assessment & Plan Note (Signed)
 Follows with [facility]. Gets prolixin  32.5 mg every 21 days.

## 2024-07-01 NOTE — Assessment & Plan Note (Signed)
 Normotensive.  Continue Dyazide  37.5-25 mg daily and atenolol  25 mg daily.

## 2024-07-01 NOTE — Assessment & Plan Note (Signed)
 LDL was 50 in 04/2024.  Continue pravastatin  40 mg daily.

## 2024-07-01 NOTE — Progress Notes (Signed)
 Internal Medicine Clinic Attending  Case discussed with the resident at the time of the visit.  We reviewed the resident's history and exam and pertinent patient test results.  I agree with the assessment, diagnosis, and plan of care documented in the resident's note.

## 2024-07-01 NOTE — Assessment & Plan Note (Addendum)
 A1c today is 6.4.  Currently managed with Rybelsus  3 mg daily.  Patient/family inquiring about something to help with weight loss.  Given his mental retardation, lifestyle modifications prove difficult though I did counsel on this today.  In an effort to further improve A1c and weight loss, will increase Rybelsus  to 7 mg daily as he is tolerating current dose well.  Referral sent to ophthalmology.

## 2024-07-09 ENCOUNTER — Telehealth: Payer: Self-pay

## 2024-07-09 DIAGNOSIS — Z189 Retained foreign body fragments, unspecified material: Secondary | ICD-10-CM

## 2024-07-09 NOTE — Telephone Encounter (Signed)
 Spoke with Samule Sor, sister of the patient. She will bring in the patient 07/14/24 to have the KUB done at [facility]. She does not know if the patient has seen the capsule pass. Order for imaging placed.

## 2024-07-09 NOTE — Telephone Encounter (Signed)
-----   Message from Victory LITTIE Brand III sent at 07/09/2024  6:35 AM EDT ----- Regarding: RE: Capsule Bummer, thanks for letting me know.  Yes, he is on Rybelsus  (3 mg once daily at the time of the study, then was increased to 7 mg once daily by his PCP on 06/30/2024).  It is my oversight that I did not give instructions to hold that.  Nursing, this patient is hearing impaired and only communicates by ASL, but his family is always with him at visits and they should be contacted with this information.  It is not clear they will be able to find out from him if he passed the capsule since he also has schizophrenia and communication is challenging.  So he should have a KUB, and then we will decide our next move.  As an aside, I also sent an email to [facility] so we can add GLP-1 agonist medicine to our precapsule checklist.  HD ----- Message ----- From: Ever Greig RAMAN, PA-C Sent: 07/08/2024   6:00 PM EDT To: Victory LITTIE Brand DOUGLAS, MD; Nyla DELENA Basket, RN Subject: Capsule                                        Capsule ready on this pt   Incomplete with retention in the stomach for the entire study - ? Forget to stop GLP-1 or it is just still working very well:)   Reports on your desk -   Nurse - pt needs a KUB  if he has not passed it - study was done 7/25- sorry if sent to wrong nurse , please forward

## 2024-07-14 ENCOUNTER — Ambulatory Visit: Admitting: Gastroenterology

## 2024-07-14 ENCOUNTER — Encounter: Payer: Self-pay | Admitting: Gastroenterology

## 2024-07-14 ENCOUNTER — Ambulatory Visit (INDEPENDENT_AMBULATORY_CARE_PROVIDER_SITE_OTHER)
Admission: RE | Admit: 2024-07-14 | Discharge: 2024-07-14 | Disposition: A | Discharge status: Home / Self Care | Source: Ambulatory Visit | Attending: Gastroenterology | Admitting: Gastroenterology

## 2024-07-14 VITALS — BP 122/78 | HR 78 | Ht 66.0 in | Wt 202.0 lb

## 2024-07-14 DIAGNOSIS — K5641 Fecal impaction: Secondary | ICD-10-CM | POA: Diagnosis not present

## 2024-07-14 DIAGNOSIS — K648 Other hemorrhoids: Secondary | ICD-10-CM

## 2024-07-14 DIAGNOSIS — K641 Second degree hemorrhoids: Secondary | ICD-10-CM | POA: Diagnosis not present

## 2024-07-14 DIAGNOSIS — Z189 Retained foreign body fragments, unspecified material: Secondary | ICD-10-CM

## 2024-07-14 NOTE — Progress Notes (Signed)
 PROCEDURE NOTE: The patient presents with symptomatic grade 2  hemorrhoids, requesting rubber band ligation of his/her hemorrhoidal disease.  All risks, benefits and alternative forms of therapy were described and informed consent was obtained.  DRE revealed: nml except redundant perianal skin   The anorectum was pre-medicated with 0.125% NTG and lubricant. The decision was made to band the RA internal hemorrhoids, and the [facility] O'Regan System was used to perform band ligation without complication.  Digital anorectal examination was then performed to assure proper positioning of the band, and to adjust the banded tissue as required.  The patient was discharged home without pain or other issues.  Dietary and behavioral recommendations were given and along with follow-up instructions.     The following adjunctive treatments were recommended:  none  The patient will return 3-4 weeks  for  follow-up and possible additional banding as required. No complications were encountered and the patient tolerated the procedure well.   Sign language interpreter present for entire encounter  Patient going to Xray for KUB to check on passage of video capsule   - Victory Brand, MD

## 2024-07-14 NOTE — Patient Instructions (Addendum)
 Your provider has requested that you have an abdominal x ray before leaving today. Please go to the basement floor to our Radiology department for the test.   HEMORRHOID BANDING PROCEDURE    FOLLOW-UP CARE   The procedure you have had should have been relatively painless since the banding of the area involved does not have nerve endings and there is no pain sensation.  The rubber band cuts off the blood supply to the hemorrhoid and the band may fall off as soon as 48 hours after the banding (the band may occasionally be seen in the toilet bowl following a bowel movement). You may notice a temporary feeling of fullness in the rectum which should respond adequately to plain Tylenol  or Motrin .  Following the banding, avoid strenuous exercise that evening and resume full activity the next day.  A sitz bath (soaking in a warm tub) or bidet is soothing, and can be useful for cleansing the area after bowel movements.     To avoid constipation, take two tablespoons of natural wheat bran, natural oat bran, flax, Benefiber or any over the counter fiber supplement and increase your water intake to 7-8 glasses daily.    Unless you have been prescribed anorectal medication, do not put anything inside your rectum for two weeks: No suppositories, enemas, fingers, etc.  Occasionally, you may have more bleeding than usual after the banding procedure.  This is often from the untreated hemorrhoids rather than the treated one.  Don't be concerned if there is a tablespoon or so of blood.  If there is more blood than this, lie flat with your bottom higher than your head and apply an ice pack to the area. If the bleeding does not stop within a half an hour or if you feel faint, call our office at [phone] or go to the emergency room.  Problems are not common; however, if there is a substantial amount of bleeding, severe pain, chills, fever or difficulty passing urine (very rare) or other problems, you should  call us  at [phone] or report to the nearest emergency room.  Do not stay seated continuously for more than 2-3 hours for a day or two after the procedure.  Tighten your buttock muscles 10-15 times every two hours and take 10-15 deep breaths every 1-2 hours.  Do not spend more than a few minutes on the toilet if you cannot empty your bowel; instead re-visit the toilet at a later time.  _______________________________________________________  If your blood pressure at your visit was 140/90 or greater, please contact your primary care physician to follow up on this.  _______________________________________________________  If you are age 66 or older, your body mass index should be between 23-30. Your Body mass index is 32.6 kg/m. If this is out of the aforementioned range listed, please consider follow up with your Primary Care Provider.  If you are age 40 or younger, your body mass index should be between 19-25. Your Body mass index is 32.6 kg/m. If this is out of the aformentioned range listed, please consider follow up with your Primary Care Provider.   ________________________________________________________  The [facility] providers would like to encourage you to use MYCHART to communicate with providers for non-urgent requests or questions.  Due to long hold times on the telephone, sending your provider a message by [facility] may be a faster and more efficient way to get a response.  Please allow 48 business hours for a response.  Please remember that  this is for non-urgent requests.  _______________________________________________________  [facility] is using a team-based approach to care.  Your team is made up of your doctor and two to three APPS. Our APPS (Nurse Practitioners and Physician Assistants) work with your physician to ensure care continuity for you. They are fully qualified to address your health concerns and develop a treatment plan. They communicate directly  with your gastroenterologist to care for you. Seeing the Advanced Practice Practitioners on your physician's team can help you by facilitating care more promptly, often allowing for earlier appointments, access to diagnostic testing, procedures, and other specialty referrals.     Thank you for trusting me with your gastrointestinal care!    Dr. Victory Legrand DOUGLAS [facility]

## 2024-07-15 ENCOUNTER — Ambulatory Visit: Payer: Self-pay | Admitting: Gastroenterology

## 2024-07-15 ENCOUNTER — Telehealth: Payer: Self-pay | Admitting: Gastroenterology

## 2024-07-15 ENCOUNTER — Ambulatory Visit

## 2024-07-15 ENCOUNTER — Telehealth: Payer: Self-pay | Admitting: Student

## 2024-07-15 ENCOUNTER — Ambulatory Visit: Payer: Self-pay | Admitting: *Deleted

## 2024-07-15 NOTE — Telephone Encounter (Signed)
 Thank you for the update, and I agree it does not sound like he coughed/vomited up the capsule device.  We will let them know when we get a report on the x-ray.  - HD

## 2024-07-15 NOTE — Telephone Encounter (Signed)
 Pt has an appt with Dr Benuel today 07/15/24.

## 2024-07-15 NOTE — Telephone Encounter (Signed)
 FYI Only or Action Required?: FYI only for provider.  Patient was last seen in primary care on 06/30/2024 by Marylu Gee, DO.  Called Nurse Triage reporting Cyst.  Symptoms began several days ago.  Interventions attempted: Nothing.  Symptoms are: rapidly worsening.  Triage Disposition: See Physician Within 24 Hours  Patient/caregiver understands and will follow disposition?: Yes   Reason for Disposition  [1] Swelling is painful to touch AND [2] no fever  Answer Assessment - Initial Assessment Questions 1. APPEARANCE of SWELLING: What does it look like?     Getting larger 2. SIZE: How large is the swelling? (e.g., inches, cm; or compare to size of pinhead, tip of pen, eraser, coin, pea, grape, ping pong ball)      Cantaloupe size 3. LOCATION: Where is the swelling located?     Upper R- between spine and shoulder 4. ONSET: When did the swelling start?     Chronic- years 5. COLOR: What color is it? Is there more than one color?     Normal flesh color 6. PAIN: Is there any pain? If Yes, ask: How bad is the pain? (Scale 1-10; or mild, moderate, severe)       Patient is pointing at it- unsure if painful 7. ITCH: Does it itch? If Yes, ask: How bad is the itch?      unknown 8. CAUSE: What do you think caused the swelling?     unknown  Protocols used: Skin Lump or Localized Swelling-A-AH  Copied from CRM #[id number]. Topic: Clinical - Red Word Triage >> Jul 15, 2024  8:45 AM Zane F wrote: [facility] that prompted transfer to Nurse Triage:   Knot on back of the patient growing; patient is non verbal and deaf; patient is signaling toward the knot but can not communicate what is happening

## 2024-07-15 NOTE — Telephone Encounter (Signed)
 Received call from patients sister regarding Capsule endo done on 7/25, she states the capsule did not pass fully but came back up through the patients mouth, is wanting call back to discuss solutions. Please review and advise.  Thank you

## 2024-07-15 NOTE — Telephone Encounter (Signed)
 Spoke with Ms. Burnard, sister of the patient. She said a white and yellow pill was coughed up by the patient. She is wanting to be sure this was not the capsule from the capsule endoscopy. Describe the appearance of the capsule to her. Ms Burnard says this is not what he coughed up. Patient denies nausea or abdominal pain. He was just coughing per Ms Burnard. (KUB 1 view from 07/14/24 has not resulted yet)

## 2024-07-15 NOTE — Telephone Encounter (Signed)
 Message has been forwarded to the nurse.   Copied from CRM #[id number]. Topic: Appointments - Appointment Scheduling >> Jul 15, 2024  1:20 PM Carrielelia G wrote: Please call daughter she would like information on when her parents should get a flu shot, and do they need an appt?

## 2024-07-15 NOTE — Telephone Encounter (Signed)
 Return call to pt's sister,Ms Burnard, - informed we will have flu shots in August.

## 2024-07-15 NOTE — Telephone Encounter (Signed)
 Spoke with sister of the patient Leonard Tucker. She is aware of the imaging results. She cannot schedule the repeat capsule endoscopy right now. She is driving. Asks for a return call tomorrow around 10-11 am.

## 2024-07-28 ENCOUNTER — Encounter: Payer: Self-pay | Admitting: Gastroenterology

## 2024-08-10 ENCOUNTER — Ambulatory Visit (INDEPENDENT_AMBULATORY_CARE_PROVIDER_SITE_OTHER): Admitting: Gastroenterology

## 2024-08-10 ENCOUNTER — Encounter: Payer: Self-pay | Admitting: Gastroenterology

## 2024-08-10 VITALS — BP 140/80 | HR 90 | Ht 66.0 in | Wt 201.1 lb

## 2024-08-10 DIAGNOSIS — K648 Other hemorrhoids: Secondary | ICD-10-CM

## 2024-08-10 DIAGNOSIS — K641 Second degree hemorrhoids: Secondary | ICD-10-CM

## 2024-08-10 NOTE — Patient Instructions (Signed)
 HEMORRHOID BANDING PROCEDURE    FOLLOW-UP CARE   The procedure you have had should have been relatively painless since the banding of the area involved does not have nerve endings and there is no pain sensation.  The rubber band cuts off the blood supply to the hemorrhoid and the band may fall off as soon as 48 hours after the banding (the band may occasionally be seen in the toilet bowl following a bowel movement). You may notice a temporary feeling of fullness in the rectum which should respond adequately to plain Tylenol  or Motrin .  Following the banding, avoid strenuous exercise that evening and resume full activity the next day.  A sitz bath (soaking in a warm tub) or bidet is soothing, and can be useful for cleansing the area after bowel movements.     To avoid constipation, take two tablespoons of natural wheat bran, natural oat bran, flax, Benefiber or any over the counter fiber supplement and increase your water intake to 7-8 glasses daily.    Unless you have been prescribed anorectal medication, do not put anything inside your rectum for two weeks: No suppositories, enemas, fingers, etc.  Occasionally, you may have more bleeding than usual after the banding procedure.  This is often from the untreated hemorrhoids rather than the treated one.  Don't be concerned if there is a tablespoon or so of blood.  If there is more blood than this, lie flat with your bottom higher than your head and apply an ice pack to the area. If the bleeding does not stop within a half an hour or if you feel faint, call our office at [phone] or go to the emergency room.  Problems are not common; however, if there is a substantial amount of bleeding, severe pain, chills, fever or difficulty passing urine (very rare) or other problems, you should call us  at [phone] or report to the nearest emergency room.  Do not stay seated continuously for more than 2-3 hours for a day or two after the procedure.   Tighten your buttock muscles 10-15 times every two hours and take 10-15 deep breaths every 1-2 hours.  Do not spend more than a few minutes on the toilet if you cannot empty your bowel; instead re-visit the toilet at a later time.    Thank you for trusting me with your gastrointestinal care!    Dr. Victory Legrand [facility]

## 2024-08-10 NOTE — Progress Notes (Signed)
 ASL interpreter present for entire visit  Patient reports his rectal bleeding has stopped with the first two bandings.  PROCEDURE NOTE: The patient presents with symptomatic grade 2  hemorrhoids, requesting rubber band ligation of his/her hemorrhoidal disease.  All risks, benefits and alternative forms of therapy were described and informed consent was obtained.  DRE revealed: nml   The anorectum was pre-medicated with 0.125% NTG and lubricant. The decision was made to band the LL internal hemorrhoids, and the [facility] System was used to perform band ligation without complication.  Digital anorectal examination was then performed to assure proper positioning of the band, and to adjust the banded tissue as required.  The patient was discharged home without pain or other issues.  Dietary and behavioral recommendations were given and along with follow-up instructions.     The following adjunctive treatments were recommended:  none  The patient will return prn  for  follow-up and possible additional banding as required. No complications were encountered and the patient tolerated the procedure well.  [place] Victory Brand, MD

## 2024-09-10 ENCOUNTER — Ambulatory Visit (INDEPENDENT_AMBULATORY_CARE_PROVIDER_SITE_OTHER): Admitting: Student

## 2024-09-10 ENCOUNTER — Encounter: Payer: Self-pay | Admitting: Student

## 2024-09-10 VITALS — BP 129/83 | HR 75 | Temp 97.5°F | Ht 66.0 in | Wt 203.7 lb

## 2024-09-10 DIAGNOSIS — R0982 Postnasal drip: Secondary | ICD-10-CM | POA: Diagnosis not present

## 2024-09-10 DIAGNOSIS — Z23 Encounter for immunization: Secondary | ICD-10-CM

## 2024-09-10 DIAGNOSIS — E119 Type 2 diabetes mellitus without complications: Secondary | ICD-10-CM

## 2024-09-10 DIAGNOSIS — J302 Other seasonal allergic rhinitis: Secondary | ICD-10-CM | POA: Diagnosis not present

## 2024-09-10 DIAGNOSIS — I10 Essential (primary) hypertension: Secondary | ICD-10-CM | POA: Diagnosis not present

## 2024-09-10 DIAGNOSIS — E785 Hyperlipidemia, unspecified: Secondary | ICD-10-CM

## 2024-09-10 DIAGNOSIS — E1100 Type 2 diabetes mellitus with hyperosmolarity without nonketotic hyperglycemic-hyperosmolar coma (NKHHC): Secondary | ICD-10-CM | POA: Diagnosis not present

## 2024-09-10 MED ORDER — TRIAMTERENE-HCTZ 37.5-25 MG PO CAPS: 1.0000 | ORAL_CAPSULE | Freq: Every day | ORAL | 3 refills | Status: DC

## 2024-09-10 MED ORDER — SEMAGLUTIDE 7 MG PO TABS: 7.0000 mg | ORAL_TABLET | Freq: Every day | ORAL | 3 refills | Status: AC

## 2024-09-10 MED ORDER — PRAVASTATIN SODIUM 40 MG PO TABS: 40.0000 mg | ORAL_TABLET | Freq: Every day | ORAL | 3 refills | Status: AC

## 2024-09-10 MED ORDER — FLUTICASONE PROPIONATE 50 MCG/ACT NA SUSP
1.0000 | Freq: Every day | NASAL | 2 refills | Status: AC
Start: 2024-09-10 — End: ?

## 2024-09-10 MED ORDER — CETIRIZINE HCL 10 MG PO TABS: 10.0000 mg | ORAL_TABLET | Freq: Every day | ORAL | 2 refills | Status: AC

## 2024-09-10 MED ORDER — PANTOPRAZOLE SODIUM 40 MG PO TBEC: 40.0000 mg | DELAYED_RELEASE_TABLET | Freq: Every day | ORAL | 3 refills | Status: AC

## 2024-09-10 MED ORDER — ATENOLOL 25 MG PO TABS: 25.0000 mg | ORAL_TABLET | Freq: Every day | ORAL | 3 refills | Status: AC

## 2024-09-10 NOTE — Patient Instructions (Signed)
 Leonard Tucker, Kantz you for allowing me to take part in your care today.  Here are your instructions.  1. You got your Flu vaccine today. You may get fevers or chills. If you do, please take tylenol    2. Please come back in 3 months for A1c check   3. Your cholesterol looked good in May   PLEASE BRING YOUR MEDICATIONS TO EVERY APPOINTMENT  Thank you, Dr. Tobie  If you have any other questions please contact the internal medicine clinic at [phone] If it is after hours, please call the [place] [facility] at [phone] and then ask the person who picks up for the resident on call.

## 2024-09-10 NOTE — Assessment & Plan Note (Signed)
 Patient has a past medical history of hypertension.  Blood pressure medicine at this time includes triamterene -hydrochlorothiazide  37.5-25 mg daily and atenolol  25 mg daily.  Blood pressure today measuring at 129/83.  No acute concerns today.  Will continue with current management.  Most recent BMP in May 2025 within normal limits  Plan: - Continue triamterene -HCTZ 37.5-25 mg daily - Continue atenolol  25 mg daily - Follow-up in 4-months

## 2024-09-10 NOTE — Assessment & Plan Note (Signed)
 Patient has a past medical history of type 2 diabetes mellitus.  Due for eye exam.  Encourage patient to go for eye exam.  His mother states she will take him.  His last A1c was on July 2025.  His A1c was 6.4.  He has been well-controlled for well over a year now.  No acute concerns.  Plan: - Continue Rybelsus  7 mg daily - A1c at next visit

## 2024-09-10 NOTE — Progress Notes (Signed)
 CC: Follow-up appointment  HPI:  Leonard Tucker is a 68 y.o. male with past medical history of hypertension, type 2 diabetes, hyperlipidemia who presents for follow-up appointment and flu shot.  Please see assessment and plan for full HPI.  Medications: Hyperlipidemia: Pravastatin  40 mg daily Hypertension: Atenolol  25 mg daily, triamterene -hydrochlorothiazide  37.5-25 mg daily Schizophrenia: Fluphenazine  32.5 mg every 3 weeks Allergic rhinitis: Flonase  1 spray daily, cetirizine  10 mg daily Type 2 diabetes: Semaglutide  7 mg daily GERD: Protonix  20 mg daily  Past Medical History:  Diagnosis Date   Abscess of buttock, right 08/26/2022   Chronic constipation    Deaf-mutism    Dental caries    History of iron  deficiency anemia    Hx SBO 01/01/2023   Hypertriglyceridemia    Intellectual disability    Internal hemorrhoids 09/22/2020   Intertrigo    Pneumatosis of intestines 03/19/2022   Prediabetes 07/01/2023   Schizophrenia, paranoid, chronic (HCC)    Umbilical hernia    Upper respiratory infection with cough and congestion 12/22/2023     Current Outpatient Medications:    atenolol  (TENORMIN ) 25 MG tablet, Take 1 tablet (25 mg total) by mouth daily., Disp: 90 tablet, Rfl: 3   cetirizine  (ZYRTEC  ALLERGY) 10 MG tablet, Take 1 tablet (10 mg total) by mouth daily., Disp: 90 tablet, Rfl: 2   fluPHENAZine  decanoate (PROLIXIN ) 25 MG/ML injection, Inject 1.3 mLs (32.5 mg total) into the muscle every 21 ( twenty-one) days., Disp: , Rfl:    fluticasone  (FLONASE ) 50 MCG/ACT nasal spray, Place 1 spray into both nostrils daily., Disp: 48 mL, Rfl: 2   guaiFENesin  (MUCINEX ) 600 MG 12 hr tablet, Take 1 tablet (600 mg total) by mouth 2 (two) times daily., Disp: 28 tablet, Rfl: 0   pantoprazole  (PROTONIX ) 40 MG tablet, Take 1 tablet (40 mg total) by mouth daily., Disp: 90 tablet, Rfl: 3   pravastatin  (PRAVACHOL ) 40 MG tablet, Take 1 tablet (40 mg total) by mouth daily., Disp: 90 tablet, Rfl:  3   Semaglutide  7 MG TABS, Take 1 tablet (7 mg total) by mouth daily., Disp: 90 tablet, Rfl: 3   triamterene -hydrochlorothiazide  (DYAZIDE ) 37.5-25 MG capsule, Take 1 each (1 capsule total) by mouth daily., Disp: 90 capsule, Rfl: 3  Review of Systems:   Negative except for what is stated in HPI.  Physical Exam:  Vitals:   09/10/24 0920  BP: 129/83  Pulse: 75  Temp: (!) 97.5 F (36.4 C)  TempSrc: Oral  SpO2: 96%  Weight: 203 lb 11.2 oz (92.4 kg)  Height: 5' 6 (1.676 m)   General: Patient is sitting comfortably in the room  Head: Normocephalic, atraumatic  Cardio: Regular rate and rhythm, no murmurs, rubs or gallops. Pulmonary: Clear to ausculation bilaterally with no rales, rhonchi, and crackles  Back: Lipoma noted on right upper back, picture in chart   Assessment & Plan:   Assessment & Plan Encounter for immunization Flu vaccine administered today. Essential hypertension Patient has a past medical history of hypertension.  Blood pressure medicine at this time includes triamterene -hydrochlorothiazide  37.5-25 mg daily and atenolol  25 mg daily.  Blood pressure today measuring at 129/83.  No acute concerns today.  Will continue with current management.  Most recent BMP in May 2025 within normal limits  Plan: - Continue triamterene -HCTZ 37.5-25 mg daily - Continue atenolol  25 mg daily - Follow-up in 2-months Type 2 diabetes mellitus with hyperosmolarity without coma, without long-term current use of insulin  [name]) Patient has a past medical history of  type 2 diabetes mellitus.  Due for eye exam.  Encourage patient to go for eye exam.  His mother states she will take him.  His last A1c was on July 2025.  His A1c was 6.4.  He has been well-controlled for well over a year now.  No acute concerns.  Plan: - Continue Rybelsus  7 mg daily - A1c at next visit Seasonal allergies Patient does have watery eyes, and his mother notes that he develops allergies around this time of the  season.  Plan: - Refill cetirizine  - Refill Flonase , instructed patient to use Flonase  Dyslipidemia No acute concerns today.  Most recently had lipid panel checked on Apr 30, 2024.  LDL at goal at 50.  Plan: - Continue pravastatin  40 mg daily   Patient discussed with Dr. Shawn Libby Blanch, DO Internal Medicine Resident PGY-3

## 2024-09-10 NOTE — Progress Notes (Signed)
 Internal Medicine Clinic Attending  Case discussed with the resident at the time of the visit.  We reviewed the resident's history and exam and pertinent patient test results.  I agree with the assessment, diagnosis, and plan of care documented in the resident's note.

## 2024-09-10 NOTE — Assessment & Plan Note (Signed)
 Patient does have watery eyes, and his mother notes that he develops allergies around this time of the season.  Plan: - Refill cetirizine  - Refill Flonase , instructed patient to use Flonase 

## 2024-11-08 NOTE — Addendum Note (Signed)
 Addended by: DAYNE SHERRY RAMAN on: 11/08/2024 10:07 AM   Modules accepted: Orders

## 2024-11-24 ENCOUNTER — Telehealth: Payer: Self-pay

## 2024-11-24 NOTE — Telephone Encounter (Signed)
 Prior Authorization for patient (Rybelsus  3MG  tablets) came through on cover my meds was submitted with last office notes and labs awaiting approval or denial.  XZB:[id number]

## 2024-11-29 NOTE — Telephone Encounter (Signed)
 Leonard Tucker (Key: [id number]) Rybelsus  3MG  tablets Form [employer] Medicare Part D Electronic Prior Authorization Form (2017 NCPDP) Created 12 days ago Sent to Plan 5 days ago Plan Response 5 days ago Submit Clinical Questions 5 days ago Determination Favorable 5 days ago Message from Plan Request Reference Number: [id number]. RYBELSUS  TAB 3MG  is approved through 12/01/2025. Your patient may now fill this prescription and it will be covered.. Authorization Expiration Date: December 01, 2025.

## 2024-12-13 ENCOUNTER — Encounter: Payer: Self-pay | Admitting: Student

## 2024-12-13 ENCOUNTER — Ambulatory Visit (INDEPENDENT_AMBULATORY_CARE_PROVIDER_SITE_OTHER): Admitting: Student

## 2024-12-13 ENCOUNTER — Other Ambulatory Visit: Payer: Self-pay

## 2024-12-13 VITALS — BP 118/80 | HR 88 | Temp 97.5°F | Ht 66.0 in | Wt 200.0 lb

## 2024-12-13 DIAGNOSIS — D509 Iron deficiency anemia, unspecified: Secondary | ICD-10-CM | POA: Diagnosis not present

## 2024-12-13 DIAGNOSIS — K219 Gastro-esophageal reflux disease without esophagitis: Secondary | ICD-10-CM | POA: Diagnosis not present

## 2024-12-13 DIAGNOSIS — E119 Type 2 diabetes mellitus without complications: Secondary | ICD-10-CM

## 2024-12-13 DIAGNOSIS — E1169 Type 2 diabetes mellitus with other specified complication: Secondary | ICD-10-CM

## 2024-12-13 DIAGNOSIS — Z7984 Long term (current) use of oral hypoglycemic drugs: Secondary | ICD-10-CM

## 2024-12-13 DIAGNOSIS — I10 Essential (primary) hypertension: Secondary | ICD-10-CM | POA: Diagnosis not present

## 2024-12-13 DIAGNOSIS — E1100 Type 2 diabetes mellitus with hyperosmolarity without nonketotic hyperglycemic-hyperosmolar coma (NKHHC): Secondary | ICD-10-CM

## 2024-12-13 DIAGNOSIS — D500 Iron deficiency anemia secondary to blood loss (chronic): Secondary | ICD-10-CM

## 2024-12-13 LAB — POCT GLYCOSYLATED HEMOGLOBIN (HGB A1C): HbA1c, POC (controlled diabetic range): 6 % (ref 0.0–7.0)

## 2024-12-13 LAB — GLUCOSE, CAPILLARY: Glucose-Capillary: 96 mg/dL (ref 70–99)

## 2024-12-13 MED ORDER — EMPAGLIFLOZIN 10 MG PO TABS: 10.0000 mg | ORAL_TABLET | Freq: Every day | ORAL | 2 refills | Status: AC

## 2024-12-13 NOTE — Patient Instructions (Signed)
 Thank you, Leonard Tucker for allowing us  to provide your care today. Today we discussed your health.  Lab Results  Component Value Date   HGBA1C 6.0 12/13/2024   HGBA1C 6.4 (A) 06/30/2024   HGBA1C 6.5 (A) 03/18/2024    Your A1c is great today. I am stopping your   triamterene -hydrochlorothiazide  (DYAZIDE ) 37.5-25 MG capsule today.I am replacing it with Jardiance  10 mg Daily. Continue to your atenolol . Hold your Protonix  and resume only if your reflux returns   I scheduled you are an eye exam. They will call you for that appointment .   I have ordered the following labs for you:  Lab Orders         Glucose, capillary         Microalbumin / Creatinine Urine Ratio         POC Hbg A1C      Tests ordered today:    Referrals ordered today:   Referral Orders         Ambulatory referral to Ophthalmology      I have ordered the following medication/changed the following medications:   Stop the following medications: There are no discontinued medications.   Start the following medications: Meds ordered this encounter  Medications   empagliflozin  (JARDIANCE ) 10 MG TABS tablet    Sig: Take 1 tablet (10 mg total) by mouth daily before breakfast.    Dispense:  60 tablet    Refill:  2     Follow up: 3 months     Should you have any questions or concerns please call the internal medicine clinic at [phone].   Drue Lisa Grow MD 12/13/2024, 9:59 AM   [facility]

## 2024-12-13 NOTE — Progress Notes (Unsigned)
 Internal Medicine Clinic Attending  Case discussed with the resident at the time of the visit.  We reviewed the resident's history and exam and pertinent patient test results.  I agree with the assessment, diagnosis, and plan of care documented in the resident's note.

## 2024-12-13 NOTE — Progress Notes (Unsigned)
 " Dr Shawn  CC: 3 months diabetes follow-up  He requires sign language for communication   LOV 09/10/2024 and here for a 3 months follow up   HTN: BP is 118/80 today., triamterene -HCTZ 37.5-25 mg daily  atenolol  25 mg daily.  Well controlled DM : A1C this morning at goal of 6 from 6.4 which is reassuring.  Doing a foot exam today .  Last ACR with ratio elevated at 34  last year we will repeat that today.  I do not see any contraindication or allergies to ACE inhibitor.  If his repeat ACR continues to be concerning, I will consider initiating ACE inhibitor and stopping.  For now I am continuing semaglutide  7 mg daily.  He had an eye exam scheduled for July last year but was not able to make it.  I will reschedule that today.  Iron  deficiency anemia: Last iron  studies in May last year was within normal limit.  Stable, will continue to monitor  GERD: He takes pantoprazole  40 mg daily since 2023 .Normal duodenal and gastric mucosa on 03/2023 mucosa. I wonder if his symptoms are resolved due to the mdication or not. I want to trial him off the protonox for a week and let us  know if his symptoms returns or not.   Care gaps - Eye exam   HPI:  [name] is a 69 y.o. male living with a history stated below and presents today for ***. Please see problem based assessment and plan for additional details.  Past Medical History:  Diagnosis Date   Abscess of buttock, right 08/26/2022   Chronic constipation    Deaf-mutism    Dental caries    History of iron  deficiency anemia    Hx SBO 01/01/2023   Hypertriglyceridemia    Intellectual disability    Internal hemorrhoids 09/22/2020   Intertrigo    Pneumatosis of intestines 03/19/2022   Prediabetes 07/01/2023   Schizophrenia, paranoid, chronic (HCC)    Umbilical hernia    Upper respiratory infection with cough and congestion 12/22/2023    Medications Ordered Prior to Encounter[1]  Family History  Problem Relation Age of Onset    Hypertension Mother    Diabetes Maternal Aunt    Colon cancer Neg Hx    Rectal cancer Neg Hx    Stomach cancer Neg Hx    Esophageal cancer Neg Hx     Social History   Socioeconomic History   Marital status: Single    Spouse name: Not on file   Number of children: Not on file   Years of education: Not on file   Highest education level: Not on file  Occupational History   Not on file  Tobacco Use   Smoking status: Never   Smokeless tobacco: Never  Substance and Sexual Activity   Alcohol  use: No    Alcohol /week: 0.0 standard drinks of alcohol    Drug use: No   Sexual activity: Not on file  Other Topics Concern   Not on file  Social History Narrative   His mother, [name] is his primary caretaker and he lives with her.    Communicates via sign language          Social Drivers of Health   Tobacco Use: Low Risk (12/13/2024)   Patient History    Smoking Tobacco Use: Never    Smokeless Tobacco Use: Never    Passive Exposure: Not on file  Financial Resource Strain: Low Risk (12/18/2023)   Overall Financial Resource Strain (  CARDIA)    Difficulty of Paying Living Expenses: Not very hard  Food Insecurity: No Food Insecurity (12/18/2023)   Hunger Vital Sign    Worried About Running Out of Food in the Last Year: Never true    Ran Out of Food in the Last Year: Never true  Transportation Needs: No Transportation Needs (12/18/2023)   PRAPARE - Administrator, Civil Service (Medical): No    Lack of Transportation (Non-Medical): No  Physical Activity: Inactive (12/18/2023)   Exercise Vital Sign    Days of Exercise per Week: 0 days    Minutes of Exercise per Session: 0 min  Stress: No Stress Concern Present (12/18/2023)   [employer] of Occupational Health - Occupational Stress Questionnaire    Feeling of Stress : Not at all  Social Connections: Moderately Isolated (12/18/2023)   Social Connection and Isolation Panel    Frequency of Communication with Friends and  Family: More than three times a week    Frequency of Social Gatherings with Friends and Family: More than three times a week    Attends Religious Services: 1 to 4 times per year    Active Member of [employer] or Organizations: No    Attends Banker Meetings: Never    Marital Status: Never married  Intimate Partner Violence: Not At Risk (12/18/2023)   Humiliation, Afraid, Rape, and Kick questionnaire    Fear of Current or Ex-Partner: No    Emotionally Abused: No    Physically Abused: No    Sexually Abused: No  Depression (PHQ2-9): Low Risk (12/13/2024)   Depression (PHQ2-9)    PHQ-2 Score: 0  Alcohol  Screen: Low Risk (12/18/2023)   Alcohol  Screen    Last Alcohol  Screening Score (AUDIT): 0  Housing: Unknown (12/18/2023)   Housing Stability Vital Sign    Unable to Pay for Housing in the Last Year: No    Number of Times Moved in the Last Year: Not on file    Homeless in the Last Year: No  Utilities: Not At Risk (12/18/2023)   AHC Utilities    Threatened with loss of utilities: No  Health Literacy: Adequate Health Literacy (12/18/2023)   B1300 Health Literacy    Frequency of need for help with medical instructions: Never    Review of Systems: ROS negative except for what is noted on the assessment and plan.  Vitals:   12/13/24 0914  BP: 118/80  Pulse: 88  Temp: (!) 97.5 F (36.4 C)  TempSrc: Oral  SpO2: 100%  Weight: 200 lb (90.7 kg)  Height: 5' 6 (1.676 m)    Physical Exam: Constitutional: well-appearing *** sitting in ***, in no acute distress HENT: normocephalic atraumatic, mucous membranes moist Eyes: conjunctiva non-erythematous Cardiovascular: regular rate and rhythm, no m/r/g Pulmonary/Chest: normal work of breathing on room air, lungs clear to auscultation bilaterally Abdominal: soft, non-tender, non-distended MSK: normal bulk and tone Neurological: alert & oriented x 3, no focal deficit Skin: warm and dry Psych: normal mood and behavior  Assessment &  Plan:   No problem-specific Assessment & Plan notes found for this encounter.     Patient {GC/[id number]::discussed with,seen with} Dr. {[id number]::Tpoopjfd,Z. Hoffman,Mullen,Narendra,Vincent,Guilloud,Lau,Machen}    Drue Grow, M.D [facility] Phone: [phone] Date 12/13/2024 Time 9:56 AM      [1]  Current Outpatient Medications on File Prior to Visit  Medication Sig Dispense Refill   atenolol  (TENORMIN ) 25 MG tablet Take 1 tablet (25 mg total) by mouth daily. 90 tablet 3  cetirizine  (ZYRTEC  ALLERGY) 10 MG tablet Take 1 tablet (10 mg total) by mouth daily. 90 tablet 2   fluPHENAZine  decanoate (PROLIXIN ) 25 MG/ML injection Inject 1.3 mLs (32.5 mg total) into the muscle every 21 ( twenty-one) days.     fluticasone  (FLONASE ) 50 MCG/ACT nasal spray Place 1 spray into both nostrils daily. 48 mL 2   pantoprazole  (PROTONIX ) 40 MG tablet Take 1 tablet (40 mg total) by mouth daily. 90 tablet 3   pravastatin  (PRAVACHOL ) 40 MG tablet Take 1 tablet (40 mg total) by mouth daily. 90 tablet 3   Semaglutide  7 MG TABS Take 1 tablet (7 mg total) by mouth daily. 90 tablet 3   triamterene -hydrochlorothiazide  (DYAZIDE ) 37.5-25 MG capsule Take 1 each (1 capsule total) by mouth daily. 90 capsule 3   No current facility-administered medications on file prior to visit.   "

## 2024-12-14 LAB — MICROALBUMIN / CREATININE URINE RATIO
Creatinine, Urine: 272.5 mg/dL
Microalb/Creat Ratio: 38 mg/g{creat} — ABNORMAL HIGH (ref 0–29)
Microalbumin, Urine: 103 ug/mL

## 2024-12-14 NOTE — Assessment & Plan Note (Addendum)
 Diabetes mellitus, well controlled: A1c this morning is at goal at 6.0%, improved from 6.4%, which is reassuring. Foot exam will be performed today. Last urine ACR showed a mildly elevated ratio of 34 last year; this will be repeated today. I do not see any contraindications or allergies to ACE inhibitors. If the repeat ACR remains elevated, I will consider initiating an ACE inhibitor at that time. For now, will continue semaglutide  7 mg daily. Patient had an eye exam scheduled for July of last year but was unable to attend; this will be rescheduled today. Urine ACR one year ago showed an elevated ratio of 34. Will initiate empagliflozin   today and repeat urine ACR. - AUC today - Foot exam - Referral to ophthalmologist  - Start Jardiance  10 mg Daily

## 2024-12-14 NOTE — Assessment & Plan Note (Signed)
 The patient has been taking pantoprazole  40 mg daily since 2023. EGD in April 2024 showed normal duodenal and gastric mucosa. It is unclear whether his symptoms have resolved due to the medication. Will trial discontinuation of pantoprazole  for one week and ask the patient to report whether symptoms recur.

## 2024-12-14 NOTE — Assessment & Plan Note (Addendum)
 BP Readings from Last 3 Encounters:  12/13/24 118/80  09/10/24 129/83  08/10/24 (!) 140/80   Blood pressure is well controlled today at 118/80 mmHg with current regimen of triamterene -HCTZ 37.5/25 mg daily and atenolol  25 mg daily.I am discontinuing your triamterene -HCTZ 37.5/25 mg. Continue your atenolol  - Continue atenolol  - STOP triamterene -HCTZ 37.5/25 mg

## 2024-12-14 NOTE — Assessment & Plan Note (Signed)
 Iron  deficiency anemia: Stable. Iron  studies (May last year) within normal limits. Continue routine monitoring.

## 2024-12-28 ENCOUNTER — Telehealth: Payer: Self-pay | Admitting: *Deleted

## 2024-12-28 NOTE — Telephone Encounter (Signed)
 RTC to patient's mother.  Message was left that the Clinics had returned her call about the patient's medications.          Copied from CRM #[id number]. Topic: Clinical - Medication Question >> Dec 27, 2024  1:51 PM Graeme ORN wrote: Reason for CRM: Patient sister called. States patient mom is confused on medications he should and should not be taking. Would like to get an accurate list. Would like a call back to go over medications. Thank You

## 2024-12-30 ENCOUNTER — Other Ambulatory Visit: Payer: Self-pay | Admitting: *Deleted

## 2024-12-30 DIAGNOSIS — F200 Paranoid schizophrenia: Secondary | ICD-10-CM

## 2024-12-30 MED ORDER — FLUPHENAZINE DECANOATE 25 MG/ML IJ SOLN: 32.5000 mg | INTRAMUSCULAR | 3 refills | Status: AC

## 2024-12-30 NOTE — Telephone Encounter (Signed)
 There are refills on Jardiance , Atenolol , and Zyrtec ; called pt / no answer. Left message to call the pharmacy.

## 2024-12-30 NOTE — Telephone Encounter (Signed)
 Copied from CRM #[id number]. Topic: Clinical - Medication Refill >> Dec 30, 2024 11:36 AM Debby BROCKS wrote: Medication: empagliflozin  (JARDIANCE ) 10 MG TABS tablet fluPHENAZine  decanoate (PROLIXIN ) 25 MG/ML injection atenolol  (TENORMIN ) 25 MG tablet cetirizine  (ZYRTEC  ALLERGY) 10 MG tablet   Has the patient contacted their pharmacy? No (Agent: If no, request that the patient contact the pharmacy for the refill. If patient does not wish to contact the pharmacy document the reason why and proceed with request.) (Agent: If yes, when and what did the pharmacy advise?)  This is the patient's preferred pharmacy:  [id number] [place], [place] - [address] AT [address] [place] [address] Phone: [phone] Fax: [phone]  Is this the correct pharmacy for this prescription? Yes If no, delete pharmacy and type the correct one.   Has the prescription been filled recently? No  Is the patient out of the medication? Yes, Patient is out of almost all of them, and has 2-3 pills left of others  Has the patient been seen for an appointment in the last year OR does the patient have an upcoming appointment? Yes  Can we respond through MyChart? No  Agent: Please be advised that Rx refills may take up to 3 business days. We ask that you follow-up with your pharmacy.
